# Patient Record
Sex: Female | Born: 1940 | Race: White | Hispanic: No | Smoking: Never smoker
Health system: Southern US, Community
[De-identification: ages and names within clinical notes are randomized; demographics above are authoritative.]

## PROBLEM LIST (undated history)

## (undated) DIAGNOSIS — M1990 Unspecified osteoarthritis, unspecified site: Secondary | ICD-10-CM

## (undated) DIAGNOSIS — F4310 Post-traumatic stress disorder, unspecified: Secondary | ICD-10-CM

## (undated) DIAGNOSIS — I509 Heart failure, unspecified: Secondary | ICD-10-CM

## (undated) DIAGNOSIS — M329 Systemic lupus erythematosus, unspecified: Secondary | ICD-10-CM

## (undated) DIAGNOSIS — I10 Essential (primary) hypertension: Secondary | ICD-10-CM

## (undated) DIAGNOSIS — IMO0002 Reserved for concepts with insufficient information to code with codable children: Secondary | ICD-10-CM

## (undated) DIAGNOSIS — I38 Endocarditis, valve unspecified: Secondary | ICD-10-CM

## (undated) DIAGNOSIS — M797 Fibromyalgia: Secondary | ICD-10-CM

## (undated) DIAGNOSIS — M069 Rheumatoid arthritis, unspecified: Secondary | ICD-10-CM

## (undated) DIAGNOSIS — K559 Vascular disorder of intestine, unspecified: Secondary | ICD-10-CM

## (undated) HISTORY — DX: Endocarditis, valve unspecified: I38

## (undated) HISTORY — DX: Reserved for concepts with insufficient information to code with codable children: IMO0002

## (undated) HISTORY — DX: Unspecified osteoarthritis, unspecified site: M19.90

## (undated) HISTORY — DX: Fibromyalgia: M79.7

## (undated) HISTORY — DX: Heart failure, unspecified: I50.9

## (undated) HISTORY — DX: Rheumatoid arthritis, unspecified: M06.9

## (undated) HISTORY — DX: Post-traumatic stress disorder, unspecified: F43.10

## (undated) HISTORY — DX: Systemic lupus erythematosus, unspecified: M32.9

## (undated) HISTORY — DX: Essential (primary) hypertension: I10

## (undated) HISTORY — PX: TONSILLECTOMY: SUR1361

---

## 2007-07-10 ENCOUNTER — Ambulatory Visit: Payer: Self-pay | Admitting: Internal Medicine

## 2007-10-05 ENCOUNTER — Ambulatory Visit: Payer: Self-pay | Admitting: Internal Medicine

## 2008-07-18 ENCOUNTER — Ambulatory Visit: Payer: Self-pay | Admitting: Pain Medicine

## 2008-08-02 ENCOUNTER — Ambulatory Visit: Payer: Self-pay | Admitting: Pain Medicine

## 2008-10-25 ENCOUNTER — Emergency Department: Payer: Self-pay | Admitting: Emergency Medicine

## 2008-10-26 ENCOUNTER — Ambulatory Visit: Payer: Self-pay | Admitting: Internal Medicine

## 2009-03-08 ENCOUNTER — Ambulatory Visit: Payer: Self-pay | Admitting: Pain Medicine

## 2009-03-21 ENCOUNTER — Ambulatory Visit: Payer: Self-pay | Admitting: Pain Medicine

## 2009-04-16 ENCOUNTER — Ambulatory Visit: Payer: Self-pay | Admitting: Pain Medicine

## 2009-08-13 ENCOUNTER — Ambulatory Visit: Payer: Self-pay | Admitting: Pain Medicine

## 2010-08-27 ENCOUNTER — Emergency Department: Payer: Self-pay | Admitting: Emergency Medicine

## 2010-10-17 ENCOUNTER — Emergency Department: Payer: Self-pay | Admitting: Emergency Medicine

## 2011-11-11 LAB — URINALYSIS, COMPLETE
Granular Cast: 2
Hyaline Cast: 22
Ketone: NEGATIVE
Nitrite: NEGATIVE
Ph: 5 (ref 4.5–8.0)
Protein: 30
Specific Gravity: 1.017 (ref 1.003–1.030)
WBC UR: 14 /HPF (ref 0–5)

## 2011-11-11 LAB — COMPREHENSIVE METABOLIC PANEL
Albumin: 3.6 g/dL (ref 3.4–5.0)
Anion Gap: 10 (ref 7–16)
BUN: 33 mg/dL — ABNORMAL HIGH (ref 7–18)
Bilirubin,Total: 0.4 mg/dL (ref 0.2–1.0)
Chloride: 104 mmol/L (ref 98–107)
Co2: 23 mmol/L (ref 21–32)
Creatinine: 1.36 mg/dL — ABNORMAL HIGH (ref 0.60–1.30)
EGFR (African American): 46 — ABNORMAL LOW
Glucose: 239 mg/dL — ABNORMAL HIGH (ref 65–99)
Potassium: 4.6 mmol/L (ref 3.5–5.1)
SGOT(AST): 19 U/L (ref 15–37)
Total Protein: 6.9 g/dL (ref 6.4–8.2)

## 2011-11-11 LAB — CBC
MCH: 31.9 pg (ref 26.0–34.0)
MCHC: 33.1 g/dL (ref 32.0–36.0)
Platelet: 313 10*3/uL (ref 150–440)
RBC: 4.63 10*6/uL (ref 3.80–5.20)
WBC: 29.2 10*3/uL — ABNORMAL HIGH (ref 3.6–11.0)

## 2011-11-11 LAB — APTT: Activated PTT: 26 secs (ref 23.6–35.9)

## 2011-11-11 LAB — PROTIME-INR: Prothrombin Time: 12.9 secs (ref 11.5–14.7)

## 2011-11-11 LAB — TROPONIN I: Troponin-I: <0.02 ng/mL

## 2011-11-12 ENCOUNTER — Inpatient Hospital Stay: Payer: Self-pay | Admitting: Internal Medicine

## 2011-11-12 LAB — BASIC METABOLIC PANEL
BUN: 20 mg/dL — ABNORMAL HIGH (ref 7–18)
Calcium, Total: 8.1 mg/dL — ABNORMAL LOW (ref 8.5–10.1)
Chloride: 108 mmol/L — ABNORMAL HIGH (ref 98–107)
Co2: 25 mmol/L (ref 21–32)
Creatinine: 0.96 mg/dL (ref 0.60–1.30)
EGFR (African American): >60
Glucose: 117 mg/dL — ABNORMAL HIGH (ref 65–99)
Sodium: 139 mmol/L (ref 136–145)

## 2011-11-12 LAB — CLOSTRIDIUM DIFFICILE BY PCR

## 2011-11-13 LAB — COMPREHENSIVE METABOLIC PANEL
Albumin: 2.6 g/dL — ABNORMAL LOW (ref 3.4–5.0)
Alkaline Phosphatase: 86 U/L (ref 50–136)
Anion Gap: 9 (ref 7–16)
BUN: 9 mg/dL (ref 7–18)
Calcium, Total: 7.9 mg/dL — ABNORMAL LOW (ref 8.5–10.1)
Co2: 23 mmol/L (ref 21–32)
Glucose: 135 mg/dL — ABNORMAL HIGH (ref 65–99)
Osmolality: 282 (ref 275–301)
Potassium: 4.1 mmol/L (ref 3.5–5.1)
SGPT (ALT): 13 U/L (ref 12–78)
Sodium: 141 mmol/L (ref 136–145)

## 2011-11-13 LAB — CBC WITH DIFFERENTIAL/PLATELET
Basophil %: 0.2 %
Eosinophil %: 0.6 %
Lymphocyte #: 2.2 10*3/uL (ref 1.0–3.6)
Lymphocyte %: 13.3 %
MCH: 31.5 pg (ref 26.0–34.0)
MCHC: 33.2 g/dL (ref 32.0–36.0)
Monocyte #: 1.3 x10 3/mm — ABNORMAL HIGH (ref 0.2–0.9)
Neutrophil #: 13.2 10*3/uL — ABNORMAL HIGH (ref 1.4–6.5)
Neutrophil %: 78.1 %
Platelet: 220 10*3/uL (ref 150–440)

## 2011-11-13 LAB — HEMOGLOBIN: HGB: 12.8 g/dL (ref 12.0–16.0)

## 2011-11-14 LAB — BASIC METABOLIC PANEL
Anion Gap: 7 (ref 7–16)
Chloride: 109 mmol/L — ABNORMAL HIGH (ref 98–107)
Co2: 25 mmol/L (ref 21–32)
Creatinine: 0.79 mg/dL (ref 0.60–1.30)
EGFR (African American): >60
Osmolality: 279 (ref 275–301)
Potassium: 3.5 mmol/L (ref 3.5–5.1)

## 2011-11-14 LAB — CBC WITH DIFFERENTIAL/PLATELET
Basophil %: 0.4 %
Eosinophil #: 0.3 10*3/uL (ref 0.0–0.7)
Eosinophil %: 2 %
HCT: 33.2 % — ABNORMAL LOW (ref 35.0–47.0)
HGB: 11 g/dL — ABNORMAL LOW (ref 12.0–16.0)
Lymphocyte #: 2.7 10*3/uL (ref 1.0–3.6)
Lymphocyte %: 18.4 %
MCHC: 33.2 g/dL (ref 32.0–36.0)
MCV: 95 fL (ref 80–100)
Monocyte %: 7.3 %
Neutrophil #: 10.6 10*3/uL — ABNORMAL HIGH (ref 1.4–6.5)
Neutrophil %: 71.9 %
RBC: 3.51 10*6/uL — ABNORMAL LOW (ref 3.80–5.20)
RDW: 12.7 % (ref 11.5–14.5)
WBC: 14.7 10*3/uL — ABNORMAL HIGH (ref 3.6–11.0)

## 2011-11-14 LAB — PROTIME-INR: Prothrombin Time: 14.5 secs (ref 11.5–14.7)

## 2011-11-15 LAB — CBC WITH DIFFERENTIAL/PLATELET
Basophil %: 0.4 %
HCT: 30.9 % — ABNORMAL LOW (ref 35.0–47.0)
HGB: 10.4 g/dL — ABNORMAL LOW (ref 12.0–16.0)
Lymphocyte #: 2.3 10*3/uL (ref 1.0–3.6)
Lymphocyte %: 20.4 %
Monocyte %: 6.8 %
Neutrophil #: 7.4 10*3/uL — ABNORMAL HIGH (ref 1.4–6.5)
Platelet: 217 10*3/uL (ref 150–440)
RDW: 12.3 % (ref 11.5–14.5)

## 2011-11-15 LAB — BASIC METABOLIC PANEL
Anion Gap: 11 (ref 7–16)
BUN: 5 mg/dL — ABNORMAL LOW (ref 7–18)
Creatinine: 0.65 mg/dL (ref 0.60–1.30)
EGFR (African American): >60
Glucose: 114 mg/dL — ABNORMAL HIGH (ref 65–99)

## 2011-11-19 LAB — BASIC METABOLIC PANEL
BUN: 11 mg/dL (ref 7–18)
Co2: 25 mmol/L (ref 21–32)
Glucose: 95 mg/dL (ref 65–99)
Sodium: 144 mmol/L (ref 136–145)

## 2011-11-19 LAB — CBC WITH DIFFERENTIAL/PLATELET
Basophil #: 0.1 10*3/uL (ref 0.0–0.1)
Basophil %: 0.5 %
Eosinophil #: 0.4 10*3/uL (ref 0.0–0.7)
Eosinophil %: 3.9 %
HCT: 30 % — ABNORMAL LOW (ref 35.0–47.0)
HGB: 10.4 g/dL — ABNORMAL LOW (ref 12.0–16.0)
Lymphocyte #: 2.3 10*3/uL (ref 1.0–3.6)
Lymphocyte %: 23.6 %
MCH: 33 pg (ref 26.0–34.0)
MCHC: 34.7 g/dL (ref 32.0–36.0)
Monocyte #: 0.8 x10 3/mm (ref 0.2–0.9)
Neutrophil #: 6.2 10*3/uL (ref 1.4–6.5)
Neutrophil %: 63.9 %
RDW: 12.8 % (ref 11.5–14.5)

## 2011-11-19 LAB — PATHOLOGY REPORT

## 2011-11-23 ENCOUNTER — Emergency Department: Payer: Self-pay | Admitting: Emergency Medicine

## 2011-11-23 LAB — COMPREHENSIVE METABOLIC PANEL
Anion Gap: 9 (ref 7–16)
BUN: 13 mg/dL (ref 7–18)
Calcium, Total: 8.4 mg/dL — ABNORMAL LOW (ref 8.5–10.1)
Chloride: 111 mmol/L — ABNORMAL HIGH (ref 98–107)
Co2: 25 mmol/L (ref 21–32)
EGFR (African American): >60
EGFR (Non-African Amer.): >60
Glucose: 134 mg/dL — ABNORMAL HIGH (ref 65–99)
Osmolality: 291 (ref 275–301)
Potassium: 3.7 mmol/L (ref 3.5–5.1)
Sodium: 145 mmol/L (ref 136–145)

## 2011-11-23 LAB — CBC
HGB: 12.8 g/dL (ref 12.0–16.0)
MCHC: 34.6 g/dL (ref 32.0–36.0)
RBC: 3.9 10*6/uL (ref 3.80–5.20)
WBC: 7.5 10*3/uL (ref 3.6–11.0)

## 2013-01-14 ENCOUNTER — Ambulatory Visit: Payer: Self-pay | Admitting: Internal Medicine

## 2013-07-05 ENCOUNTER — Ambulatory Visit: Payer: Self-pay | Admitting: Pain Medicine

## 2013-09-06 ENCOUNTER — Ambulatory Visit: Payer: Self-pay | Admitting: Pain Medicine

## 2013-09-12 ENCOUNTER — Ambulatory Visit: Payer: Self-pay | Admitting: Pain Medicine

## 2013-09-20 ENCOUNTER — Emergency Department: Payer: Self-pay | Admitting: Emergency Medicine

## 2013-09-20 LAB — COMPREHENSIVE METABOLIC PANEL
ALBUMIN: 3.4 g/dL (ref 3.4–5.0)
ALK PHOS: 104 U/L
ALT: 17 U/L
Anion Gap: 7 (ref 7–16)
BUN: 18 mg/dL (ref 7–18)
Bilirubin,Total: 0.3 mg/dL (ref 0.2–1.0)
CALCIUM: 8.8 mg/dL (ref 8.5–10.1)
Chloride: 108 mmol/L — ABNORMAL HIGH (ref 98–107)
Co2: 27 mmol/L (ref 21–32)
Creatinine: 0.86 mg/dL (ref 0.60–1.30)
EGFR (African American): >60
EGFR (Non-African Amer.): >60
Glucose: 92 mg/dL (ref 65–99)
Osmolality: 285 (ref 275–301)
Potassium: 3.8 mmol/L (ref 3.5–5.1)
SGOT(AST): 18 U/L (ref 15–37)
Sodium: 142 mmol/L (ref 136–145)
Total Protein: 7.1 g/dL (ref 6.4–8.2)

## 2013-09-20 LAB — CBC
HCT: 42.1 % (ref 35.0–47.0)
HGB: 13.4 g/dL (ref 12.0–16.0)
MCH: 30.8 pg (ref 26.0–34.0)
MCHC: 31.8 g/dL — AB (ref 32.0–36.0)
MCV: 97 fL (ref 80–100)
Platelet: 300 10*3/uL (ref 150–440)
RBC: 4.34 10*6/uL (ref 3.80–5.20)
RDW: 13.4 % (ref 11.5–14.5)
WBC: 9.7 10*3/uL (ref 3.6–11.0)

## 2013-09-20 LAB — TROPONIN I: Troponin-I: <0.02 ng/mL

## 2013-10-11 ENCOUNTER — Ambulatory Visit: Payer: Self-pay | Admitting: Pain Medicine

## 2013-10-25 ENCOUNTER — Emergency Department: Payer: Self-pay | Admitting: Emergency Medicine

## 2013-11-16 ENCOUNTER — Ambulatory Visit: Payer: Self-pay | Admitting: Pain Medicine

## 2013-11-16 LAB — PROTIME-INR
INR: 0.9
Prothrombin Time: 12.5 secs (ref 11.5–14.7)

## 2013-11-16 LAB — APTT: Activated PTT: 27.4 secs (ref 23.6–35.9)

## 2013-12-13 ENCOUNTER — Ambulatory Visit: Payer: Self-pay | Admitting: Pain Medicine

## 2013-12-14 ENCOUNTER — Ambulatory Visit: Payer: Self-pay | Admitting: Pain Medicine

## 2014-06-06 NOTE — Consult Note (Signed)
Chief Complaint:   Subjective/Chief Complaint doing well, tolerating low residue. no abdominal pain. one bm no bleeding   VITAL SIGNS/ANCILLARY NOTES: **Vital Signs.:   29-Sep-13 11:24   Vital Signs Type Q 4hr   Temperature Temperature (F) 99.2   Celsius 37.3   Temperature Source oral   Pulse Pulse 86   Respirations Respirations 16   Systolic BP Systolic BP 142   Diastolic BP (mmHg) Diastolic BP (mmHg) 83   Mean BP 102   Pulse Ox % Pulse Ox % 95   Pulse Ox Activity Level  At rest   Oxygen Delivery Room Air/ 21 %   Brief Assessment:   Cardiac Regular    Respiratory clear BS    Gastrointestinal details normal Soft  Nontender  Nondistended  No masses palpable  Bowel sounds normal   Assessment/Plan:  Assessment/Plan:   Assessment 1) hematochezia with abnormal ct.  Flexible sigmoidoscopy c/w apperance of ischemic colitis.  Awaiting biopsies.    Plan 1) will s/o.  Will need to see patient as op in about 2 weeks to arrange for o/p colonoscopy to be done in about 5 weeks.   Electronic Signatures: Barnetta ChapelSkulskie, Martin (MD)  (Signed 29-Sep-13 13:35)  Authored: Chief Complaint, VITAL SIGNS/ANCILLARY NOTES, Brief Assessment, Assessment/Plan   Last Updated: 29-Sep-13 13:35 by Barnetta ChapelSkulskie, Martin (MD)

## 2014-06-06 NOTE — Consult Note (Signed)
PATIENT NAME:  [name], [name] MR#:  [id number] DATE OF BIRTH:  [birth year]  DATE OF CONSULTATION:  11/12/2011  REFERRING PHYSICIAN:   CONSULTING PHYSICIAN:  Keturah Barrehristiane H. Satcha Storlie, NP  HISTORY OF PRESENT ILLNESS: Dawn [name] is a very pleasant 74 year old Caucasian woman who was admitted with colitis. Her history is significant for hypertension, arthritis, and anxiety. GI has been consulted at the request of Dr. Ellsworth Lennoxejan-Sie to evaluate her colitis. The patient states she was in her usual state of health until yesterday at 16:00. She states at that time she began to have lower abdominal cramping. She states calling EMS as she began to feel nauseated and faint, which improved a short time later and she did not come to the hospital at that time. She then began to pass red bloody stools, which became rather frequent, about every hour and started to have clots in it. She called her daughter and came to the ED with subsequent admission. She states now that the cramping has become less severe, is predominantly on the left side, and her stools and have slowed. Her last bowel movement was a little over two hours ago and described as small and bloody. Noncontrasted CT showed left-sided colitis. A C. difficile stool is pending as is a CT angiogram of the abdomen. Her hemoglobin is stable from admission. Appreciate Dr. Zackery BarefootHashmi's note.   PAST MEDICAL HISTORY: Depression, fibromyalgia, chronic back pain, arthritis.    PAST SURGICAL HISTORY: Tonsillectomy.   SOCIAL HISTORY: No history of tobacco, alcohol, or illicits. Lives at home.   FAMILY HISTORY: No family history of colorectal cancer, colon polyps, liver disease, or ulcers.   ALLERGIES: Penicillin and sulfa drugs.   HOME MEDICATIONS:  1. ASA 325 p.o. daily.  2. Centrum Silver 1 tab p.o. daily.  3. Gabapentin 300 mg p.o. b.i.d. 4. Indomethacin 1 tab p.r.n.  5. Lotrel 5/40, 1 tab p.o. daily.  6. Tramadol 50 mg 2 tabs b.i.d. p.r.n.  7. Effexor 150 mg p.o.  daily.  8. Vitamin B12 1 tab p.o. daily.   REVIEW OF SYSTEMS: Pertinent for mild chills, fatigue, chronic arthralgias, back pain, intermittent history of depression and anxiety. States this is well controlled. Otherwise ten-point review is negative.   LABORATORY DATA:   Most recent glucose 117, BUN 20, creatinine 0.96, sodium 139, potassium 4.9, GFR 60, calcium 8.1, hemoglobin 14.7. WBC 29.2, hematocrit 44.5, platelet count 313. Red cells are normocytic with normal distribution. Troponin less than 0.02. Noncontrasted CT of abdomen and pelvis demonstrating colitis involving the distal, transverse, descending, and rectosigmoid colon. No evidence of obstruction, perforation, or abscess. There is a left inguinal hernia and a likely fibroid in the left aspect of the uterus. There are no other abnormalities.   PHYSICAL EXAMINATION:  VITAL SIGNS: Most recent vital signs: Temperature 99, pulse 94, respiratory rate 20, blood pressure 113/64.   GENERAL: Well-appearing, pleasant Caucasian woman lying in bed, appears comfortable.   HEENT: Normocephalic, atraumatic. No erythema or drainage to the eyes or the nares. Oral mucous membranes are pink and moist.   NECK: Supple. No thyromegaly, JVD, or lymphadenopathy.   CHEST: Respirations eupneic. Lungs clear to auscultation and percussion.    CARDIAC: S1, S2, regular rate and rhythm. No murmurs, rubs, or gallops. Peripheral pulses 2+. No appreciable edema.   ABDOMEN: Protuberant abdomen. Bowel sounds times four. Soft. Mild tenderness to the left lower quadrant. No guarding, rebound tenderness, peritoneal signs, hepatosplenomegaly, masses or other abnormalities.   RECTAL: Deferred.   GENITOURINARY: Deferred.  EXTREMITIES: Moves all extremities well times four. Strength five out of five. No clubbing or cyanosis.   NEUROLOGIC: Alert and oriented times three. Cranial nerves II through XII grossly intact. Speech clear. No facial droop.   PSYCHIATRIC:  Pleasant, cooperative, good memory skills. Logical thought.   IMPRESSION AND PLAN: Colitis, concerning for ischemic colitis versus other. The patient is on Flagyl currently and has stool studies/CTA abdomen pending, feeling better. We will additionally recommend sedated flexible sigmoidoscopy when feasible in the next couple of days.   These services were provided by Vevelyn Pat, MSN, NPC in collaboration with Dr. Barnetta Chapel, M.D. with whom I have discussed this patient in full.    ____________________________ Keturah Barre, NP chl:bjt D: 11/13/2011 12:39:34 ET T: 11/13/2011 13:17:09 ET JOB#: [id number]  cc: Keturah Barre, NP, <Dictator> Eustaquio Maize Athene Schuhmacher FNP ELECTRONICALLY SIGNED 11/16/2011 8:20

## 2014-06-06 NOTE — Consult Note (Signed)
Chief Complaint:   Subjective/Chief Complaint mild nausea this am, less abdominal pain, stool watery, brown not bloody   VITAL SIGNS/ANCILLARY NOTES: **Vital Signs.:   27-Sep-13 11:38   Vital Signs Type Routine   Temperature Temperature (F) 98.5   Celsius 36.9   Pulse Pulse 85   Respirations Respirations 20   Systolic BP Systolic BP 161   Diastolic BP (mmHg) Diastolic BP (mmHg) 63   Mean BP 80   Pulse Ox % Pulse Ox % 94   Pulse Ox Activity Level  At rest   Oxygen Delivery Room Air/ 21 %   Brief Assessment:   Cardiac Regular    Respiratory clear BS    Gastrointestinal details normal Soft  Nontender  Nondistended  No masses palpable  Bowel sounds normal  minimal discomfort llq   Lab Results:  Routine Chem:  27-Sep-13 05:12    Glucose, Serum 99   BUN  6   Creatinine (comp) 0.79   Sodium, Serum 141   Potassium, Serum 3.5   Chloride, Serum  109   CO2, Serum 25   Calcium (Total), Serum  7.9   Anion Gap 7   Osmolality (calc) 279   eGFR (African American) >60   eGFR (Non-African American) >60 (eGFR values <43m/min/1.73 m2 may be an indication of chronic kidney disease (CKD). Calculated eGFR is useful in patients with stable renal function. The eGFR calculation will not be reliable in acutely ill patients when serum creatinine is changing rapidly. It is not useful in  patients on dialysis. The eGFR calculation may not be applicable to patients at the low and high extremes of body sizes, pregnant women, and vegetarians.)  Routine Coag:  27-Sep-13 05:12    Prothrombin 14.5   INR 1.1 (INR reference interval applies to patients on anticoagulant therapy. A single INR therapeutic range for coumarins is not optimal for all indications; however, the suggested range for most indications is 2.0 - 3.0. Exceptions to the INR Reference Range may include: Prosthetic heart valves, acute myocardial infarction, prevention of myocardial infarction, and combinations of aspirin and  anticoagulant. The need for a higher or lower target INR must be assessed individually. Reference: The Pharmacology and Management of the Vitamin K  antagonists: the seventh ACCP Conference on Antithrombotic and Thrombolytic Therapy. CWRUEA.5409Sept:126 (3suppl): [id number] A HCT value >55% may artifactually increase the PT.  In one study,  the increase was an average of 25%. Reference:  "Effect on Routine and Special Coagulation Testing Values of Citrate Anticoagulant Adjustment in Patients with High HCT Values." American Journal of Clinical Pathology 2006;126:400-405.)  Routine Hem:  27-Sep-13 05:12    WBC (CBC)  14.7   RBC (CBC)  3.51   Hemoglobin (CBC)  11.0   Hematocrit (CBC)  33.2   Platelet Count (CBC) 211   MCV 95   MCH 31.5   MCHC 33.2   RDW 12.7   Neutrophil % 71.9   Lymphocyte % 18.4   Monocyte % 7.3   Eosinophil % 2.0   Basophil % 0.4   Neutrophil #  10.6   Lymphocyte # 2.7   Monocyte #  1.1   Eosinophil # 0.3   Basophil # 0.1 (Result(s) reported on 14 Nov 2011 at 06:40AM.)   Assessment/Plan:  Assessment/Plan:   Assessment 1) rectasl bleeding, possible colitis on ct.  symptoms much improved.  Vascular consult noted.    Plan 1) flexible sigmoidoscopy today, further recs to follow.  I have discussed the risks benefits and complications  of flex sig to include not limited to bleeding infection perforation and sedation and she wishes to proceed.   Electronic Signatures: Loistine Simas (MD)  (Signed 27-Sep-13 15:24)  Authored: Chief Complaint, VITAL SIGNS/ANCILLARY NOTES, Brief Assessment, Lab Results, Assessment/Plan   Last Updated: 27-Sep-13 15:24 by Loistine Simas (MD)

## 2014-06-06 NOTE — H&P (Signed)
PATIENT NAME:  Dawn [name], [name] MR#:  [id number] DATE OF BIRTH:  December 13, 1940  DATE OF ADMISSION:  11/12/2011  REFERRING PHYSICIAN: Gaetano NetKevin Boland, MD   PRIMARY CARE PHYSICIAN: Yves DillNeelam Khan, MD    CHIEF COMPLAINT: Bright red blood per rectum, abdominal pain.   HISTORY OF PRESENT ILLNESS: This is a 74 year old female with significant past medical history of depression, fibromyalgia, and chronic back pain who presents with complaints of abdominal pain and bright red blood per rectum. The patient reports she has history of irritable bowel syndrome, reports she had sudden onset of abdominal pain starting at 4 p.m. yesterday, and reports was followed by episodes of bowel movements with bright red blood per rectum which was x3. The patient came to the ED where she was Hemoccult positive. As well, the patient did not have any drop in her hemoglobin with hemoglobin of 14.7 but the patient had significant leukocytosis of 29,000. The patient had CT abdomen and pelvis done without IV contrast secondary to worsening renal function. CT of abdomen came back showing evidence of colitis, ischemic versus infection. The patient reports abdominal pain is constant with no relieving or provoking factors. Initially it is related to irritable bowel syndrome. The patient had elevated lactic acid to 1.5 as well with worsening renal function with GFR of 39. The patient denies any chest pain, shortness of breath, hemoptysis, coffee-ground emesis, nausea, or vomiting. The patient reports she never had any EGD or endoscopy done before.   PAST MEDICAL HISTORY: 1. Depression.  2. Fibromyalgia.  3. Chronic back pain.   PAST SURGICAL HISTORY: Tonsillectomy at young age.   SOCIAL HISTORY: The patient denies any history of smoking, drinking alcohol, or illicit drug use.   FAMILY HISTORY: The patient denies any family history of diabetes or cardiac disease.   ALLERGIES: Penicillin and sulfa drugs.   HOME MEDICATIONS:  1. Aspirin 325  mg oral daily.  2. Centrum Silver 1 tablet oral daily.  3. Gabapentin 300 mg oral 2 times a day.  4. Indomethacin 1 tablet oral daily as needed.  5. Lotrel 5 mg/40 mg 1 tablet oral daily.  6. Tramadol 50 mg oral 2 tablets two times a day as needed.  7. Venlafaxine 150 mg oral daily.  8. Vitamin B12 1 tablet oral daily.   REVIEW OF SYSTEMS: The patient denies any fever but has complaints of chills. Complains of fatigue and weakness. EYES: Denies blurry vision, double vision or pain. ENT: Denies tinnitus, ear pain, hearing loss. RESPIRATORY: Denies cough, wheezing, hemoptysis, dyspnea. CARDIOVASCULAR: Denies chest pain, orthopnea, edema, arrhythmia, palpitation. GI: Denies nausea, vomiting, diarrhea. Has complaints of abdominal pain, bright red blood per rectum. Denies any hematemesis. GU: Denies dysuria, hematuria, renal colic. ENDOCRINE: Denies polyuria, polydipsia, heat or cold intolerance. HEMATOLOGY: Denies anemia, easy bruising, or bleeding diathesis. INTEGUMENTARY: Denies acne, rash, or lesions. MUSCULOSKELETAL: Denies any gout, joint swelling, cramps, or redness. NEUROLOGIC: Denies numbness, weakness, dysarthria, epilepsy, tremors, or vertigo. PSYCH: Has history of depression and anxiety. No history of substance or alcohol abuse.   PHYSICAL EXAMINATION:   VITAL SIGNS: Temperature 96, pulse 78, respiratory rate 20, blood pressure 125/57, saturating 96% on room air.   GENERAL: Well nourished female comfortable in bed in no apparent distress.   HEENT: Head normocephalic. Pupils equal, reactive to light. Pink conjunctivae. Anicteric sclerae. Moist oral mucosa.   NECK: Supple. No thyromegaly. No JVD.   CHEST: Good air entry bilaterally. No wheezing, rales, or rhonchi.   CARDIOVASCULAR: S1, S2 heard. No  rubs, murmur, or gallops.   ABDOMEN: Obese. Mild tenderness mainly in the left side but no rebound. No guarding. No hepatosplenomegaly. Bowel sounds present.   EXTREMITIES: No edema. No  clubbing. No cyanosis.   PSYCHIATRIC: Appropriate affect. Awake, alert x3. Intact judgment and insight.   NEUROLOGIC: Cranial nerves grossly intact. Motor 5 out of 5. Sensation symmetrical and intact.   SKIN: Warm and dry. Normal skin turgor.   LABORATORY, DIAGNOSTIC, AND RADIOLOGICAL DATA: EKG showing normal sinus rhythm with ventricular rate of 76.   Glucose 239, BUN 33, creatinine 1.36, sodium 137, potassium 4.6, chloride 104, CO2 23. Troponin less than 0.02. White blood cells 29.2, hemoglobin 14.7, hematocrit 44.5, platelets 313.  Urinalysis showing leukocyte esterase +2 with 14 white blood cells in urine. Lactic acid 1.5.   CT abdomen and pelvis with no IV contrast showing very mild infiltrate in the periphery of the lower lung and extensive mucosal thickening in the colon from distal transverse colon to the sigmoid colon with distribution highly suggestive of ischemic colitis. Infectious colitis is not entirely excluded.   ASSESSMENT AND PLAN:  1. Abdominal pain, bright red blood per rectum with leukocytosis. The patient has evidence of lower GI bleed but her hemoglobin and hematocrit is stable at this point. Has never had colonoscopy before. The patient's findings are suspicious of ischemic colitis especially with elevated lactic acid and CT abdomen and pelvis finding especially with colitis distribution. The patient will need to have CT angiogram to evaluate for mesenteric ischemia. Will hydrate the patient appropriately and will repeat BMP in a.m. If her acute renal failure resolves, will have CT angiogram. Will consult GI service. Will check hemoglobin and hematocrit every eight hours and will start on IV Protonix 40 mg p.o. b.i.d. Will continue on IV Levaquin and Flagyl.  2. Urinary tract infection. Will follow urine culture. Meanwhile, will continue with Levaquin.  3. Acute renal failure. This is most likely due to volume depletion/prerenal azotemia. Will continue with IV fluid boluses.  So far has received 1 liter in ED. Will give another half liter and then will continue on 150 mL per hour.  4. Hypertension. Will hold home medications which can be resumed when patient is more stable.  5. Depression. Will resume Venlafaxine when patient is more stable.  6. DVT secondary to the patient's bright red blood per rectum. Will hold all chemical anticoagulation. Will continue with sequential compression device. 7. GI prophylaxis. Continue with IV Protonix.   CODE STATUS: The patient is FULL CODE.   TOTAL TIME SPENT ON ADMISSION AND PATIENT CARE: 60 minutes.   ____________________________ Starleen Arms, MD dse:drc D: 11/12/2011 06:15:30 ET T: 11/12/2011 11:15:18 ET JOB#: [id number]  cc: Starleen Arms, MD, <Dictator> Margaretann Loveless, MD Adekunle Rohrbach Teena Irani MD ELECTRONICALLY SIGNED 11/13/2011 0:20

## 2014-06-06 NOTE — Consult Note (Signed)
Chief Complaint:   Subjective/Chief Complaint no abdominal pain or nausea, no bm overnight.  tolerating full liquids   VITAL SIGNS/ANCILLARY NOTES: **Vital Signs.:   28-Sep-13 11:16   Vital Signs Type Q 4hr   Temperature Temperature (F) 98.4   Celsius 36.8   Temperature Source oral   Pulse Pulse 72   Respirations Respirations 16   Systolic BP Systolic BP 462   Diastolic BP (mmHg) Diastolic BP (mmHg) 71   Mean BP 85   Pulse Ox % Pulse Ox % 96   Pulse Ox Activity Level  At rest   Oxygen Delivery Room Air/ 21 %   Brief Assessment:   Cardiac Regular    Respiratory clear BS    Gastrointestinal details normal Soft  Nontender  Nondistended  No masses palpable  Bowel sounds normal   Lab Results: Routine Chem:  28-Sep-13 04:34    Glucose, Serum  114   BUN  5   Creatinine (comp) 0.65   Sodium, Serum 144   Potassium, Serum 3.6   Chloride, Serum  112   CO2, Serum 21   Calcium (Total), Serum  7.9   Anion Gap 11   Osmolality (calc) 285   eGFR (African American) >60   eGFR (Non-African American) >60 (eGFR values <79m/min/1.73 m2 may be an indication of chronic kidney disease (CKD). Calculated eGFR is useful in patients with stable renal function. The eGFR calculation will not be reliable in acutely ill patients when serum creatinine is changing rapidly. It is not useful in  patients on dialysis. The eGFR calculation may not be applicable to patients at the low and high extremes of body sizes, pregnant women, and vegetarians.)  Routine Hem:  28-Sep-13 04:34    WBC (CBC)  11.1   RBC (CBC)  3.26   Hemoglobin (CBC)  10.4   Hematocrit (CBC)  30.9   Platelet Count (CBC) 217   MCV 95   MCH 32.0   MCHC 33.7   RDW 12.3   Neutrophil % 67.0   Lymphocyte % 20.4   Monocyte % 6.8   Eosinophil % 5.4   Basophil % 0.4   Neutrophil #  7.4   Lymphocyte # 2.3   Monocyte # 0.8   Eosinophil # 0.6   Basophil # 0.0 (Result(s) reported on 15 Nov 2011 at 05:25AM.)   Assessment/Plan:   Assessment/Plan:   Assessment 1) rectal bleeding and abnormal ct, flex sig consistant with sichemic colitis.  much improved symptomatically.    Plan 1) will advance diet to low residue in am.  continue for a week before going back to regular diet.  GI fu in about 2-3 w as o/p.  Will need to arrange for  colonoscopy at that time.  2) finish 7-10 day course of abx.  3) awaiting biopsies-can fu as o/p.   Electronic Signatures: SLoistine Simas(MD)  (Signed 28-Sep-13 12:38)  Authored: Chief Complaint, VITAL SIGNS/ANCILLARY NOTES, Brief Assessment, Lab Results, Assessment/Plan   Last Updated: 28-Sep-13 12:38 by SLoistine Simas(MD)

## 2014-06-06 NOTE — Consult Note (Signed)
Brief Consult Note: Diagnosis: ischemic colitis of left colon.   Patient was seen by consultant.   Consult note dictated.   Recommend further assessment or treatment.   Comments: pt seen .i think she has ischimic colitis but abdominal findings do not show gaurding .WBC is elevate and so does lactic acidoses .pt is being seen by GI now and after their evaluation decsion of surgery will be taken also will nee c Difficile needs to be done.Discussed the case with radiologist today and GI.  Electronic Signatures: Jovita GammaHashmi, Robertha Staples (MD)  (Signed 25-Sep-13 11:37)  Authored: Brief Consult Note   Last Updated: 25-Sep-13 11:37 by Jovita GammaHashmi, Demarqus Jocson (MD)

## 2014-06-06 NOTE — Consult Note (Signed)
Chief Complaint:   Subjective/Chief Complaint Patient seen and examined, please see full GI consult.  Patietn presenting with left abdominal pain followed by rectal bleeding. She had a ct showing colitis, infective versus inflammatory versus ischemic.  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 IMA patent in distrubution of apparent colitis.  Recommend a vascular surgery consult for opinion on further evaluation and treatment.  If verification of ischemic colitis is needed prior to consideration of angigram, I will plan a flex sig to obtain biopsies  probably friday. Also awaiting stool studies for infection.   Discussed with Dr Verlon AuHashimi.   VITAL SIGNS/ANCILLARY NOTES: **Vital Signs.:   25-Sep-13 16:06   Vital Signs Type Routine   Temperature Temperature (F) 98.9   Celsius 37.1   Temperature Source Oral   Pulse Pulse 94   Respirations Respirations 18   Systolic BP Systolic BP 129   Diastolic BP (mmHg) Diastolic BP (mmHg) 73   Mean BP 91   Pulse Ox % Pulse Ox % 94   Pulse Ox Activity Level  At rest   Oxygen Delivery Room Air/ 21 %   Electronic Signatures: Barnetta ChapelSkulskie, Martin (MD)  (Signed 25-Sep-13 17:26)  Authored: Chief Complaint, VITAL SIGNS/ANCILLARY NOTES   Last Updated: 25-Sep-13 17:26 by Barnetta ChapelSkulskie, Martin (MD)

## 2014-06-06 NOTE — Consult Note (Signed)
Chief Complaint:   Subjective/Chief Complaint abdominal pain and bloody diarohea.pt statred crampy abdominal pain last night which changed to bloody bowel movements no colonoscopy in the past   VITAL SIGNS/ANCILLARY NOTES: **Vital Signs.:   25-Sep-13 07:49   Vital Signs Type Admission   Temperature Temperature (F) 99.3   Celsius 37.3   Temperature Source Oral   Pulse Pulse 78   Respirations Respirations 18   Systolic BP Systolic BP 382   Diastolic BP (mmHg) Diastolic BP (mmHg) 76   Mean BP 99   Pulse Ox % Pulse Ox % 96   Pulse Ox Activity Level  At rest   Oxygen Delivery Room Air/ 21 %  *Intake and Output.:   25-Sep-13 07:50   Grand Totals Intake:   Output:      Net:   1 Hr.:     Weight Type admission   Weight Method Bed   Current Weight (lbs) (lbs) 207.9   Current Weight (kg) (kg) 94.3   Height (ft) (feet) 5   Height (in) (in) 4   Height (cm) centimeters 162.5   BSA (m2) 1.9   BMI (kg/m2) 35.7    09:06   Grand Totals Intake:   Output:  150    Net:  -150 24 Hr.:  -150   Urine ml     Out:  150    09:41   Grand Totals Intake:   Output:  60    Net:  -60 24 Hr.:  -210   Urine ml     Out:  60   Urinary Method  Void; BSC   Stool  extra small, loose, bloody BM    Shift 15:00   Grand Totals Intake:   Output:  210    Net:  -505 39 Hr.:  -210   Urine ml     Out:  210   Length of Stay Totals Intake:   Output:  210    Net:  -210   Brief Assessment:   Cardiac Regular    Respiratory normal resp effort    Gastrointestinal details normal Soft  Bowel sounds normal  No rebound tenderness  No gaurding  No rigidity  No organomegaly    Additional Physical Exam pt abdomen is tender but mildly mostly in the left side no gaurding   Lab Results: Hepatic:  24-Sep-13 20:23    Bilirubin, Total 0.4   Alkaline Phosphatase 128   SGPT (ALT) 21   SGOT (AST) 19   Total Protein, Serum 6.9   Albumin, Serum 3.6  Routine BB:  24-Sep-13 20:38    ABO Group + Rh Type O  Positive   Antibody Screen NEGATIVE (Result(s) reported on 11 Nov 2011 at 09:55PM.)  Lab:  25-Sep-13 02:38    Lactic Acid, Cardiopulmonary  1.5 (Result(s) reported on 12 Nov 2011 at 03:09AM.)  Routine Chem:  24-Sep-13 20:23    Glucose, Serum  239   BUN  33   Creatinine (comp)  1.36   Sodium, Serum 137   Potassium, Serum 4.6   Chloride, Serum 104   CO2, Serum 23   Calcium (Total), Serum 9.1   Anion Gap 10   Osmolality (calc) 289   eGFR (African American)  46   eGFR (Non-African American)  39 (eGFR values <68m/min/1.73 m2 may be an indication of chronic kidney disease (CKD). Calculated eGFR is useful in patients with stable renal function. The eGFR calculation will not be reliable in acutely ill patients when serum creatinine is changing rapidly.  It is not useful in  patients on dialysis. The eGFR calculation may not be applicable to patients at the low and high extremes of body sizes, pregnant women, and vegetarians.)  25-Sep-13 09:52    Glucose, Serum  117   BUN  20   Creatinine (comp) 0.96   Sodium, Serum 139   Potassium, Serum 4.9   Chloride, Serum  108   CO2, Serum 25   Calcium (Total), Serum  8.1   Anion Gap  6   Osmolality (calc) 281   eGFR (African American) >60   eGFR (Non-African American)  60 (eGFR values <79m/min/1.73 m2 may be an indication of chronic kidney disease (CKD). Calculated eGFR is useful in patients with stable renal function. The eGFR calculation will not be reliable in acutely ill patients when serum creatinine is changing rapidly. It is not useful in  patients on dialysis. The eGFR calculation may not be applicable to patients at the low and high extremes of body sizes, pregnant women, and vegetarians.)  Cardiac:  24-Sep-13 20:23    Troponin I < 0.02 (0.00-0.05 0.05 ng/mL or less: NEGATIVE  Repeat testing in 3-6 hrs  if clinically indicated. >0.05 ng/mL: POTENTIAL  MYOCARDIAL INJURY. Repeat  testing in 3-6 hrs if  clinically  indicated. NOTE: An increase or decrease  of 30% or more on serial  testing suggests a  clinically important change)  Routine UA:  24-Sep-13 22:45    Color (UA) Amber   Clarity (UA) Cloudy   Glucose (UA) Negative   Bilirubin (UA) Negative   Ketones (UA) Negative   Specific Gravity (UA) 1.017   Blood (UA) 1+   pH (UA) 5.0   Protein (UA) 30 mg/dL   Nitrite (UA) Negative   Leukocyte Esterase (UA) 2+ (Result(s) reported on 11 Nov 2011 at 11:12PM.)   RBC (UA) 16 /HPF   WBC (UA) 14 /HPF   Bacteria (UA) TRACE   Epithelial Cells (UA) 8 /HPF   Mucous (UA) PRESENT   Hyaline Cast (UA) 22 /LPF   Granular Cast (UA) 2 /LPF (Result(s) reported on 11 Nov 2011 at 11:12PM.)  Routine Coag:  24-Sep-13 20:23    Activated PTT (APTT) 26.0 (A HCT value >55% may artifactually increase the APTT. In one study, the increase was an average of 19%. Reference: "Effect on Routine and Special Coagulation Testing Values of Citrate Anticoagulant Adjustment in Patients with High HCT Values." American Journal of Clinical Pathology 2006;126:400-405.)   Prothrombin 12.9   INR 0.9 (INR reference interval applies to patients on anticoagulant therapy. A single INR therapeutic range for coumarins is not optimal for all indications; however, the suggested range for most indications is 2.0 - 3.0. Exceptions to the INR Reference Range may include: Prosthetic heart valves, acute myocardial infarction, prevention of myocardial infarction, and combinations of aspirin and anticoagulant. The need for a higher or lower target INR must be assessed individually. Reference: The Pharmacology and Management of the Vitamin K  antagonists: the seventh ACCP Conference on Antithrombotic and Thrombolytic Therapy. [id number] (3suppl): [id number] A HCT value >55% may artifactually increase the PT.  In one study,  the increase was an average of 25%. Reference:  "Effect on Routine and Special Coagulation Testing Values of  Citrate Anticoagulant Adjustment in Patients with High HCT Values." American Journal of Clinical Pathology 2006;126:400-405.)  Routine Hem:  24-Sep-13 20:23    WBC (CBC)  29.2   RBC (CBC) 4.63   Hemoglobin (CBC) 14.7   Hematocrit (CBC) 44.5  Platelet Count (CBC) 313 (Result(s) reported on 11 Nov 2011 at 08:41PM.)   MCV 96   MCH 31.9   MCHC 33.1   RDW 12.7   Assessment/Plan:  Invasive Device Daily Assessment of Necessity:   Does the patient currently have any of the following indwelling devices? 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

## 2014-06-06 NOTE — Consult Note (Signed)
Patient admitted with significant colitis.  Had a ct scan with contrast done which I have reviewed.  This shows absolutely no atherosclerosis that I see.  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  The SMA and the IMA are widely patent.  She has no signs of chronic visceral ischemia, and no intervention is necessary in the vast majority of cases of arcuate ligament compression. The celiac flow has no bearing on the colon, and this is not the cause of her colitis.  If her colitis is ischemic colitis, this is from small vessel disease and not major vascular disease, and no further vascular evaluation or intervention is necessary.  Will sign off, please call with questions.  Electronic Signatures: Annice Needyew, Zhamir Pirro S (MD)  (Signed on 26-Sep-13 16:30)  Authored  Last Updated: 26-Sep-13 16:30 by Annice Needyew, Mckoy Bhakta S (MD)

## 2014-06-06 NOTE — Discharge Summary (Signed)
PATIENT NAME:  Dawn [name], Ohana M MR#:  [id number] DATE OF BIRTH:  Sep 26, 1940  DATE OF ADMISSION:  11/12/2011 DATE OF DISCHARGE:  11/19/2011  DISCHARGE DIAGNOSES:  1. Ischemic colitis.  2. Blood loss anemia.   PROCEDURES:  1. Flexible sigmoidoscopy. 2. CT angiogram of abdomen and pelvis. 3. CT of the abdomen and pelvis.   CONSULTANTS:  1. Barnetta ChapelMartin Skulskie, MD - Gastroenterology. 2. Jovita GammaMasud Hashmi, MD - General Surgery. 3. Festus BarrenJason Dew, MD - Vascular Surgery.   HOSPITAL COURSE: This 74 year old lady was admitted through the Emergency Room with bright red blood per rectum. Please refer to the history and physician for full details. She had a lower GI bleed. Initial CT scan in the Emergency Room suggested colitis, possibly ischemic. She was admitted to the medical floor with monitoring. Her presenting hemoglobin was 14 and was 10 at the time of discharge. She did not require a transfusion as the patient never demonstrated signs of hemodynamic instability. Gastroenterology consultation was placed with Dr. Marva PandaSkulskie. She underwent a CTA which suggested celiac artery narrowing. However, vascular surgery evaluated her and determined that this was not the cause of her ischemic colitis and may have been due to a band occlusion of an artery.  They did not recommend surgical intervention at this time. The patient'Eulla Kochanowski bleeding resolved. Prior to that, she underwent flexible sigmoidoscope which confirmed colitis. She was screened for Clostridium difficile, which was negative, but she was placed on broad-spectrum antibiotics to treat possible infection. The patient'Haze Antillon white cell count was normal throughout and she remained afebrile. Her hospital stay was otherwise uncomplicated. She patient is being discharged to home in satisfactory condition.   DIET: Low sodium.   ACTIVITY: As tolerated.   DISCHARGE FOLLOWUP: Follow-up with Dr. Marva PandaSkulskie in 2 weeks and Dr. Bluford MainSheikh Tejan-Sie in 1 to 2 weeks.   DISCHARGE MEDICATIONS:   1. Levaquin 500 mg daily.  RESUME HOME MEDICATIONS: 2. Lotrel 5/40 mg daily. 3. Effexor 150 mg extended release daily. 4. Aspirin 325 mg daily.  5. Indomethacin 50 mg p.r.n. daily for pain.  6. Tramadol 50 mg 2 tabs twice a day p.r.n.  7. Augmentin 300 mg one to two daily p.r.n. for restless legs. 8. Vitamin B12 daily.  9. Centrum Silver daily.  DISCHARGE PROCESS TIME SPENT: 35 minutes. ____________________________ Silas FloodSheikh A. Ellsworth Lennoxejan-Sie, MD sat:slb D: 11/19/2011 13:56:44 ET T: 11/19/2011 16:04:07 ET JOB#: [id number]  cc: Marland McalpineSheikh A. Ellsworth Lennoxejan-Sie, MD, <Dictator> Charlesetta GaribaldiSHEIKH A TEJAN-SIE MD ELECTRONICALLY SIGNED 11/20/2011 9:30

## 2014-06-06 NOTE — Consult Note (Signed)
Chief Complaint:   Subjective/Chief Complaint continues some rectal bleeding today, but less amount, now beginning to be more brown.  abd pain 3/10, improving. no nausea, tolerting clears.   VITAL SIGNS/ANCILLARY NOTES: **Vital Signs.:   26-Sep-13 15:40   Vital Signs Type Routine   Temperature Temperature (F) 97.3   Celsius 36.2   Temperature Source oral   Pulse Pulse 91   Respirations Respirations 18   Systolic BP Systolic BP 540   Diastolic BP (mmHg) Diastolic BP (mmHg) 78   Mean BP 93   Pulse Ox % Pulse Ox % 98   Pulse Ox Activity Level  At rest   Oxygen Delivery Room Air/ 21 %   Brief Assessment:   Cardiac Regular    Respiratory clear BS    Gastrointestinal details normal Soft  Nondistended  No masses palpable  Bowel sounds normal  mild tenderness generalized, mor noted in llq and rlq   Lab Results: Hepatic:  26-Sep-13 04:11    Bilirubin, Total 0.5   Alkaline Phosphatase 86   SGPT (ALT) 13   SGOT (AST)  13   Total Protein, Serum  5.5   Albumin, Serum  2.6  Routine Chem:  26-Sep-13 04:11    Glucose, Serum  135   BUN 9   Creatinine (comp) 0.89   Sodium, Serum 141   Potassium, Serum 4.1   Chloride, Serum  109   CO2, Serum 23   Calcium (Total), Serum  7.9   Osmolality (calc) 282   eGFR (African American) >60   eGFR (Non-African American) >60 (eGFR values <6m/min/1.73 m2 may be an indication of chronic kidney disease (CKD). Calculated eGFR is useful in patients with stable renal function. The eGFR calculation will not be reliable in acutely ill patients when serum creatinine is changing rapidly. It is not useful in  patients on dialysis. The eGFR calculation may not be applicable to patients at the low and high extremes of body sizes, pregnant women, and vegetarians.)   Anion Gap 9   Magnesium, Serum  1.4 (1.8-2.4 THERAPEUTIC RANGE: 4-7 mg/dL TOXIC: > 10 mg/dL  -----------------------)  Routine Hem:  26-Sep-13 03:27    Hemoglobin (CBC) 12.8 (Result(s)  reported on 13 Nov 2011 at 04:22AM.)    04:11    Hemoglobin (CBC)  11.9   WBC (CBC)  16.9   RBC (CBC)  3.79   Hematocrit (CBC) 36.0   Platelet Count (CBC) 220   MCV 95   MCH 31.5   MCHC 33.2   RDW 12.8   Neutrophil % 78.1   Lymphocyte % 13.3   Monocyte % 7.8   Eosinophil % 0.6   Basophil % 0.2   Neutrophil #  13.2   Lymphocyte # 2.2   Monocyte #  1.3   Eosinophil # 0.1   Basophil # 0.0 (Result(s) reported on 13 Nov 2011 at 05:25AM.)    10:20    Hemoglobin (CBC) 12.2 (Result(s) reported on 13 Nov 2011 at 11:38AM.)   Assessment/Plan:  Assessment/Plan:   Assessment 1) abnormal ct  of the abdomen with colitis.  rectal bleeding.  c/w ischemic cilitis episode.  pain improving, bleeding less/older in appearance.    Plan 1) awaiting vascular surgery consult. 2) will arrange for flexible sigmoidoscopy under mild  sedation for tomorrow pm.  I have discussed the risks benefits and complications of  flex sig to include not limited to bleeding infection perforation and sedation and she wishes to proceed.   Electronic Signatures: SLoistine Simas(MD)  (  Signed 26-Sep-13 16:32)  Authored: Chief Complaint, VITAL SIGNS/ANCILLARY NOTES, Brief Assessment, Lab Results, Assessment/Plan   Last Updated: 26-Sep-13 16:32 by Loistine Simas (MD)

## 2014-06-06 NOTE — Consult Note (Signed)
Chief Complaint:   Subjective/Chief Complaint feeling better   VITAL SIGNS/ANCILLARY NOTES: **Vital Signs.:   27-Sep-13 04:26   Vital Signs Type Routine   Temperature Temperature (F) 98.3   Celsius 36.8   Temperature Source Oral   Pulse Pulse 93   Respirations Respirations 18   Systolic BP Systolic BP 105   Diastolic BP (mmHg) Diastolic BP (mmHg) 63   Mean BP 77   Pulse Ox % Pulse Ox % 96   Pulse Ox Activity Level  At rest   Oxygen Delivery Room Air/ 21 %    07:57   Vital Signs Type Routine   Temperature Temperature (F) 98.2   Celsius 36.7   Temperature Source oral   Pulse Pulse 87   Respirations Respirations 20   Systolic BP Systolic BP 123   Diastolic BP (mmHg) Diastolic BP (mmHg) 72   Mean BP 89   Pulse Ox % Pulse Ox % 95   Pulse Ox Activity Level  At rest   Oxygen Delivery Room Air/ 21 %    11:38   Vital Signs Type Routine   Temperature Temperature (F) 98.5   Celsius 36.9   Pulse Pulse 85   Respirations Respirations 20   Systolic BP Systolic BP 115   Diastolic BP (mmHg) Diastolic BP (mmHg) 63   Mean BP 80   Pulse Ox % Pulse Ox % 94   Pulse Ox Activity Level  At rest   Oxygen Delivery Room Air/ 21 %   Brief Assessment:   Additional Physical Exam abdomen is soft and non tender no gaurding pt is having flexible scope done today   Assessment/Plan:  Assessment/Plan:   Plan This pt probably has ischimic coloiltis and improving on antibiotics .after flexible scope today management would be decided   Electronic Signatures: Jovita GammaHashmi, Daionna Crossland (MD)  (Signed 27-Sep-13 14:22)  Authored: Chief Complaint, VITAL SIGNS/ANCILLARY NOTES, Brief Assessment, Assessment/Plan   Last Updated: 27-Sep-13 14:22 by Jovita GammaHashmi, Azir Muzyka (MD)

## 2014-06-06 NOTE — Consult Note (Signed)
Brief Consult Note: Diagnosis: Colitis.   Patient was seen by consultant.   Consult note dictated.   Comments: Appreciate consult for [age] y/o pleasant caucasian woman with history of htn, arthritis, and anxiety, for evaluation of colitis. Patient states she was in her usual state of health until yesterday at 1600. States at that time she began having lower abdominal cramping. States calling EMS, as she began to feel nauseated and faint, which improved a short time later, and she did not come to the hospital at that time. Then began to pass red bloody stools, which became frequent, about every hour- started to have clots in them. Called her daughter and came to ED with subsequent admission. States now that the cramping has become less severe, is predominantly on the left side, and that her stools have slowed. Last bowel movemnet was a little over 2hr ago, and described as small. Noncontrasted CT showed left sided colitis. A c-diff stool is pending, as is a ct angiogram of the abdomen. Hgb is stable from admission. Appreciate Dr Leanna BattlesHashimi's note.  Impression and plan: Colitis. Concerning for ischemic colitis v. other. Agree with current. Will additionally recommend sedated flexible sigmoidoscopy when feasible in the next couple of days.  Electronic Signatures: Kathryne HitchLondon, Christiane H (NP)  (Signed 25-Sep-13 14:02)  Authored: Brief Consult Note   Last Updated: 25-Sep-13 14:02 by Keturah BarreLondon, Christiane H (NP)

## 2014-06-20 ENCOUNTER — Ambulatory Visit: Payer: Self-pay | Admitting: Pain Medicine

## 2014-07-17 ENCOUNTER — Other Ambulatory Visit: Payer: Self-pay | Admitting: Pain Medicine

## 2014-07-17 DIAGNOSIS — M5416 Radiculopathy, lumbar region: Secondary | ICD-10-CM | POA: Insufficient documentation

## 2014-07-17 DIAGNOSIS — M533 Sacrococcygeal disorders, not elsewhere classified: Secondary | ICD-10-CM

## 2014-07-17 DIAGNOSIS — M47816 Spondylosis without myelopathy or radiculopathy, lumbar region: Secondary | ICD-10-CM

## 2014-07-17 DIAGNOSIS — M5136 Other intervertebral disc degeneration, lumbar region: Secondary | ICD-10-CM

## 2014-07-20 ENCOUNTER — Ambulatory Visit: Payer: Self-pay | Admitting: Pain Medicine

## 2014-07-30 ENCOUNTER — Encounter: Payer: Self-pay | Admitting: Emergency Medicine

## 2014-07-30 ENCOUNTER — Other Ambulatory Visit: Payer: Self-pay

## 2014-07-30 ENCOUNTER — Emergency Department
Admission: EM | Admit: 2014-07-30 | Discharge: 2014-07-31 | Disposition: A | Discharge status: Home / Self Care | Payer: Medicare Other | Attending: Emergency Medicine | Admitting: Emergency Medicine

## 2014-07-30 ENCOUNTER — Emergency Department: Payer: Medicare Other

## 2014-07-30 DIAGNOSIS — I10 Essential (primary) hypertension: Secondary | ICD-10-CM | POA: Diagnosis not present

## 2014-07-30 DIAGNOSIS — F419 Anxiety disorder, unspecified: Secondary | ICD-10-CM | POA: Insufficient documentation

## 2014-07-30 DIAGNOSIS — Z7982 Long term (current) use of aspirin: Secondary | ICD-10-CM | POA: Diagnosis not present

## 2014-07-30 DIAGNOSIS — R0600 Dyspnea, unspecified: Secondary | ICD-10-CM | POA: Insufficient documentation

## 2014-07-30 DIAGNOSIS — Z880 Allergy status to penicillin: Secondary | ICD-10-CM | POA: Diagnosis not present

## 2014-07-30 DIAGNOSIS — Z79899 Other long term (current) drug therapy: Secondary | ICD-10-CM | POA: Diagnosis not present

## 2014-07-30 DIAGNOSIS — Z792 Long term (current) use of antibiotics: Secondary | ICD-10-CM | POA: Insufficient documentation

## 2014-07-30 DIAGNOSIS — R0602 Shortness of breath: Secondary | ICD-10-CM | POA: Diagnosis present

## 2014-07-30 HISTORY — DX: Vascular disorder of intestine, unspecified: K55.9

## 2014-07-30 LAB — BASIC METABOLIC PANEL
ANION GAP: 8 (ref 5–15)
BUN: 24 mg/dL — ABNORMAL HIGH (ref 6–20)
CO2: 26 mmol/L (ref 22–32)
Calcium: 9.2 mg/dL (ref 8.9–10.3)
Chloride: 102 mmol/L (ref 101–111)
Creatinine, Ser: 0.88 mg/dL (ref 0.44–1.00)
GFR calc Af Amer: >60 mL/min (ref >60)
GFR calc non Af Amer: >60 mL/min (ref >60)
Glucose, Bld: 118 mg/dL — ABNORMAL HIGH (ref 65–99)
Potassium: 4.4 mmol/L (ref 3.5–5.1)
SODIUM: 136 mmol/L (ref 135–145)

## 2014-07-30 LAB — CBC
HCT: 36.9 % (ref 35.0–47.0)
Hemoglobin: 12 g/dL (ref 12.0–16.0)
MCH: 31 pg (ref 26.0–34.0)
MCHC: 32.5 g/dL (ref 32.0–36.0)
MCV: 95.3 fL (ref 80.0–100.0)
Platelets: 255 10*3/uL (ref 150–440)
RBC: 3.87 MIL/uL (ref 3.80–5.20)
RDW: 14.5 % (ref 11.5–14.5)
WBC: 8.4 10*3/uL (ref 3.6–11.0)

## 2014-07-30 LAB — TROPONIN I: Troponin I: <0.03 ng/mL (ref <0.031)

## 2014-07-30 LAB — BRAIN NATRIURETIC PEPTIDE: B Natriuretic Peptide: 19 pg/mL (ref 0.0–100.0)

## 2014-07-30 MED ORDER — HYDROCHLOROTHIAZIDE 25 MG PO TABS: ORAL_TABLET | ORAL | Status: DC

## 2014-07-30 NOTE — ED Notes (Signed)
Patient presents to Emergency Department via EMS with complaints of SOB. Pt states symptoms started after after Furosimide last night. reports hx of Anxiety attacks.

## 2014-07-30 NOTE — Discharge Instructions (Signed)
1. You may take HCTZ in place of Lasix. Take one half tablet as needed for leg swelling (HCTZ 25 mg #15). 2. Elevate legs whenever possible to decrease swelling. 3. You may also try compression stockings to decrease swelling. 4. Return to the ER for worsening symptoms, chest pain, difficulty breathing or other concerns.  Shortness of Breath Shortness of breath means you have trouble breathing. Shortness of breath needs medical care right away. HOME CARE   Do not smoke.  Avoid being around chemicals or things (paint fumes, dust) that may bother your breathing.  Rest as needed. Slowly begin your normal activities.  Only take medicines as told by your doctor.  Keep all doctor visits as told. GET HELP RIGHT AWAY IF:   Your shortness of breath gets worse.  You feel lightheaded, pass out (faint), or have a cough that is not helped by medicine.  You cough up blood.  You have pain with breathing.  You have pain in your chest, arms, shoulders, or belly (abdomen).  You have a fever.  You cannot walk up stairs or exercise the way you normally do.  You do not get better in the time expected.  You have a hard time doing normal activities even with rest.  You have problems with your medicines.  You have any new symptoms. MAKE SURE YOU:  Understand these instructions.  Will watch your condition.  Will get help right away if you are not doing well or get worse. Document Released: 07/23/2007 Document Revised: 02/08/2013 Document Reviewed: 04/21/2011 [facility] Patient Information 2015 [place], [place]. This information is not intended to replace advice given to you by your health care provider. Make sure you discuss any questions you have with your health care provider.  Panic Attacks Panic attacks are sudden, short feelings of great fear or discomfort. You may have them for no reason when you are relaxed, when you are uneasy (anxious), or when you are sleeping.  HOME CARE  Take  all your medicines as told.  Check with your doctor before starting new medicines.  Keep all doctor visits. GET HELP IF:  You are not able to take your medicines as told.  Your symptoms do not get better.  Your symptoms get worse. GET HELP RIGHT AWAY IF:  Your attacks seem different than your normal attacks.  You have thoughts about hurting yourself or others.  You take panic attack medicine and you have a side effect. MAKE SURE YOU:  Understand these instructions.  Will watch your condition.  Will get help right away if you are not doing well or get worse. Document Released: 03/08/2010 Document Revised: 11/24/2012 Document Reviewed: 09/17/2012 [facility] Patient Information 2015 [place], [place]. This information is not intended to replace advice given to you by your health care provider. Make sure you discuss any questions you have with your health care provider.

## 2014-07-30 NOTE — ED Provider Notes (Signed)
[facility] Emergency Department Provider Note  ____________________________________________  Time seen: Approximately 11:29 PM  I have reviewed the triage vital signs and the nursing notes.   HISTORY  Chief Complaint Shortness of Breath    HPI Dawn [name] is a 74 y.o. female who presents with shortness of breath and anxiety after taking furosemide. Patient saw her doctor 2 days ago for peripheral edema who prescribed Lasix 20 mg along with Valium for anxiety. Patient took one half Lasix yesterday, felt okay, then took another one half today. Shortly after taking today's dose of Lasix patient felt shaky and anxious and mildly short of breath.Took a Valium and now symptoms have resolved. During the episode patient denies chest pain, abdominal pain, vomiting, diarrhea, numbness, tingling.   Past Medical History  Diagnosis Date  . CHF (congestive heart failure)   . Fibromyalgia   . Rheumatoid arthritis   . Osteoarthritis   . Hypertension   . Leaky heart valve   . Colitis, ischemic     Patient Active Problem List   Diagnosis Date Noted  . DDD (degenerative disc disease), lumbar 07/17/2014  . Lumbar facet arthropathy 07/17/2014  . Lumbar radicular pain 07/17/2014  . Sacroiliac joint dysfunction 07/17/2014    Past Surgical History  Procedure Laterality Date  . Tonsillectomy      Current Outpatient Rx  Name  Route  Sig  Dispense  Refill  . aspirin 325 MG tablet   Oral   Take 325 mg by mouth daily.         . diazepam (VALIUM) 2 MG tablet   Oral   Take 2 mg by mouth every 8 (eight) hours as needed for anxiety.         . furosemide (LASIX) 20 MG tablet   Oral   Take 20 mg by mouth daily as needed.         Marland Kitchen levofloxacin (LEVAQUIN) 500 MG tablet   Oral   Take 500 mg by mouth daily.         . traMADol (ULTRAM) 50 MG tablet   Oral   Take 50 mg by mouth every 6 (six) hours as needed. Limit 1-2 tabs bid-qid         . venlafaxine  XR (EFFEXOR-XR) 150 MG 24 hr capsule   Oral   Take 150 mg by mouth daily with breakfast.         . acetaminophen (TYLENOL) 650 MG CR tablet   Oral   Take 650 mg by mouth every 8 (eight) hours as needed for pain.         . nitrofurantoin, macrocrystal-monohydrate, (MACROBID) 100 MG capsule   Oral   Take 100 mg by mouth 2 (two) times daily.           Allergies Doxycycline; Penicillin g; and Sulfa antibiotics  History reviewed. No pertinent family history.  Social History History  Substance Use Topics  . Smoking status: Never Smoker   . Smokeless tobacco: Not on file  . Alcohol Use: No    Review of Systems Constitutional: No fever/chills Eyes: No visual changes. ENT: No sore throat. Cardiovascular: Denies chest pain. Respiratory: Positive for shortness of breath. Gastrointestinal: No abdominal pain.  No nausea, no vomiting.  No diarrhea.  No constipation. Genitourinary: Negative for dysuria. Musculoskeletal: Negative for back pain. Skin: Negative for rash. Neurological: Negative for headaches, focal weakness or numbness.  10-point ROS otherwise negative.  ____________________________________________   PHYSICAL EXAM:  VITAL SIGNS: ED  Triage Vitals  Enc Vitals Group     BP 07/30/14 2142 155/71 mmHg     Pulse Rate 07/30/14 2142 88     Resp 07/30/14 2142 18     Temp 07/30/14 2142 98.3 F (36.8 C)     Temp Source 07/30/14 2142 Oral     SpO2 07/30/14 2139 94 %     Weight 07/30/14 2142 215 lb 6.2 oz (97.7 kg)     Height 07/30/14 2142  (1.626 m)     Head Cir --      Peak Flow --      Pain Score 07/30/14 2145 8     Pain Loc --      Pain Edu? --      Excl. in [place]? --     Constitutional: Alert and oriented. Well appearing and in no acute distress. Eyes: Conjunctivae are normal. PERRL. EOMI. Head: Atraumatic. Nose: No congestion/rhinnorhea. Mouth/Throat: Mucous membranes are moist.  Oropharynx non-erythematous. Neck: No stridor.   Cardiovascular:  Normal rate, regular rhythm. Grossly normal heart sounds.  Good peripheral circulation. Respiratory: Normal respiratory effort.  No retractions. Lungs CTAB. Gastrointestinal: Soft and nontender. No distention. No abdominal bruits. No CVA tenderness. Musculoskeletal: 1+ nonpitting BLE edema. No lower extremity tenderness. No joint effusions. Neurologic:  Normal speech and language. No gross focal neurologic deficits are appreciated. Speech is normal. No gait instability. Skin:  Skin is warm, dry and intact. No rash noted. Psychiatric: Mood and affect are normal. Speech and behavior are normal.  ____________________________________________   LABS (all labs ordered are listed, but only abnormal results are displayed)  Labs Reviewed  BASIC METABOLIC PANEL - Abnormal; Notable for the following:    Glucose, Bld 118 (*)    BUN 24 (*)    All other components within normal limits  CBC  BRAIN NATRIURETIC PEPTIDE  TROPONIN I   ____________________________________________  EKG  ED ECG REPORT I, Andersson Larrabee J, the attending physician, personally viewed and interpreted this ECG.   Date: 07/30/2014  EKG Time: 2144  Rate: 85  Rhythm: normal EKG, normal sinus rhythm  Axis: Normal  Intervals:none  ST&T Change: Nonspecific  ____________________________________________  RADIOLOGY  Portable chest (viewed by me, interpreted by Dr. Cherly Hensen): Elevation of the right hemidiaphragm. Lungs remain grossly clear. ____________________________________________   PROCEDURES  Procedure(s) performed: None  Critical Care performed: No  ____________________________________________   INITIAL IMPRESSION / ASSESSMENT AND PLAN / ED COURSE  Pertinent labs & imaging results that were available during my care of the patient were reviewed by me and considered in my medical decision making (see chart for details).  74 year old female who presents with shortness of breath and anxiety after taking one half  tablet of furosemide. Currently voices no complaints with room air sats of 98%. No evidence of congestive heart failure. Discussed with patient, will prescribe low-dose HCTZ (instead of furosemide) for patient to take when necessary for pedal edema. Also encouraged patient to wear TED hose as well as elevate her legs whenever possible. Strict precautions given. Patient verbalizes understanding and agrees with plan of care. ____________________________________________   FINAL CLINICAL IMPRESSION(S) / ED DIAGNOSES  Final diagnoses:  Anxiety  Dyspnea      Irean Hong, MD 07/31/14 [phone]

## 2014-08-16 ENCOUNTER — Ambulatory Visit: Payer: Self-pay | Admitting: Pain Medicine

## 2014-09-05 ENCOUNTER — Telehealth: Payer: Self-pay | Admitting: Pain Medicine

## 2014-09-05 NOTE — Telephone Encounter (Signed)
Patient says she is in constant pain from waist down to toes, would like to come for appt. Cant tell if it is disc in back or fibromyalgia Wants to come this week

## 2014-09-06 NOTE — Telephone Encounter (Signed)
Gloris ManchesterAshley, Kathy, and [place] Lake[place]gie,  As we have already discussed, patient has been a "NO SHOW" multiple times and ,therefore, decision was to.schedule patient began.  Please call patient and suggest that she discuss with her primary care physician referral to another physician for treatment of her condition.

## 2014-09-06 NOTE — Telephone Encounter (Signed)
Having "fibromyalgia pain" and "sciatic pain." Would like to have a procedure for the sciatic pain and some pain meds.

## 2017-04-16 DIAGNOSIS — F4310 Post-traumatic stress disorder, unspecified: Secondary | ICD-10-CM | POA: Insufficient documentation

## 2017-04-16 DIAGNOSIS — F410 Panic disorder [episodic paroxysmal anxiety] without agoraphobia: Secondary | ICD-10-CM | POA: Insufficient documentation

## 2017-04-16 DIAGNOSIS — F329 Major depressive disorder, single episode, unspecified: Secondary | ICD-10-CM | POA: Insufficient documentation

## 2017-04-16 DIAGNOSIS — F411 Generalized anxiety disorder: Secondary | ICD-10-CM | POA: Diagnosis present

## 2021-03-16 ENCOUNTER — Emergency Department: Payer: Medicare HMO

## 2021-03-16 ENCOUNTER — Emergency Department
Admission: EM | Admit: 2021-03-16 | Discharge: 2021-03-16 | Disposition: A | Discharge status: Home / Self Care | Payer: Medicare HMO | Attending: Physician Assistant | Admitting: Physician Assistant

## 2021-03-16 DIAGNOSIS — I10 Essential (primary) hypertension: Secondary | ICD-10-CM | POA: Diagnosis not present

## 2021-03-16 DIAGNOSIS — I110 Hypertensive heart disease with heart failure: Secondary | ICD-10-CM | POA: Diagnosis not present

## 2021-03-16 DIAGNOSIS — I509 Heart failure, unspecified: Secondary | ICD-10-CM | POA: Diagnosis not present

## 2021-03-16 DIAGNOSIS — Z760 Encounter for issue of repeat prescription: Secondary | ICD-10-CM

## 2021-03-16 DIAGNOSIS — M25562 Pain in left knee: Secondary | ICD-10-CM | POA: Diagnosis not present

## 2021-03-16 DIAGNOSIS — R52 Pain, unspecified: Secondary | ICD-10-CM | POA: Diagnosis not present

## 2021-03-16 MED ORDER — TRAMADOL HCL 50 MG PO TABS: 50.0000 mg | ORAL_TABLET | Freq: Two times a day (BID) | ORAL | 0 refills | Status: AC

## 2021-03-16 MED ORDER — TRAMADOL HCL 50 MG PO TABS: 50.0000 mg | ORAL_TABLET | Freq: Three times a day (TID) | ORAL | 0 refills | Status: AC | PRN

## 2021-03-16 NOTE — ED Notes (Signed)
Pt states she has been having pain in her right knee for about 3 days and has not gotten better. Pt can move right knee but it is painful to move. RN palpated knee and is painful to touch. Pt denies any recent falls.

## 2021-03-16 NOTE — ED Triage Notes (Signed)
Per EMS, Pt, from home, c/o L knee pain.  Pain score 10/10.  Hx of fibromyalgia.  Pt reports she "rolled over on a nut and it pushed into her knee joint."  Pt would like something for PTSD and anxiety as well.

## 2021-03-16 NOTE — ED Notes (Signed)
Pt verbalized understanding of discharge instructions, follow-up care instructions, and prescription medications. Pt advised to return to ED if symptoms worsen.

## 2021-03-16 NOTE — Discharge Instructions (Addendum)
Your exam and x-ray are normal and reassuring at this time for no signs of a serious underlying knee condition or significant arthritis.  Take the over-the-counter Tylenol, Motrin, and prescription tramadol as needed for pain relief.  Restart your Effexor XR as prescribed.  You should follow-up with primary provider within the month for ongoing medication management.  Your future request for a refill through the emergency department may not be honored, if you do not reestablish care with your PCP.

## 2021-03-16 NOTE — ED Provider Notes (Signed)
[facility] Provider Note  Patient Contact: 3:23 PM (approximate)   History   Knee Pain   HPI  Dawn [name] is a 81 y.o. female with a history of DDD, CHF, hypertension, fibromyalgia, generalized anxiety, PTSD, presents to the ED for acute left knee pain.  Patient denies any recent injury, trauma, or fall.  She reports medial aspect left knee pain that is increased over the last 2 to 3 days.  She reports some pain that feels like a catch or click with prolonged walking.  She denies any fevers, chills, or sweats.  He does have a request for a courtesy refill of her anxiety/PTSD medications.  She is scheduled to see her primary provider at [facility], and has access to psych counseling services.  Reports her last several months she has been crippled by her PTSD and GAD. She denies any SI/HI.  Physical Exam   Triage Vital Signs: ED Triage Vitals  Enc Vitals Group     BP 03/16/21 1426 (!) 149/89     Pulse Rate 03/16/21 1426 80     Resp 03/16/21 1426 16     Temp 03/16/21 1426 98.2 F (36.8 C)     Temp Source 03/16/21 1426 Oral     SpO2 03/16/21 1423 96 %     Weight --      Height --      Head Circumference --      Peak Flow --      Pain Score 03/16/21 1428 10     Pain Loc --      Pain Edu? --      Excl. in [place]? --     Most recent vital signs: Vitals:   03/16/21 1426 03/16/21 1748  BP: (!) 149/89 128/63  Pulse: 80 74  Resp: 16 16  Temp: 98.2 F (36.8 C) 97.7 F (36.5 C)  SpO2: 97% 100%     General: Alert and in no acute distress. Cardiovascular:  Good peripheral perfusion Respiratory: Normal respiratory effort without tachypnea or retractions. Lungs CTAB.  Musculoskeletal: Full range of motion to all extremities.  Left knee without obvious deformity, dislocation, or effusion.  Patient nontender to palpation to the medial aspect of the joint at the proximal tibia.  No joint laxity is appreciated.  No valgus or varus stress is elicited.  Negative  anterior/posterior drawer sign.  Negative McMurray's and Lachman's on exam. Neurologic:  No gross focal neurologic deficits are appreciated.  Skin:   No rash noted Other:   ED Results / Procedures / Treatments   Labs (all labs ordered are listed, but only abnormal results are displayed) Labs Reviewed - No data to display   EKG   RADIOLOGY  I personally viewed and evaluated these images as part of my medical decision making, as well as reviewing the written report by the radiologist.  ED Provider Interpretation: no acute findings.  DG Left Knee   IMPRESSION: No acute fracture or dislocation.  PROCEDURES:  Critical Care performed: No  Procedures   MEDICATIONS ORDERED IN ED: Medications - No data to display   IMPRESSION / MDM / [place] / ED COURSE  I reviewed the triage vital signs and the nursing notes.                              Differential diagnosis includes, but is not limited to, knee sprain, tendinitis, joint effusion, OA  ED  evaluation of acute left knee pain presents to the ED for evaluation.  She is evaluated for complaints, and found have overall reassuring exam without signs of internal derangement.  Plain films of the left knee did not reveal any acute fracture, dislocation, or significant degenerative changes, as reviewed by me.  Patient's diagnosis is consistent with knee sprain. Patient will be discharged home with prescriptions for Adderall and Effexor XR. Patient is to follow up with her primary care provider as needed or otherwise directed. Patient is given ED precautions to return to the ED for any worsening or new symptoms.  She is advised to establish care prior to completion of this courtesy monthly refill, for continuation of care.   FINAL CLINICAL IMPRESSION(S) / ED DIAGNOSES   Final diagnoses:  Acute pain of left knee  Encounter for medication refill     Rx / DC Orders   ED Discharge Orders          Ordered    traMADol  (ULTRAM) 50 MG tablet  2 times daily        03/16/21 1628    traMADol (ULTRAM) 50 MG tablet  3 times daily PRN        03/16/21 1628             Note:  This document was prepared using Dragon voice recognition software and may include unintentional dictation errors.    Melvenia Needles, PA-C 03/17/21 1835    Carrie Mew, MD 03/17/21 [phone]

## 2021-05-31 ENCOUNTER — Emergency Department: Payer: 59

## 2021-05-31 ENCOUNTER — Other Ambulatory Visit: Payer: Self-pay

## 2021-05-31 ENCOUNTER — Observation Stay
Admission: EM | Admit: 2021-05-31 | Discharge: 2021-06-02 | Disposition: A | Discharge status: Home / Self Care | Payer: 59 | Attending: Internal Medicine | Admitting: Internal Medicine

## 2021-05-31 DIAGNOSIS — I10 Essential (primary) hypertension: Secondary | ICD-10-CM | POA: Diagnosis not present

## 2021-05-31 DIAGNOSIS — K921 Melena: Secondary | ICD-10-CM | POA: Diagnosis not present

## 2021-05-31 DIAGNOSIS — R739 Hyperglycemia, unspecified: Secondary | ICD-10-CM

## 2021-05-31 DIAGNOSIS — Z8719 Personal history of other diseases of the digestive system: Secondary | ICD-10-CM

## 2021-05-31 DIAGNOSIS — K922 Gastrointestinal hemorrhage, unspecified: Principal | ICD-10-CM

## 2021-05-31 DIAGNOSIS — F411 Generalized anxiety disorder: Secondary | ICD-10-CM | POA: Diagnosis present

## 2021-05-31 LAB — PROTIME-INR
INR: 0.9 (ref 0.8–1.2)
Prothrombin Time: 12.2 seconds (ref 11.4–15.2)

## 2021-05-31 LAB — CBC WITH DIFFERENTIAL/PLATELET
Abs Immature Granulocytes: 0.07 10*3/uL (ref 0.00–0.07)
Basophils Absolute: 0.1 10*3/uL (ref 0.0–0.1)
Basophils Relative: 0 %
Eosinophils Absolute: 0.3 10*3/uL (ref 0.0–0.5)
Eosinophils Relative: 2 %
HCT: 40.3 % (ref 36.0–46.0)
Hemoglobin: 12.4 g/dL (ref 12.0–15.0)
Immature Granulocytes: 1 %
Lymphocytes Relative: 22 %
Lymphs Abs: 3.1 10*3/uL (ref 0.7–4.0)
MCH: 30 pg (ref 26.0–34.0)
MCHC: 30.8 g/dL (ref 30.0–36.0)
MCV: 97.3 fL (ref 80.0–100.0)
Monocytes Absolute: 0.9 10*3/uL (ref 0.1–1.0)
Monocytes Relative: 7 %
Neutro Abs: 9.5 10*3/uL — ABNORMAL HIGH (ref 1.7–7.7)
Neutrophils Relative %: 68 %
Platelets: 439 10*3/uL — ABNORMAL HIGH (ref 150–400)
RBC: 4.14 MIL/uL (ref 3.87–5.11)
RDW: 14 % (ref 11.5–15.5)
WBC: 13.9 10*3/uL — ABNORMAL HIGH (ref 4.0–10.5)
nRBC: 0 % (ref 0.0–0.2)

## 2021-05-31 LAB — COMPREHENSIVE METABOLIC PANEL
ALT: 66 U/L — ABNORMAL HIGH (ref 0–44)
AST: 39 U/L (ref 15–41)
Albumin: 3.5 g/dL (ref 3.5–5.0)
Alkaline Phosphatase: 251 U/L — ABNORMAL HIGH (ref 38–126)
Anion gap: 7 (ref 5–15)
BUN: 26 mg/dL — ABNORMAL HIGH (ref 8–23)
CO2: 25 mmol/L (ref 22–32)
Calcium: 9.1 mg/dL (ref 8.9–10.3)
Chloride: 102 mmol/L (ref 98–111)
Creatinine, Ser: 0.84 mg/dL (ref 0.44–1.00)
GFR, Estimated: >60 mL/min (ref >60)
Glucose, Bld: 291 mg/dL — ABNORMAL HIGH (ref 70–99)
Potassium: 4.4 mmol/L (ref 3.5–5.1)
Sodium: 134 mmol/L — ABNORMAL LOW (ref 135–145)
Total Bilirubin: 0.9 mg/dL (ref 0.3–1.2)
Total Protein: 6.7 g/dL (ref 6.5–8.1)

## 2021-05-31 LAB — TYPE AND SCREEN
ABO/RH(D): O POS
Antibody Screen: NEGATIVE

## 2021-05-31 MED ORDER — PANTOPRAZOLE 80MG IVPB - SIMPLE MED
80.0000 mg | Freq: Once | INTRAVENOUS | Status: AC
  Administered 2021-06-01: 80 mg via INTRAVENOUS
  Filled 2021-05-31: qty 100

## 2021-05-31 MED ORDER — SODIUM CHLORIDE 0.9 % IV BOLUS
1000.0000 mL | Freq: Once | INTRAVENOUS | Status: AC
  Administered 2021-05-31: 1000 mL via INTRAVENOUS

## 2021-05-31 MED ORDER — IOHEXOL 300 MG/ML  SOLN
100.0000 mL | Freq: Once | INTRAMUSCULAR | Status: AC | PRN
Start: 2021-05-31 — End: 2021-05-31
  Administered 2021-05-31: 100 mL via INTRAVENOUS

## 2021-05-31 NOTE — ED Notes (Signed)
Patient returned from CT. Patient placed back on monitoring equipment. Lactic acid  and EKG to be completed. ?

## 2021-05-31 NOTE — ED Provider Notes (Signed)
? ?[facility] ?Provider Note ? ? ? Event Date/Time  ? First MD Initiated Contact with Patient 05/31/21 2259   ?  (approximate) ? ? ?History  ? ?Rectal Bleeding ? ? ?HPI ? ?Dawn [name] is a 81 y.o. female  with pmh CHF, ischemic colitis, HTN, RA who presents with black stool.  Patient has had 2 episodes of dark which she describes as tarry stool over the past day.  Similar episode happened about a month ago but was self resolving.  She endorses some generalized abdominal discomfort, denies any weight loss.  Also with some dyspnea on exertion.  Patient has significant anxiety about come to the hospital did not want to come. ? ?  ? ?Past Medical History:  ?Diagnosis Date  ? CHF (congestive heart failure) ([place])   ? Colitis, ischemic ([place])   ? Fibromyalgia   ? Hypertension   ? Leaky heart valve   ? Osteoarthritis   ? Rheumatoid arthritis ([place])   ? ? ?Patient Active Problem List  ? Diagnosis Date Noted  ? 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 Lumbar facet arthropathy 07/17/2014  ? Lumbar radicular pain 07/17/2014  ? 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 ? ? ?Physical Exam  ?Triage Vital Signs: ?ED Triage Vitals  ?Enc Vitals Group  ?   BP 05/31/21 2242 (!) 171/71  ?   Pulse Rate 05/31/21 2242 87  ?   Resp 05/31/21 2242 18  ?   Temp 05/31/21 2242 98 ?F (36.7 ?C)  ?   Temp Source 05/31/21 2242 Oral  ?   SpO2 05/31/21 2242 98 %  ?   Weight 05/31/21 2244 180 lb (81.6 kg)  ?   Height 05/31/21 2244 5\' 2"  (1.575 m)  ?   Head Circumference --   ?   Peak Flow --   ?   Pain Score 05/31/21 2243 0  ?   Pain Loc --   ?   Pain Edu? --   ?   Excl. in [place]? --   ? ? ?Most recent vital signs: ?Vitals:  ? 05/31/21 2242 06/01/21 0005  ?BP: (!) 171/71 (!) 144/75  ?Pulse: 87 80  ?Resp: 18 17  ?Temp: 98 ?F (36.7 ?C)   ?SpO2: 98% 100%  ? ? ? ?General: Awake, no distress.  ?CV:  Good peripheral perfusion.  ?Resp:  Normal effort.  ?Abd:  No distention.  Diffuse abdominal tenderness but overall benign  abdomen ?Neuro:             Awake, Alert, Oriented x 3  ?Other:  Black stool on rectal exam ? ? ?ED Results / Procedures / Treatments  ?Labs ?(all labs ordered are listed, but only abnormal results are displayed) ?Labs Reviewed  ?CBC WITH DIFFERENTIAL/PLATELET - Abnormal; Notable for the following components:  ?    Result Value  ? WBC 13.9 (*)   ? Platelets 439 (*)   ? Neutro Abs 9.5 (*)   ? All other components within normal limits  ?COMPREHENSIVE METABOLIC PANEL - Abnormal; Notable for the following components:  ? Sodium 134 (*)   ? Glucose, Bld 291 (*)   ? BUN 26 (*)   ? ALT 66 (*)   ? Alkaline Phosphatase 251 (*)   ? All other components within normal limits  ?PROTIME-INR  ?LACTIC ACID, PLASMA  ?LACTIC ACID, PLASMA  ?TYPE AND SCREEN  ? ? ? ?EKG ? ? ? ? ?RADIOLOGY ?I reviewed the CXR which does not  show any acute cardiopulmonary process ? ? ? ?PROCEDURES: ? ?Critical Care performed: Yes, see critical care procedure note(s) ? ?Procedures ? ?The patient is on the cardiac monitor to evaluate for evidence of arrhythmia and/or significant heart rate changes. ? ? ?MEDICATIONS ORDERED IN ED: ?Medications  ?pantoprazole (PROTONIX) 80 mg /NS 100 mL IVPB (has no administration in time range)  ?sodium chloride 0.9 % bolus 1,000 mL (1,000 mLs Intravenous New Bag/Given 05/31/21 2356)  ?iohexol (OMNIPAQUE) 300 MG/ML solution 100 mL (100 mLs Intravenous Contrast Given 05/31/21 2338)  ? ? ? ?IMPRESSION / MDM / ASSESSMENT AND PLAN / ED COURSE  ?I reviewed the triage vital signs and the nursing notes. ?             ?               ? ?Differential diagnosis includes, but is not limited to, peptic ulcer, gastritis, AVM ? ?Patient is a 81 year old female who presents with melena x1 day.  Has had 2 episodes at home.  On exam she appears well she is hemodynamically stable.  Hemoglobin is stable at her baseline at 12.4.  She has a mild leukocytosis to 14.  Lactate is negative.  She also complaining of some diffuse abdominal pain so CT  obtained which is negative for acute abnormality.  On rectal exam she does have melena.  Patient has a history of ischemic colitis but no prior episodes of upper GI bleeding.  Given her age with the presence of melena will start on Protonix and admit to the medicine service.  Patient also complaining of some dyspnea been going on for several weeks.  Chest x-ray by my review does not show any significant abnormality, radiology does comment on some vascular congestion. ? ?  ? ? ?FINAL CLINICAL IMPRESSION(S) / ED DIAGNOSES  ? ?Final diagnoses:  ?UGIB (upper gastrointestinal bleed)  ? ? ? ?Rx / DC Orders  ? ?ED Discharge Orders   ? ? None  ? ?  ? ? ? ?Note:  This document was prepared using Dragon voice recognition software and may include unintentional dictation errors. ?  ?Rada Hay, MD ?06/01/21 0031 ? ?

## 2021-05-31 NOTE — ED Triage Notes (Signed)
Patient c/o rectal bleeding since 0800 but waited to call because she has PTSD. She has also been noncompliant with Metformin. ?

## 2021-06-01 ENCOUNTER — Encounter
Admission: EM | Disposition: A | Discharge status: Home / Self Care | Payer: Self-pay | Source: Home / Self Care | Attending: Emergency Medicine

## 2021-06-01 ENCOUNTER — Encounter: Payer: Self-pay | Admitting: Anesthesiology

## 2021-06-01 DIAGNOSIS — F411 Generalized anxiety disorder: Secondary | ICD-10-CM

## 2021-06-01 DIAGNOSIS — K921 Melena: Secondary | ICD-10-CM | POA: Diagnosis present

## 2021-06-01 DIAGNOSIS — R739 Hyperglycemia, unspecified: Secondary | ICD-10-CM

## 2021-06-01 DIAGNOSIS — Z8719 Personal history of other diseases of the digestive system: Secondary | ICD-10-CM

## 2021-06-01 DIAGNOSIS — I10 Essential (primary) hypertension: Secondary | ICD-10-CM

## 2021-06-01 LAB — HEMOGLOBIN: Hemoglobin: 11.1 g/dL — ABNORMAL LOW (ref 12.0–15.0)

## 2021-06-01 LAB — HEMOGLOBIN A1C
Hgb A1c MFr Bld: 6.5 % — ABNORMAL HIGH (ref 4.8–5.6)
Mean Plasma Glucose: 139.85 mg/dL

## 2021-06-01 LAB — GLUCOSE, CAPILLARY
Glucose-Capillary: 183 mg/dL — ABNORMAL HIGH (ref 70–99)
Glucose-Capillary: 150 mg/dL — ABNORMAL HIGH (ref 70–99)

## 2021-06-01 LAB — LACTIC ACID, PLASMA
Lactic Acid, Venous: 1 mmol/L (ref 0.5–1.9)
Lactic Acid, Venous: 1.6 mmol/L (ref 0.5–1.9)

## 2021-06-01 SURGERY — ESOPHAGOGASTRODUODENOSCOPY (EGD) WITH PROPOFOL
Anesthesia: Monitor Anesthesia Care

## 2021-06-01 MED ORDER — PANTOPRAZOLE SODIUM 40 MG PO TBEC
40.0000 mg | DELAYED_RELEASE_TABLET | Freq: Two times a day (BID) | ORAL | Status: DC
Start: 2021-06-01 — End: 2021-06-02
  Administered 2021-06-01 – 2021-06-02 (×3): 40 mg via ORAL
  Filled 2021-06-01 (×3): qty 1

## 2021-06-01 MED ORDER — LACTATED RINGERS IV SOLN
INTRAVENOUS | Status: DC
  Administered 2021-06-01: 1000 mL via INTRAVENOUS

## 2021-06-01 MED ORDER — ACETAMINOPHEN 325 MG PO TABS
650.0000 mg | ORAL_TABLET | Freq: Four times a day (QID) | ORAL | Status: DC | PRN
  Administered 2021-06-01 – 2021-06-02 (×2): 650 mg via ORAL
  Filled 2021-06-01 (×2): qty 2

## 2021-06-01 MED ORDER — ONDANSETRON HCL 4 MG PO TABS
4.0000 mg | ORAL_TABLET | Freq: Four times a day (QID) | ORAL | Status: DC | PRN
  Administered 2021-06-02: 4 mg via ORAL
  Filled 2021-06-01: qty 1

## 2021-06-01 MED ORDER — VENLAFAXINE HCL ER 75 MG PO CP24
150.0000 mg | ORAL_CAPSULE | Freq: Every day | ORAL | Status: DC
  Administered 2021-06-01 – 2021-06-02 (×2): 150 mg via ORAL
  Filled 2021-06-01 (×2): qty 2

## 2021-06-01 MED ORDER — MELATONIN 5 MG PO TABS
5.0000 mg | ORAL_TABLET | Freq: Every day | ORAL | Status: DC
  Administered 2021-06-01: 5 mg via ORAL
  Filled 2021-06-01: qty 1

## 2021-06-01 MED ORDER — ONDANSETRON HCL 4 MG/2ML IJ SOLN: 4.0000 mg | Freq: Four times a day (QID) | INTRAMUSCULAR | Status: DC | PRN

## 2021-06-01 MED ORDER — ACETAMINOPHEN 650 MG RE SUPP: 650.0000 mg | Freq: Four times a day (QID) | RECTAL | Status: DC | PRN

## 2021-06-01 MED ORDER — TRAZODONE HCL 50 MG PO TABS: 50.0000 mg | ORAL_TABLET | Freq: Every evening | ORAL | Status: DC | PRN

## 2021-06-01 NOTE — H&P (Signed)
?History and Physical  ? ? ?Patient: Dawn [name] QPR:[id number] DOB: 1941/01/10 ?DOA: 05/31/2021 ?DOS: the patient was seen and examined on 06/01/2021 ?PCP: Kirk Ruths, MD  ?Patient coming from: Home ? ?Chief Complaint:  ?Chief Complaint  ?Patient presents with  ? Rectal Bleeding  ? ? ?HPI: Dawn [name] is a 81 y.o. female with medical history significant for Ischemic colitis in 2013 presenting with bright red blood per rectum, depression and rheumatoid arthritis who presents to the ED with a 1 day history of black tarry stool.  Had a similar episode a month prior which resolved on its own.  She has associated generalized abdominal cramping.  She denies nausea or vomiting. ?ED course and data review: BP 171/71 on arrival with pulse of 87.  WBC 13,000 with hemoglobin 12.4.  INR 0.9.  Blood glucose 291.  ALT 66 and alk phos 251.  Lactic acid 1.6.  CT abdomen and pelvis with contrast showed cholelithiasis and sigmoid diverticulosis.  Patient started on a Protonix infusion and IV fluid bolus.  Hospitalist consulted for admission.  ? ?Review of Systems: As mentioned in the history of present illness. All other systems reviewed and are negative. ?Past Medical History:  ?Diagnosis Date  ? CHF (congestive heart failure) ([place])   ? Colitis, ischemic ([place])   ? Fibromyalgia   ? Hypertension   ? Leaky heart valve   ? Osteoarthritis   ? Rheumatoid arthritis ([place])   ? ?Past Surgical History:  ?Procedure Laterality Date  ? TONSILLECTOMY    ? ?Social History:  reports that she has never smoked. She does not have any smokeless tobacco history on file. She reports that she does not currently use drugs. She reports that she does not drink alcohol. ? ?Allergies  ?Allergen Reactions  ? Doxycycline Nausea And Vomiting  ? Penicillin G Swelling  ? Sulfa Antibiotics Nausea And Vomiting  ? ? ?History reviewed. No pertinent family history. ? ?Prior to Admission medications   ?Medication Sig Start Date End Date Taking? Authorizing  Provider  ?acetaminophen (TYLENOL) 650 MG CR tablet Take 650 mg by mouth every 8 (eight) hours as needed for pain.    [provider]  ?aspirin 325 MG tablet Take 325 mg by mouth daily.    [provider]  ?venlafaxine XR (EFFEXOR-XR) 150 MG 24 hr capsule Take 150 mg by mouth daily with breakfast.    [provider]  ? ? ?Physical Exam: ?Vitals:  ? 05/31/21 2242 05/31/21 2244 06/01/21 0005  ?BP: (!) 171/71  (!) 144/75  ?Pulse: 87  80  ?Resp: 18  17  ?Temp: 98 ?F (36.7 ?C)    ?TempSrc: Oral    ?SpO2: 98%  100%  ?Weight:  81.6 kg   ?Height:  _0  (1.575 m)   ? ?Physical Exam ?Vitals and nursing note reviewed.  ?Constitutional:   ?   General: She is not in acute distress. ?HENT:  ?   Head: Normocephalic and atraumatic.  ?Cardiovascular:  ?   Rate and Rhythm: Normal rate and regular rhythm.  ?   Pulses: Normal pulses.  ?   Heart sounds: Normal heart sounds.  ?Pulmonary:  ?   Effort: Pulmonary effort is normal.  ?   Breath sounds: Normal breath sounds.  ?Abdominal:  ?   Palpations: Abdomen is soft.  ?   Tenderness: There is no abdominal tenderness.  ?Neurological:  ?   Mental Status: Mental status is at baseline.  ? ? ? ?Data Reviewed: ?  Relevant notes from primary care and specialist visits, past discharge summaries as available in EHR, including Care Everywhere. ?Prior diagnostic testing as pertinent to current admission diagnoses ?Updated medications and problem lists for reconciliation ?ED course, including vitals, labs, imaging, treatment and response to treatment ?Triage notes, nursing and pharmacy notes and ED provider's notes ?Notable results as noted in HPI ? ? ?Assessment and Plan: ?* Melena ?Hemodynamically stable on admission with  hemoglobin of 12 ?Serial H&H and transfuse if necessary ?Continue Protonix infusion. N.p.o. and IV hydration ?GI consult ? ?History of ischemic colitis 2013 ?CT abdomen and pelvis with contrast was nonacute ? ?Generalized anxiety disorder ?As needed IV  Ativan while n.p.o. ? ?Hypertension ?IV as needed's ? ? ? ? ? ? ?Advance Care Planning:   Code Status: Not on file  ? ?Consults: GI, Dr. Virgina Jock ? ?Family Communication: none ? ?Severity of Illness: ?The appropriate patient status for this patient is INPATIENT. Inpatient status is judged to be reasonable and necessary in order to provide the required intensity of service to ensure the patient's safety. The patient's presenting symptoms, physical exam findings, and initial radiographic and laboratory data in the context of their chronic comorbidities is felt to place them at high risk for further clinical deterioration. Furthermore, it is not anticipated that the patient will be medically stable for discharge from the hospital within 2 midnights of admission.  ? ?* I certify that at the point of admission it is my clinical judgment that the patient will require inpatient hospital care spanning beyond 2 midnights from the point of admission due to high intensity of service, high risk for further deterioration and high frequency of surveillance required.* ? ?Author: ?Athena Masse, MD ?06/01/2021 1:15 AM ? ?For on call review [email or web address].  ?

## 2021-06-01 NOTE — Assessment & Plan Note (Signed)
Blood sugar 291 with no prior history of diabetes ?We will get A1c and start fingersticks if elevated. ?

## 2021-06-01 NOTE — ED Notes (Addendum)
Assisted pt to toilet 

## 2021-06-01 NOTE — Consult Note (Signed)
? ? ?GI Inpatient Consult Note ? ?Reason for Consult: Melena/UGIB ?  ?Attending Requesting Consult: Dr. Judd Gaudier ? ?History of Present Illness: ?Dawn [name] is a 81 y.o. female seen for evaluation of melena at the request of admitting hospitalist - Dr. Judd Gaudier. Patient has a PMH of HTN, OA, rheumatoid arthritis, fibromyalgia, Hx of ischemic colitis 2013, and CHF. She presented to the [facility] ED yesterday evening for chief complaint of 2 episodes of black, tarry stool yesterday prior to presentation. She reports she had one episode of black, tarry stool approximately one month ago and didn't think much of it because it occurred once and resolved on its own. However, yesterday it happened twice - once in the morning and then once in the afternoon. She noticed some generalized abdominal discomfort and just feeling not herself so she presented to the ED. Upon presentation to the ED, she was hypertensive (171/71) with otherwise normal vital signs. Labs were significant for WBC 13K, hemoglobin 12.4, INR 0.9, ALT 66, and alk phos 251. Lactic acid 1.6. A contrasted CT scan abd/pelvis showed cholelithiasis, sigmoid diverticulosis, and otherwise no acute findings. Per ED notes, rectal exam by ED physician confirmed melena. She was given IV fluid bolus and started on Protonix infusion. GI was consulted for further evaluation and management.  ? ?Patient seen and examined this morning resting comfortably in hospital bed. She reports she lives alone. She reports she has not had any further episodes of melanotic stool since her presentation to the hospital. Abdominal discomfort is still present, but mild in severity. She denies any nausea, vomiting, epigastric abdominal pain, retrosternal burning, or volume regurgitation of stomach acid. She has not noticed any loss of appetite or unintentional weight loss. She reports at home she takes Nexium 20 mg OTC on an as needed basis for heartburn/reflux symptoms. She was  hospitalized back in 2013 for self-limited episode of ischemic colitis. Since this time there has been no issues with GI bleeding. She reports over the past few weeks she has been having minor aches and pains and has been taking a full dose aspirin as well as occasional Ibuprofen OTC. Hemoglobin this morning was 11.1. She has never had an endoscopy. No known family history of any GI malignancies.  ? ? ?Past Medical History:  ?Past Medical History:  ?Diagnosis Date  ? CHF (congestive heart failure) ([place])   ? Colitis, ischemic ([place])   ? Fibromyalgia   ? Hypertension   ? Leaky heart valve   ? Osteoarthritis   ? Rheumatoid arthritis ([place])   ?  ?Problem List: ?Patient Active Problem List  ? Diagnosis Date Noted  ? Melena 06/01/2021  ? Hypertension   ? History of ischemic colitis 2013   ? Hyperglycemia   ? Generalized anxiety disorder 04/16/2017  ? 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 Lumbar facet arthropathy 07/17/2014  ? Lumbar radicular pain 07/17/2014  ? 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  ?Past Surgical History: ?Past Surgical History:  ?Procedure Laterality Date  ? TONSILLECTOMY    ?  ?Allergies: ?Allergies  ?Allergen Reactions  ? Doxycycline Nausea And Vomiting  ? Penicillin G Swelling  ? Sulfa Antibiotics Nausea And Vomiting  ?  ?Home Medications: ?Medications Prior to Admission  ?Medication Sig Dispense Refill Last Dose  ? acetaminophen (TYLENOL) 650 MG CR tablet Take 650 mg by mouth every 8 (eight) hours as needed for pain.     ? aspirin 325 MG tablet Take 325 mg by mouth daily.     ?  venlafaxine XR (EFFEXOR-XR) 150 MG 24 hr capsule Take 150 mg by mouth daily with breakfast.     ? ?Home medication reconciliation was completed with the patient.  ? ?Continuous Inpatient Infusions: ?  ? lactated ringers 1,000 mL (06/01/21 0418)  ? ? ?PRN Inpatient Medications:  ?acetaminophen **OR** acetaminophen, ondansetron **OR** ondansetron (ZOFRAN) IV ? ?Family History: ?family history is not on file.   The patient's family history is negative for inflammatory bowel disorders, GI malignancy, or solid organ transplantation. ? ?Social History:  ? reports that she has never smoked. She does not have any smokeless tobacco history on file. She reports that she does not currently use drugs. She reports that she does not drink alcohol. The patient denies ETOH, tobacco, or drug use.  ? ?Review of Systems: ?Constitutional: Weight is stable.  ?Eyes: No changes in vision. ?ENT: No oral lesions, sore throat.  ?GI: see HPI.  ?Heme/Lymph: No easy bruising.  ?CV: No chest pain.  ?GU: No hematuria.  ?Integumentary: No rashes.  ?Neuro: No headaches.  ?Psych: No depression/anxiety.  ?Endocrine: No heat/cold intolerance.  ?Allergic/Immunologic: No urticaria.  ?Resp: No cough, SOB.  ?Musculoskeletal: No joint swelling.  ?  ?Physical Examination: ?BP (!) 165/83 (BP Location: Left Arm)   Pulse 79   Temp 98.2 ?F (36.8 ?C) (Oral)   Resp 19   Ht 5' 2"  (1.575 m)   Wt 81.6 kg   SpO2 100%   BMI 32.92 kg/m?  ?Gen: NAD, alert and oriented x 4 ?HEENT: PEERLA, EOMI, ?Neck: supple, no JVD or thyromegaly ?Chest: CTA bilaterally, no wheezes, crackles, or other adventitious sounds ?CV: RRR, no m/g/c/r ?Abd: soft, NT, ND, +BS in all four quadrants; no HSM, guarding, ridigity, or rebound tenderness ?Ext: no edema, well perfused with 2+ pulses, ?Skin: no rash or lesions noted ?Lymph: no LAD ? ?Data: ?Lab Results  ?Component Value Date  ? WBC 13.9 (H) 05/31/2021  ? HGB 11.1 (L) 06/01/2021  ? HCT 40.3 05/31/2021  ? MCV 97.3 05/31/2021  ? PLT 439 (H) 05/31/2021  ? ?Recent Labs  ?Lab 05/31/21 ?2255 06/01/21 ?9147  ?HGB 12.4 11.1*  ? ?Lab Results  ?Component Value Date  ? NA 134 (L) 05/31/2021  ? K 4.4 05/31/2021  ? CL 102 05/31/2021  ? CO2 25 05/31/2021  ? BUN 26 (H) 05/31/2021  ? CREATININE 0.84 05/31/2021  ? ?Lab Results  ?Component Value Date  ? ALT 66 (H) 05/31/2021  ? AST 39 05/31/2021  ? ALKPHOS 251 (H) 05/31/2021  ? BILITOT 0.9 05/31/2021   ? ?Recent Labs  ?Lab 05/31/21 ?2255  ?INR 0.9  ? ?Assessment/Plan: ? ?81 y/o Caucasian female with a PMH of HTN, OA, rheumatoid arthritis, Hx of ischemic colitis 2013, fibromyalgia, and CHF presented to the [facility] ED yesterday evening for chief complaint of 2 episodes of melanotic stool. GI consulted for further evaluation and management.  ? ?Melena x 2 episodes yesterday - concerning for potential UGIB. Differential includes peptic ulcer disease, gastritis +/- H pylori, erosive esophagitis, AVMs, Dieulafoy's lesion, esophageal or gastric adenocarcinoma, GAVE, etc ? ?Normocytic anemia - no recent hemoglobin for comparison, last one on file >3 years ago and was 14.3 ? ?Elevated LFTs - ALT 66, alk phos 251. No clinical suspicion for acute abdomen given negative CT scan with no evidence of biliary dilatation or cholecystitis.  ? ?Hx of ischemic colitis 2013 ? ?GAD ? ?Recommendations: ? ?- H&H hemodynamically stable currently with no evidence of overt GIB ?- Hemoglobin 11.1 this morning, suspect  mostly hemodilutional anemia  ?- Discussed potential EGD for diagnostic and potentially therapeutic interventions with patient in room today. She declined EGD after discussion.  ?- Will check H pylori Antibody as well as fractionate the alk phos  ?- Increase PPI to 40 mg BID x 1 month upon discharge and then can decrease to 40 mg once daily to treat empirically for peptic etiologies ?- Will arrange outpatient follow-up with our clinic ?- Advance diet as tolerated ?- GI will sign off at this time ? ?Thank you for the consult. Please call with questions or concerns. ? ?Geanie Kenning, PA-C ?[facility] ?[phone] ?[phone] (Cell) ? ?

## 2021-06-01 NOTE — Assessment & Plan Note (Addendum)
Hemodynamically stable on admission with  hemoglobin of 12 ?Serial H&H and transfuse if necessary ?Continue Protonix infusion. N.p.o. and IV hydration ?GI consult ?

## 2021-06-01 NOTE — Assessment & Plan Note (Signed)
As needed IV Ativan while n.p.o. ?

## 2021-06-01 NOTE — Progress Notes (Signed)
?PROGRESS NOTE ? ? ? ?Dawn [name]  TIR:[id number] DOB: 07/25/1940 DOA: 05/31/2021 ?PCP: Kirk Ruths, MD  ? ?Brief Narrative:  ?Per hpi ? Dawn [name] is a 81 y.o. female with medical history significant for Ischemic colitis in 2013 presenting with bright red blood per rectum, depression and rheumatoid arthritis who presents to the ED with a 1 day history of black tarry stool.  Had a similar episode a month prior which resolved on its own.  She has associated generalized abdominal cramping.  She denies nausea or vomiting. ?ED course and data review: BP 171/71 on arrival with pulse of 87.  WBC 13,000 with hemoglobin 12.4.  INR 0.9.  Blood glucose 291.  ALT 66 and alk phos 251.  Lactic acid 1.6.  CT abdomen and pelvis with contrast showed cholelithiasis and sigmoid diverticulosis.  Patient started on a Protonix infusion and IV fluid bolus.  Hospitalist consulted for admission.  ? ? ?Assessment & Plan: ?  ?Principal Problem: ?  Melena ?Active Problems: ?  Hyperglycemia ?  Hypertension ?  Generalized anxiety disorder ?  History of ischemic colitis 2013 ? ? ?* Melena ?Hemodynamically stable on admission with  hemoglobin of 12 ?Change PPI TO PO bid x 8 weeks then daily ?GI consulted -pt declined EGD ?Will advance diet and monitor hgb-if stable ovenright then d/c in AM ?  ?History of ischemic colitis 2013 ?CT abdomen and pelvis with contrast was nonacute ?  ?Generalized anxiety disorder ?As needed IV Ativan while n.p.o. ?  ?Hypertension ?IV as needed's ?Restart home med son dd/c while monitoring for possible gi bleed ?  ? ?DVT prophylaxis: SCD/Compression stockings  ?Code Status: full ?Full ? ? ?  ? ?  Start     Ordered  ? 06/01/21 0122  Full code  Continuous       ? 06/01/21 0122  ? ?  ?  ? ?  ? ?Code Status History   ? ? This patient has a current code status but no historical code status.  ? ?  ? ?Family Communication: none today  ?Disposition Plan:   likely one day, evaluating for gi bleed ?Consults called:  None ?Admission status: Inpatient ? ? ?Consultants:  ?gi ? ?Procedures:  ?CT ABDOMEN PELVIS W CONTRAST ? ?Result Date: 06/01/2021 ?CLINICAL DATA:  Rectal bleeding. EXAM: CT ABDOMEN AND PELVIS WITH CONTRAST TECHNIQUE: Multidetector CT imaging of the abdomen and pelvis was performed using the standard protocol following bolus administration of intravenous contrast. RADIATION DOSE REDUCTION: This exam was performed according to the departmental dose-optimization program which includes automated exposure control, adjustment of the mA and/or kV according to patient size and/or use of iterative reconstruction technique. CONTRAST:  19m OMNIPAQUE IOHEXOL 300 MG/ML  SOLN COMPARISON:  November 12, 2011 FINDINGS: Lower chest: No acute abnormality. Hepatobiliary: No focal liver abnormality is seen. Numerous tiny gallstones are seen within the lumen of a normal appearing gallbladder. There is no evidence of biliary dilatation. Pancreas: Unremarkable. No pancreatic ductal dilatation or surrounding inflammatory changes. Spleen: Normal in size without focal abnormality. Adrenals/Urinary Tract: Adrenal glands are unremarkable. Kidneys are normal in size, without renal calculi or hydronephrosis. Multiple bilateral parapelvic and parenchymal simple renal cysts are seen. No additional follow-up or imaging is recommended. Bladder is unremarkable. Stomach/Bowel: Stomach is within normal limits. Appendix appears normal. No evidence of bowel wall thickening, distention, or inflammatory changes. Noninflamed diverticula are seen throughout the sigmoid colon. Vascular/Lymphatic: Aortic atherosclerosis. No enlarged abdominal or pelvic lymph nodes. Reproductive: A stable,  calcified exophytic uterine fibroid is seen. The bilateral adnexa are unremarkable. Other: No abdominal wall hernia or abnormality. No abdominopelvic ascites. Musculoskeletal: Multilevel degenerative changes seen throughout the lumbar spine. IMPRESSION: 1. Cholelithiasis. 2.  Sigmoid diverticulosis. 3. Aortic atherosclerosis. Aortic Atherosclerosis (ICD10-I70.0). Electronically Signed   By: Virgina Norfolk M.D.   On: 06/01/2021 00:02  ? ?DG Chest Portable 1 View ? ?Result Date: 05/31/2021 ?CLINICAL DATA:  Shortness of breath. EXAM: PORTABLE CHEST 1 VIEW COMPARISON:  07/30/2014 FINDINGS: The cardiomediastinal contours are normal. Aortic atherosclerosis. There is vascular congestion. No consolidation, pleural effusion, or pneumothorax. Mild chronic elevation of right hemidiaphragm. Chronic right shoulder arthropathy. No acute osseous abnormalities are seen. IMPRESSION: Vascular congestion. Electronically Signed   By: Keith Rake M.D.   On: 05/31/2021 23:43   ? ? ? ? ?Subjective: ?No acute events, no hematochezia ?Monitoring hgb ? ?Objective: ?Vitals:  ? 06/01/21 0129 06/01/21 0215 06/01/21 1046 06/01/21 1157  ?BP: (!) 145/79 (!) 165/83 (!) 145/79 (!) 154/62  ?Pulse: 79 79 76 83  ?Resp: 16 19  16   ?Temp: 98.1 ?F (36.7 ?C) 98.2 ?F (36.8 ?C)    ?TempSrc: Oral Oral    ?SpO2: 98% 100% 96% 96%  ?Weight:      ?Height:      ? ? ?Intake/Output Summary (Last 24 hours) at 06/01/2021 1431 ?Last data filed at 06/01/2021 1358 ?Gross per 24 hour  ?Intake 1792.38 ml  ?Output --  ?Net 1792.38 ml  ? ?Filed Weights  ? 05/31/21 2244  ?Weight: 81.6 kg  ? ? ?Examination: ? ?General exam: Appears calm and comfortable  ?Respiratory system: Clear to auscultation. Respiratory effort normal. ?Cardiovascular system: S1 & S2 heard, RRR. No JVD, murmurs, rubs, gallops or clicks. No pedal edema. ?Gastrointestinal system: Abdomen is nondistended, quiet bs ?Central nervous system: Alert and oriented. No focal neurological deficits. ?Extremities: wwp no edema ?Skin: No rashes, lesions or ulcers ?Psychiatry: Judgement and insight appear normal. Mood & affect appropriate.  ? ? ? ?Data Reviewed: I have personally reviewed following labs and imaging studies ? ?CBC: ?Recent Labs  ?Lab 05/31/21 ?2255 06/01/21 ?1761  ?WBC  13.9*  --   ?NEUTROABS 9.5*  --   ?HGB 12.4 11.1*  ?HCT 40.3  --   ?MCV 97.3  --   ?PLT 439*  --   ? ?Basic Metabolic Panel: ?Recent Labs  ?Lab 05/31/21 ?2255  ?NA 134*  ?K 4.4  ?CL 102  ?CO2 25  ?GLUCOSE 291*  ?BUN 26*  ?CREATININE 0.84  ?CALCIUM 9.1  ? ?GFR: ?Estimated Creatinine Clearance: 52.9 mL/min (by C-G formula based on SCr of 0.84 mg/dL). ?Liver Function Tests: ?Recent Labs  ?Lab 05/31/21 ?2255  ?AST 39  ?ALT 66*  ?ALKPHOS 251*  ?BILITOT 0.9  ?PROT 6.7  ?ALBUMIN 3.5  ? ?No results for input(s): LIPASE, AMYLASE in the last 168 hours. ?No results for input(s): AMMONIA in the last 168 hours. ?Coagulation Profile: ?Recent Labs  ?Lab 05/31/21 ?2255  ?INR 0.9  ? ?Cardiac Enzymes: ?No results for input(s): CKTOTAL, CKMB, CKMBINDEX, TROPONINI in the last 168 hours. ?BNP (last 3 results) ?No results for input(s): PROBNP in the last 8760 hours. ?HbA1C: ?Recent Labs  ?  06/01/21 ?0634  ?HGBA1C 6.5*  ? ?CBG: ?Recent Labs  ?Lab 06/01/21 ?6073  ?GLUCAP 183*  ? ?Lipid Profile: ?No results for input(s): CHOL, HDL, LDLCALC, TRIG, CHOLHDL, LDLDIRECT in the last 72 hours. ?Thyroid Function Tests: ?No results for input(s): TSH, T4TOTAL, FREET4, T3FREE, THYROIDAB in the last 72 hours. ?Anemia  Panel: ?No results for input(s): VITAMINB12, FOLATE, FERRITIN, TIBC, IRON, RETICCTPCT in the last 72 hours. ?Sepsis Labs: ?Recent Labs  ?Lab 05/31/21 ?2357 06/01/21 ?2297  ?LATICACIDVEN 1.6 1.0  ? ? ?No results found for this or any previous visit (from the past 240 hour(s)).  ? ? ? ? ? ?Radiology Studies: ?CT ABDOMEN PELVIS W CONTRAST ? ?Result Date: 06/01/2021 ?CLINICAL DATA:  Rectal bleeding. EXAM: CT ABDOMEN AND PELVIS WITH CONTRAST TECHNIQUE: Multidetector CT imaging of the abdomen and pelvis was performed using the standard protocol following bolus administration of intravenous contrast. RADIATION DOSE REDUCTION: This exam was performed according to the departmental dose-optimization program which includes automated exposure control,  adjustment of the mA and/or kV according to patient size and/or use of iterative reconstruction technique. CONTRAST:  129m OMNIPAQUE IOHEXOL 300 MG/ML  SOLN COMPARISON:  November 12, 2011 FINDINGS: L

## 2021-06-01 NOTE — Assessment & Plan Note (Signed)
CT abdomen and pelvis with contrast was nonacute ?

## 2021-06-01 NOTE — Plan of Care (Signed)
Received patient from ED alert and oriented x4. No acute distress noted upon arrival, no complaints voiced. Patient settled into room, admission assessment completed. Will continue to monitor as per orders and plan of care. ?

## 2021-06-01 NOTE — Assessment & Plan Note (Signed)
IV as needed's ?

## 2021-06-01 NOTE — Progress Notes (Signed)
Cross Cover ?Melatonin ordered for sleep trazodone discontinued per patient preference.  ? ?

## 2021-06-02 DIAGNOSIS — F411 Generalized anxiety disorder: Secondary | ICD-10-CM | POA: Diagnosis not present

## 2021-06-02 DIAGNOSIS — R739 Hyperglycemia, unspecified: Secondary | ICD-10-CM

## 2021-06-02 DIAGNOSIS — Z8719 Personal history of other diseases of the digestive system: Secondary | ICD-10-CM | POA: Diagnosis not present

## 2021-06-02 DIAGNOSIS — K921 Melena: Secondary | ICD-10-CM | POA: Diagnosis not present

## 2021-06-02 LAB — BASIC METABOLIC PANEL
Anion gap: 7 (ref 5–15)
BUN: 14 mg/dL (ref 8–23)
CO2: 25 mmol/L (ref 22–32)
Calcium: 9 mg/dL (ref 8.9–10.3)
Chloride: 104 mmol/L (ref 98–111)
Creatinine, Ser: 0.55 mg/dL (ref 0.44–1.00)
GFR, Estimated: >60 mL/min (ref >60)
Glucose, Bld: 179 mg/dL — ABNORMAL HIGH (ref 70–99)
Potassium: 4.4 mmol/L (ref 3.5–5.1)
Sodium: 136 mmol/L (ref 135–145)

## 2021-06-02 LAB — CBC
HCT: 38.1 % (ref 36.0–46.0)
Hemoglobin: 12.2 g/dL (ref 12.0–15.0)
MCH: 31.1 pg (ref 26.0–34.0)
MCHC: 32 g/dL (ref 30.0–36.0)
MCV: 97.2 fL (ref 80.0–100.0)
Platelets: 399 10*3/uL (ref 150–400)
RBC: 3.92 MIL/uL (ref 3.87–5.11)
RDW: 13.9 % (ref 11.5–15.5)
WBC: 9.3 10*3/uL (ref 4.0–10.5)
nRBC: 0 % (ref 0.0–0.2)

## 2021-06-02 MED ORDER — PANTOPRAZOLE SODIUM 40 MG PO TBEC: 40.0000 mg | DELAYED_RELEASE_TABLET | Freq: Two times a day (BID) | ORAL | 1 refills | Status: DC

## 2021-06-02 NOTE — Discharge Summary (Signed)
Physician Discharge Summary  ?Dawn [name] FWY:[id number] DOB: [birth year] DOA: 05/31/2021 ? ?PCP: Dawn Ruths, MD ? ?Admit date: 05/31/2021 ?Discharge date: 06/02/2021 ? ?Admitted From: Inpatient ?Disposition: home ? ?Recommendations for Outpatient Follow-up:  ?Follow up with PCP in 1-2 weeks ?Please obtain BMP/CBC in one week ?Please follow up on the following pending results: ? ?Home Health:No ?Equipment/Devices:no new equipemnt  ?Discharge Condition:Stable ?CODE STATUS:Full code ?Diet recommendation: Regular healthy diet ? ?Brief/Interim Summary: ?Per hpi ? Dawn [name] is a 81 y.o. female with medical history significant for Ischemic colitis in 2013 presenting with bright red blood per rectum, depression and rheumatoid arthritis who presents to the ED with a 1 day history of black tarry stool.  Had a similar episode a month prior which resolved on its own.  She has associated generalized abdominal cramping.  She denies nausea or vomiting. ?ED course and data review: BP 171/71 on arrival with pulse of 87.  WBC 13,000 with hemoglobin 12.4.  INR 0.9.  Blood glucose 291.  ALT 66 and alk phos 251.  Lactic acid 1.6.  CT abdomen and pelvis with contrast showed cholelithiasis and sigmoid diverticulosis.  Patient started on a Protonix infusion and IV fluid bolus.  Hospitalist consulted for admission. ? ?After admission patient was Protonix drip, made n.p.o., and seen by GI.  Initial plan was for EGD but patient has declined interventional evaluation.  She was monitored overnight, hemoglobin is actually increased to 12.2.  She tolerated diet and no episodes of bleeding.  Patient will be discharged home on a PPI twice daily x8 weeks and follow-up with her PCP and GI as instructed. ? ?While in the hospital patient continue her regular medications without changes. ? ?Discharge Diagnoses:  ?Principal Problem: ?  Melena ?Active Problems: ?  Hyperglycemia ?  Hypertension ?  Generalized anxiety disorder ?  History of  ischemic colitis 2013 ? ? ? ?Discharge Instructions ? ?Discharge Instructions   ? ? Call MD for:   Complete by: As directed ?  ? For any acute change in medical condition to include but not limited to nausea vomiting fevers chills chest pain shortness of breath or any bleeding  ? Diet - low sodium heart healthy   Complete by: As directed ?  ? Discharge instructions   Complete by: As directed ?  ? Pt to return to ER for any acute change in medical condition to include any bleeding  ? Increase activity slowly   Complete by: As directed ?  ? ?  ? ?Allergies as of 06/02/2021   ? ?   Reactions  ? Doxycycline Nausea And Vomiting  ? Penicillin G Swelling  ? Sulfa Antibiotics Nausea And Vomiting  ? ?  ? ?  ?Medication List  ?  ? ?STOP taking these medications   ? ?aspirin 325 MG tablet ?  ? ?  ? ?TAKE these medications   ? ?acetaminophen 650 MG CR tablet ?Commonly known as: TYLENOL ?Take 650 mg by mouth every 8 (eight) hours as needed for pain. ?  ?pantoprazole 40 MG tablet ?Commonly known as: PROTONIX ?Take 1 tablet (40 mg total) by mouth 2 (two) times daily. ?  ?venlafaxine XR 150 MG 24 hr capsule ?Commonly known as: EFFEXOR-XR ?Take 150 mg by mouth daily with breakfast. ?  ? ?  ? ? ?Allergies  ?Allergen Reactions  ? Doxycycline Nausea And Vomiting  ? Penicillin G Swelling  ? Sulfa Antibiotics Nausea And Vomiting  ? ? ?Consultations: ?GI ? ? ?Procedures/Studies: ?  CT ABDOMEN PELVIS W CONTRAST ? ?Result Date: 06/01/2021 ?CLINICAL DATA:  Rectal bleeding. EXAM: CT ABDOMEN AND PELVIS WITH CONTRAST TECHNIQUE: Multidetector CT imaging of the abdomen and pelvis was performed using the standard protocol following bolus administration of intravenous contrast. RADIATION DOSE REDUCTION: This exam was performed according to the departmental dose-optimization program which includes automated exposure control, adjustment of the mA and/or kV according to patient size and/or use of iterative reconstruction technique. CONTRAST:  171m  OMNIPAQUE IOHEXOL 300 MG/ML  SOLN COMPARISON:  November 12, 2011 FINDINGS: Lower chest: No acute abnormality. Hepatobiliary: No focal liver abnormality is seen. Numerous tiny gallstones are seen within the lumen of a normal appearing gallbladder. There is no evidence of biliary dilatation. Pancreas: Unremarkable. No pancreatic ductal dilatation or surrounding inflammatory changes. Spleen: Normal in size without focal abnormality. Adrenals/Urinary Tract: Adrenal glands are unremarkable. Kidneys are normal in size, without renal calculi or hydronephrosis. Multiple bilateral parapelvic and parenchymal simple renal cysts are seen. No additional follow-up or imaging is recommended. Bladder is unremarkable. Stomach/Bowel: Stomach is within normal limits. Appendix appears normal. No evidence of bowel wall thickening, distention, or inflammatory changes. Noninflamed diverticula are seen throughout the sigmoid colon. Vascular/Lymphatic: Aortic atherosclerosis. No enlarged abdominal or pelvic lymph nodes. Reproductive: A stable, calcified exophytic uterine fibroid is seen. The bilateral adnexa are unremarkable. Other: No abdominal wall hernia or abnormality. No abdominopelvic ascites. Musculoskeletal: Multilevel degenerative changes seen throughout the lumbar spine. IMPRESSION: 1. Cholelithiasis. 2. Sigmoid diverticulosis. 3. Aortic atherosclerosis. Aortic Atherosclerosis (ICD10-I70.0). Electronically Signed   By: TVirgina NorfolkM.D.   On: 06/01/2021 00:02  ? ?DG Chest Portable 1 View ? ?Result Date: 05/31/2021 ?CLINICAL DATA:  Shortness of breath. EXAM: PORTABLE CHEST 1 VIEW COMPARISON:  07/30/2014 FINDINGS: The cardiomediastinal contours are normal. Aortic atherosclerosis. There is vascular congestion. No consolidation, pleural effusion, or pneumothorax. Mild chronic elevation of right hemidiaphragm. Chronic right shoulder arthropathy. No acute osseous abnormalities are seen. IMPRESSION: Vascular congestion.  Electronically Signed   By: MKeith RakeM.D.   On: 05/31/2021 23:43   ? ? ? ?Subjective: ? ? ?Discharge Exam: ?Vitals:  ? 06/02/21 0335 06/02/21 0732  ?BP: (!) 142/75 (!) 147/69  ?Pulse: 79 76  ?Resp: 20 19  ?Temp: 98.1 ?F (36.7 ?C) 98 ?F (36.7 ?C)  ?SpO2: 96% 97%  ? ?Vitals:  ? 06/01/21 2350 06/01/21 2353 06/02/21 0335 06/02/21 0732  ?BP:  (!) 143/74 (!) 142/75 (!) 147/69  ?Pulse: 81 78 79 76  ?Resp: 20  20 19   ?Temp: 98.1 ?F (36.7 ?C) 98.1 ?F (36.7 ?C) 98.1 ?F (36.7 ?C) 98 ?F (36.7 ?C)  ?TempSrc:  Oral  Oral  ?SpO2: 95% 97% 96% 97%  ?Weight:      ?Height:      ? ? ?General: Pt is alert, awake, not in acute distress ?Cardiovascular: RRR, S1/S2 +, no rubs, no gallops ?Respiratory: CTA bilaterally, no wheezing, no rhonchi ?Abdominal: Soft, NT, ND, bowel sounds + ?Extremities: no edema, no cyanosis ? ? ? ?The results of significant diagnostics from this hospitalization (including imaging, microbiology, ancillary and laboratory) are listed below for reference.   ? ? ?Microbiology: ?No results found for this or any previous visit (from the past 240 hour(s)).  ? ?Labs: ?BNP (last 3 results) ?No results for input(s): BNP in the last 8760 hours. ?Basic Metabolic Panel: ?Recent Labs  ?Lab 05/31/21 ?2255 06/02/21 ?06203 ?NA 134* 136  ?K 4.4 4.4  ?CL 102 104  ?CO2 25 25  ?GLUCOSE 291* 179*  ?  BUN 26* 14  ?CREATININE 0.84 0.55  ?CALCIUM 9.1 9.0  ? ?Liver Function Tests: ?Recent Labs  ?Lab 05/31/21 ?2255  ?AST 39  ?ALT 66*  ?ALKPHOS 251*  ?BILITOT 0.9  ?PROT 6.7  ?ALBUMIN 3.5  ? ?No results for input(s): LIPASE, AMYLASE in the last 168 hours. ?No results for input(s): AMMONIA in the last 168 hours. ?CBC: ?Recent Labs  ?Lab 05/31/21 ?2255 06/01/21 ?5027 06/02/21 ?1423  ?WBC 13.9*  --  9.3  ?NEUTROABS 9.5*  --   --   ?HGB 12.4 11.1* 12.2  ?HCT 40.3  --  38.1  ?MCV 97.3  --  97.2  ?PLT 439*  --  399  ? ?Cardiac Enzymes: ?No results for input(s): CKTOTAL, CKMB, CKMBINDEX, TROPONINI in the last 168 hours. ?BNP: ?Invalid  input(s): POCBNP ?CBG: ?Recent Labs  ?Lab 06/01/21 ?2009 06/01/21 ?1642  ?GLUCAP 183* 150*  ? ?D-Dimer ?No results for input(s): DDIMER in the last 72 hours. ?Hgb A1c ?Recent Labs  ?  06/01/21 ?0634  ?HGBA1C 6.5*  ? ?Lipid Profil

## 2021-06-03 ENCOUNTER — Emergency Department
Admission: EM | Admit: 2021-06-03 | Discharge: 2021-06-03 | Discharge status: Against Medical Advice | Payer: 59 | Attending: Student in an Organized Health Care Education/Training Program | Admitting: Student in an Organized Health Care Education/Training Program

## 2021-06-03 ENCOUNTER — Encounter: Payer: Self-pay | Admitting: Emergency Medicine

## 2021-06-03 ENCOUNTER — Encounter: Payer: Self-pay | Admitting: Internal Medicine

## 2021-06-03 ENCOUNTER — Other Ambulatory Visit: Payer: Self-pay

## 2021-06-03 ENCOUNTER — Telehealth: Payer: Self-pay

## 2021-06-03 ENCOUNTER — Ambulatory Visit (INDEPENDENT_AMBULATORY_CARE_PROVIDER_SITE_OTHER): Payer: 59 | Admitting: Internal Medicine

## 2021-06-03 VITALS — BP 122/66 | HR 104 | Temp 97.8°F | Resp 16 | Ht 63.0 in | Wt 205.5 lb

## 2021-06-03 DIAGNOSIS — K921 Melena: Secondary | ICD-10-CM | POA: Diagnosis not present

## 2021-06-03 DIAGNOSIS — Z5321 Procedure and treatment not carried out due to patient leaving prior to being seen by health care provider: Secondary | ICD-10-CM | POA: Insufficient documentation

## 2021-06-03 DIAGNOSIS — F331 Major depressive disorder, recurrent, moderate: Secondary | ICD-10-CM

## 2021-06-03 DIAGNOSIS — Z09 Encounter for follow-up examination after completed treatment for conditions other than malignant neoplasm: Secondary | ICD-10-CM

## 2021-06-03 DIAGNOSIS — M329 Systemic lupus erythematosus, unspecified: Secondary | ICD-10-CM | POA: Insufficient documentation

## 2021-06-03 DIAGNOSIS — R110 Nausea: Secondary | ICD-10-CM | POA: Diagnosis not present

## 2021-06-03 DIAGNOSIS — R1032 Left lower quadrant pain: Secondary | ICD-10-CM | POA: Diagnosis not present

## 2021-06-03 DIAGNOSIS — F411 Generalized anxiety disorder: Secondary | ICD-10-CM

## 2021-06-03 DIAGNOSIS — F4310 Post-traumatic stress disorder, unspecified: Secondary | ICD-10-CM

## 2021-06-03 DIAGNOSIS — R1031 Right lower quadrant pain: Secondary | ICD-10-CM | POA: Insufficient documentation

## 2021-06-03 DIAGNOSIS — R1030 Lower abdominal pain, unspecified: Secondary | ICD-10-CM | POA: Diagnosis present

## 2021-06-03 DIAGNOSIS — M797 Fibromyalgia: Secondary | ICD-10-CM

## 2021-06-03 LAB — COMPREHENSIVE METABOLIC PANEL
ALT: 58 U/L — ABNORMAL HIGH (ref 0–44)
AST: 44 U/L — ABNORMAL HIGH (ref 15–41)
Albumin: 3.6 g/dL (ref 3.5–5.0)
Alkaline Phosphatase: 225 U/L — ABNORMAL HIGH (ref 38–126)
Anion gap: 9 (ref 5–15)
BUN: 20 mg/dL (ref 8–23)
CO2: 26 mmol/L (ref 22–32)
Calcium: 9.1 mg/dL (ref 8.9–10.3)
Chloride: 102 mmol/L (ref 98–111)
Creatinine, Ser: 0.91 mg/dL (ref 0.44–1.00)
GFR, Estimated: >60 mL/min (ref >60)
Glucose, Bld: 153 mg/dL — ABNORMAL HIGH (ref 70–99)
Potassium: 4.6 mmol/L (ref 3.5–5.1)
Sodium: 137 mmol/L (ref 135–145)
Total Bilirubin: 0.9 mg/dL (ref 0.3–1.2)
Total Protein: 6.7 g/dL (ref 6.5–8.1)

## 2021-06-03 LAB — CBC
HCT: 37.1 % (ref 36.0–46.0)
Hemoglobin: 11.6 g/dL — ABNORMAL LOW (ref 12.0–15.0)
MCH: 30.5 pg (ref 26.0–34.0)
MCHC: 31.3 g/dL (ref 30.0–36.0)
MCV: 97.6 fL (ref 80.0–100.0)
Platelets: 398 10*3/uL (ref 150–400)
RBC: 3.8 MIL/uL — ABNORMAL LOW (ref 3.87–5.11)
RDW: 13.8 % (ref 11.5–15.5)
WBC: 10.4 10*3/uL (ref 4.0–10.5)
nRBC: 0 % (ref 0.0–0.2)

## 2021-06-03 LAB — LIPASE, BLOOD: Lipase: 28 U/L (ref 11–51)

## 2021-06-03 MED ORDER — VENLAFAXINE HCL ER 75 MG PO CP24: 75.0000 mg | ORAL_CAPSULE | Freq: Every day | ORAL | 1 refills | Status: DC

## 2021-06-03 MED ORDER — ONDANSETRON 8 MG PO TBDP: 8.0000 mg | ORAL_TABLET | Freq: Three times a day (TID) | ORAL | 0 refills | Status: DC | PRN

## 2021-06-03 NOTE — Telephone Encounter (Signed)
Patient forgot to ask you about getting an Aid to help her. She has scheduled for next week but wanted to see if you could get this started for her ASAP. She has had one before about 6 months ago.

## 2021-06-03 NOTE — Progress Notes (Signed)
? ?New Patient Office Visit ? ?Subjective:  ?Patient ID: Dawn [name], female    DOB: May 01, 1940  Age: 81 y.o. MRN: [id number] ? ?CC:  ?Chief Complaint  ?Patient presents with  ? Establish Care  ?  W/ labs  ? Hospitalization Follow-up  ?  GI bleed  ? ? ?HPI ?Dawn [name] presents as a new patient. She was recently seen in the hospital for GI bleed. ? ?Discharge Date: 06/02/21 ?Hospital/facility: [facility] ?Diagnosis: GI bleed ?Procedures/tests: WBC 13, hemoglobin 12.4.  INR 0.9.  Blood glucose 291.  ALT 66 and alk phos 251.  Lactic acid 1.6.  CT abdomen and pelvis with contrast showed cholelithiasis and sigmoid diverticulosis.  ?Consultants: GI - planned EGD but patient declined ?New medications: Protonix 40 BID for 1 month, then 40 mg once a day ?Discontinued medications: Aspirin ?Discharge instructions:  Recheck CMP, BMP in 1 week  ?Status: better ? ?Had 1 day of black tarry stools (twice in 1 day). Had this once the month before and it self resolved. Was endorsing generalized abdominal cramping prior to the episodes. Severity was mild, about 2/10, intermittent. Did have some nausea, but no vomiting. Still had some nausea this morning. Did have a normal BM without blood or dark colored stools.  ? ?Hypertension/CHF: ?-Medications: None ?-Was diagnosed with HTN and CHF in the past but then was told she didn't have CHF ? Unable to view records  ?-Denies chest pain, shortness of breath, dizziness, leg swelling ? ?RA/Lupus/Fibromyalgia: ?-Reviewed labs from 2016, ANA negative, RF positive, inflammatory markers negative ?-Not seeing Rheumatology currently or on any treatment  ? ?MDD: ?-Mood status: exacerbated ?-Current treatment: Effexor XR 150 ?-Satisfied with current treatment?: no ?-Symptom severity: moderate ?-Duration of current treatment : years - had been on Effexor 225 for years, decreased in the hospital ?-Side effects: no ?Medication compliance: excellent compliance ?Psychotherapy/counseling: no in the  past ?Depressed mood: yes ?Anxious mood: yes ? ? ?  06/03/2021  ?  9:20 AM  ?Depression screen PHQ 2/9  ?Decreased Interest 0  ?Down, Depressed, Hopeless 2  ?PHQ - 2 Score 2  ?Altered sleeping 3  ?Tired, decreased energy 3  ?Change in appetite 0  ?Feeling bad or failure about yourself  1  ?Trouble concentrating 3  ?Moving slowly or fidgety/restless 0  ?Suicidal thoughts 0  ?PHQ-9 Score 12  ?Difficult doing work/chores Somewhat difficult  ? ?Health Maintenance: ?-Blood work due ?-Mammogram/DEXA not interested in screening at this time ? ?Past Medical History:  ?Diagnosis Date  ? CHF (congestive heart failure) ([place])   ? Colitis, ischemic ([place])   ? Fibromyalgia   ? Hypertension   ? Leaky heart valve   ? Lupus ([place])   ? Osteoarthritis   ? PTSD (post-traumatic stress disorder)   ? Rheumatoid arthritis ([place])   ? ? ?Past Surgical History:  ?Procedure Laterality Date  ? TONSILLECTOMY    ? ? ?Family History  ?Problem Relation Age of Onset  ? Cancer Mother   ? Alzheimer's disease Mother   ? Cancer Father   ?     prostate  ? ADD / ADHD Son   ? ? ?Social History  ? ?Socioeconomic History  ? Marital status: Married  ?  Spouse name: Not on file  ? Number of children: Not on file  ? Years of education: Not on file  ? Highest education level: Not on file  ?Occupational History  ? Not on file  ?Tobacco Use  ? Smoking status: Never  ?  Smokeless tobacco: Not on file  ?Vaping Use  ? Vaping Use: Never used  ?Substance and Sexual Activity  ? Alcohol use: No  ? Drug use: Not Currently  ? Sexual activity: Not Currently  ?Other Topics Concern  ? Not on file  ?Social History Narrative  ? Not on file  ? ?Social Determinants of Health  ? ?Financial Resource Strain: Not on file  ?Food Insecurity: Not on file  ?Transportation Needs: Not on file  ?Physical Activity: Not on file  ?Stress: Not on file  ?Social Connections: Not on file  ?Intimate Partner Violence: Not on file  ? ? ?ROS ?Review of Systems  ?Constitutional:  Positive for fatigue.  Negative for chills and fever.  ?Eyes:  Negative for visual disturbance.  ?Respiratory:  Negative for cough and shortness of breath.   ?Cardiovascular:  Negative for chest pain, palpitations and leg swelling.  ?Gastrointestinal:  Positive for abdominal pain, blood in stool, nausea and vomiting. Negative for constipation and diarrhea.  ?Genitourinary:  Negative for dysuria and hematuria.  ?Musculoskeletal:  Positive for arthralgias.  ?Neurological:  Negative for dizziness and headaches.  ?Psychiatric/Behavioral:  Positive for decreased concentration and sleep disturbance.   ? ?Objective:  ? ?Today's Vitals: BP 122/66   Pulse (!) 104   Temp 97.8 ?F (36.6 ?C)   Resp 16   Ht 5' 3"  (1.6 m)   Wt 205 lb 8 oz (93.2 kg)   SpO2 93%   BMI 36.40 kg/m?  ? ?Physical Exam ?Constitutional:   ?   Appearance: Normal appearance.  ?HENT:  ?   Head: Normocephalic and atraumatic.  ?Eyes:  ?   Conjunctiva/sclera: Conjunctivae normal.  ?Cardiovascular:  ?   Rate and Rhythm: Normal rate and regular rhythm.  ?Pulmonary:  ?   Effort: Pulmonary effort is normal.  ?   Breath sounds: Normal breath sounds.  ?Abdominal:  ?   General: Bowel sounds are normal. There is no distension.  ?   Palpations: Abdomen is soft.  ?   Tenderness: There is no right CVA tenderness, left CVA tenderness, guarding or rebound.  ?   Comments: RUQ and LUQ pain to palpation   ?Musculoskeletal:  ?   Right lower leg: No edema.  ?   Left lower leg: No edema.  ?Skin: ?   General: Skin is warm and dry.  ?Neurological:  ?   General: No focal deficit present.  ?   Mental Status: She is alert. Mental status is at baseline.  ?Psychiatric:     ?   Mood and Affect: Mood normal.     ?   Behavior: Behavior normal.  ? ? ?Assessment & Plan:  ? ?Problem List Items Addressed This Visit   ? ?  ? Digestive  ? Melena  ?  Hgb stable, started on Protonix 40 mg BID for one month, then decreased to 40 mg daily. Discussed avoiding aspiring and NSAIDs. Also discussed GI referral for  possible EGD - patient initially refused this test in the hospital but if this happens again she would need this test for a definitive diagnosis, patient understands and agrees. Reviewed symptoms that would prompt return to the ER. ? ?  ?  ? Relevant Orders  ? Ambulatory referral to Gastroenterology  ?  ? Other  ? Generalized anxiety disorder  ?  Increase Effexor back to 225 mg daily, 150 in the am and 75 in the pm.  ? ?  ?  ? Relevant Medications  ? venlafaxine XR (  EFFEXOR-XR) 75 MG 24 hr capsule  ? Major depressive disorder  ?  Continue Effexor 225. ? ?  ?  ? Relevant Medications  ? venlafaxine XR (EFFEXOR-XR) 75 MG 24 hr capsule  ? PTSD (post-traumatic stress disorder)  ?  Continue Effexor 225, discuss therapy at follow up.  ? ?  ?  ? Relevant Medications  ? venlafaxine XR (EFFEXOR-XR) 75 MG 24 hr capsule  ? Fibromyalgia  ?  Plan on repeating autoimmune labs at follow up next week as she needs repeat CMP and CBC then anyway. ? ?  ?  ? Relevant Medications  ? venlafaxine XR (EFFEXOR-XR) 75 MG 24 hr capsule  ? ?Other Visit Diagnoses   ? ? Hospital discharge follow-up    -  Primary - Reviewed all notes, labs and imaging from recent hospitalization.   ? Nausea      ? Relevant Medications  ? ondansetron (ZOFRAN-ODT) 8 MG disintegrating tablet  ? ?  ? ? ?Outpatient Encounter Medications as of 06/03/2021  ?Medication Sig  ? acetaminophen (TYLENOL) 650 MG CR tablet Take 650 mg by mouth every 8 (eight) hours as needed for pain.  ? ondansetron (ZOFRAN-ODT) 8 MG disintegrating tablet Take 1 tablet (8 mg total) by mouth every 8 (eight) hours as needed for nausea or vomiting.  ? pantoprazole (PROTONIX) 40 MG tablet Take 1 tablet (40 mg total) by mouth 2 (two) times daily.  ? venlafaxine XR (EFFEXOR-XR) 150 MG 24 hr capsule Take 150 mg by mouth daily with breakfast.  ? venlafaxine XR (EFFEXOR-XR) 75 MG 24 hr capsule Take 1 capsule (75 mg total) by mouth daily with breakfast.  ? ?No facility-administered encounter medications on  file as of 06/03/2021.  ? ? ?Follow-up: Return in about 1 week (around 06/10/2021).  ? ?Teodora Medici, DO ? ?

## 2021-06-03 NOTE — Assessment & Plan Note (Signed)
Continue Effexor 225. ?

## 2021-06-03 NOTE — Patient Instructions (Addendum)
It was great seeing you today! ? ?Plan discussed at today's visit: ?-Continue Protonix 40 mg twice a day for 1 month, then decrease to 40 mg once a day ?-Zofran sent to pharmacy to take as needed for nausea ?-Effexor: take 150 mg in the morning and 75 in the afternoon ?-Referral to GI placed ?-Next week, prepare for blood work and possibly pneumonia vaccine  ?-If you experience dark stools again, you need to go to emergency room ?-No aspirin or anti-inflammatory medications like Ibuprofen, Aleve, etc. Can take Tylenol  ? ?Follow up in: 1 week ? ?Take care and let us know if you have any questions or concerns prior to your next visit. ? ?Dr. Caralee Ates ? ?Pneumococcal Conjugate Vaccine (Prevnar 20) Suspension for Injection ?What is this medication? ?PNEUMOCOCCAL VACCINE (NEU mo KOK al vak SEEN) is a vaccine. It prevents pneumococcus bacterial infections. These bacteria can cause serious infections like pneumonia, meningitis, and blood infections. This vaccine will not treat an infection and will not cause infection. This vaccine is recommended for adults 18 years and older. ?This medicine may be used for other purposes; ask your health care provider or pharmacist if you have questions. ?COMMON BRAND NAME(S): Prevnar 20 ?What should I tell my care team before I take this medication? ?They need to know if you have any of these conditions: ?bleeding disorder ?fever ?immune system problems ?an unusual or allergic reaction to pneumococcal vaccine, diphtheria toxoid, other vaccines, other medicines, foods, dyes, or preservatives ?pregnant or trying to get pregnant ?breast-feeding ?How should I use this medication? ?This vaccine is injected into a muscle. It is given by a health care provider. ?A copy of Vaccine Information Statements will be given before each vaccination. Be sure to read this information carefully each time. This sheet may change often. ?Talk to your health care provider about the use of this medicine in  children. Special care may be needed. ?Overdosage: If you think you have taken too much of this medicine contact a poison control center or emergency room at once. ?NOTE: This medicine is only for you. Do not share this medicine with others. ?What if I miss a dose? ?This does not apply. This medicine is not for regular use. ?What may interact with this medication? ?medicines for cancer chemotherapy ?medicines that suppress your immune function ?steroid medicines like prednisone or cortisone ?This list may not describe all possible interactions. Give your health care provider a list of all the medicines, herbs, non-prescription drugs, or dietary supplements you use. Also tell them if you smoke, drink alcohol, or use illegal drugs. Some items may interact with your medicine. ?What should I watch for while using this medication? ?Mild fever and pain should go away in 3 days or less. Report any unusual symptoms to your health care provider. ?What side effects may I notice from receiving this medication? ?Side effects that you should report to your doctor or health care professional as soon as possible: ?allergic reactions (skin rash, itching or hives; swelling of the face, lips, or tongue) ?confusion ?fast, irregular heartbeat ?fever over 102 degrees F ?muscle weakness ?seizures ?trouble breathing ?unusual bruising or bleeding ?Side effects that usually do not require medical attention (report to your doctor or health care professional if they continue or are bothersome): ?fever of 102 degrees F or less ?headache ?joint pain ?muscle cramps, pain ?pain, tender at site where injected ?This list may not describe all possible side effects. Call your doctor for medical advice about side effects. You  may report side effects to FDA at 1-800-FDA-1088. ?Where should I keep my medication? ?This vaccine is only given by a health care provider. It will not be stored at home. ?NOTE: This sheet is a summary. It may not cover all  possible information. If you have questions about this medicine, talk to your doctor, pharmacist, or health care provider. ?? 2023 Elsevier/Gold Standard (2019-10-20 00:00:00) ?

## 2021-06-03 NOTE — Assessment & Plan Note (Signed)
Plan on repeating autoimmune labs at follow up next week as she needs repeat CMP and CBC then anyway. ?

## 2021-06-03 NOTE — ED Triage Notes (Signed)
Pt to ED via POV, states was admitted for GI bleed, pt states had lower abd pain x 5 mins, pt states has not a BM today, last BM yesterday. Pt having "little sharp pains" pt states pain lasts "a second or two" and moves from RLQ to LLQ. Pt states she is afraid she will have a black stool and not know what to do.  ? ?Pt states was taking ASA and that caused GI bleed. Pt states hx of PTSD, asking to take her medications in triage, explained prefer patient to wait until evaluated by provider.  ?

## 2021-06-03 NOTE — Assessment & Plan Note (Signed)
Hgb stable, started on Protonix 40 mg BID for one month, then decreased to 40 mg daily. Discussed avoiding aspiring and NSAIDs. Also discussed GI referral for possible EGD - patient initially refused this test in the hospital but if this happens again she would need this test for a definitive diagnosis, patient understands and agrees. Reviewed symptoms that would prompt return to the ER. ?

## 2021-06-03 NOTE — Assessment & Plan Note (Signed)
Increase Effexor back to 225 mg daily, 150 in the am and 75 in the pm.  ?

## 2021-06-03 NOTE — Addendum Note (Signed)
Addended by: Teodora Medici on: 06/03/2021 11:07 AM ? ? Modules accepted: Orders ? ?

## 2021-06-03 NOTE — Assessment & Plan Note (Signed)
Continue Effexor 225, discuss therapy at follow up.  ?

## 2021-06-04 ENCOUNTER — Telehealth: Payer: Self-pay

## 2021-06-04 LAB — H PYLORI, IGM, IGG, IGA AB
H Pylori IgG: 0.41 Index Value (ref 0.00–0.79)
H. Pylogi, Iga Abs: <9 units (ref 0.0–8.9)
H. Pylogi, Igm Abs: <9 units (ref 0.0–8.9)

## 2021-06-04 LAB — MISC LABCORP TEST (SEND OUT): Labcorp test code: 1612

## 2021-06-04 NOTE — Telephone Encounter (Signed)
Had a call a a nurse report this morning about patient having pain in the abdominal  with no fever vomiting or diarrhea. Pain below naval and moved from side to side. Lvm to see about getting patient an appt if she would like.

## 2021-06-11 ENCOUNTER — Ambulatory Visit: Payer: 59 | Admitting: Internal Medicine

## 2021-06-14 ENCOUNTER — Ambulatory Visit: Payer: 59 | Admitting: Internal Medicine

## 2021-06-14 ENCOUNTER — Telehealth: Payer: Self-pay

## 2021-06-14 DIAGNOSIS — F411 Generalized anxiety disorder: Secondary | ICD-10-CM

## 2021-06-14 DIAGNOSIS — F4310 Post-traumatic stress disorder, unspecified: Secondary | ICD-10-CM

## 2021-06-14 NOTE — Telephone Encounter (Signed)
Copied from CRM [id number]. Topic: Referral - Request for Referral ?>> Jun 14, 2021  9:35 AM Gaetana Michaelis A wrote: ?Has patient seen PCP for this complaint? Yes.   ?*If NO, is insurance requiring patient see PCP for this issue before PCP can refer them? ?Referral for which specialty: Psychiatry / Behavioral Health  ?Preferred provider/office: Patient has no preference  ?Reason for referral: PTSD and stress concerns ?>> Jun 14, 2021  9:55 AM Violeta Gelinas B wrote: ?Do you know if this person has been seen for this? ?

## 2021-06-20 ENCOUNTER — Ambulatory Visit: Payer: 59 | Admitting: Internal Medicine

## 2021-06-20 NOTE — Progress Notes (Deleted)
New Patient Office Visit  Subjective:  Patient ID: Dawn [name], female    DOB: March 26, 1940  Age: 81 y.o. MRN: [id number]  CC:  No chief complaint on file.   HPI Dawn [name] presents for follow up. She was recently seen in the hospital for GI bleed, was seen here and then went back to the ER for same issue. Hemoglobin 2 weeks ago from 06/03/21 11.6, lipase negative, LFTs mildly elevated with AST 44 and ALT 58, alk phos 225.   Discharge Date: 06/02/21 Hospital/facility: [facility] Diagnosis: GI bleed Procedures/tests: WBC 13, hemoglobin 12.4.  INR 0.9.  Blood glucose 291.  ALT 66 and alk phos 251.  Lactic acid 1.6.  CT abdomen and pelvis with contrast showed cholelithiasis and sigmoid diverticulosis.  Consultants: GI - planned EGD but patient declined New medications: Protonix 40 BID for 1 month, then 40 mg once a day Discontinued medications: Aspirin Discharge instructions:  Recheck CMP, BMP in 1 week  Status: better  Had 1 day of black tarry stools (twice in 1 day). Had this once the month before and it self resolved. Was endorsing generalized abdominal cramping prior to the episodes. Severity was mild, about 2/10, intermittent. Did have some nausea, but no vomiting. Still had some nausea this morning. Did have a normal BM without blood or dark colored stools.   Hypertension/CHF: -Medications: None -Was diagnosed with HTN and CHF in the past but then was told she didn't have CHF ? Unable to view records  -Denies chest pain, shortness of breath, dizziness, leg swelling  RA/Lupus/Fibromyalgia: -Reviewed labs from 2016, ANA negative, RF positive, inflammatory markers negative -Not seeing Rheumatology currently or on any treatment   MDD: -Mood status: exacerbated -Current treatment: Effexor XR 150 -Satisfied with current treatment?: no -Symptom severity: moderate -Duration of current treatment : years - had been on Effexor 225 for years, decreased in the hospital -Side effects:  no Medication compliance: excellent compliance Psychotherapy/counseling: no in the past Depressed mood: yes Anxious mood: yes     06/03/2021    9:20 AM  Depression screen PHQ 2/9  Decreased Interest 0  Down, Depressed, Hopeless 2  PHQ - 2 Score 2  Altered sleeping 3  Tired, decreased energy 3  Change in appetite 0  Feeling bad or failure about yourself  1  Trouble concentrating 3  Moving slowly or fidgety/restless 0  Suicidal thoughts 0  PHQ-9 Score 12  Difficult doing work/chores Somewhat difficult   Health Maintenance: -Blood work due -Mammogram/DEXA not interested in screening at this time  Past Medical History:  Diagnosis Date   CHF (congestive heart failure) (HCC)    Colitis, ischemic (HCC)    Fibromyalgia    Hypertension    Leaky heart valve    Lupus (HCC)    Osteoarthritis    PTSD (post-traumatic stress disorder)    Rheumatoid arthritis ([place])     Past Surgical History:  Procedure Laterality Date   TONSILLECTOMY      Family History  Problem Relation Age of Onset   Cancer Mother    Alzheimer's disease Mother    Cancer Father        prostate   ADD / ADHD Son     Social History   Socioeconomic History   Marital status: Married    Spouse name: Not on file   Number of children: Not on file   Years of education: Not on file   Highest education level: Not on file  Occupational History  Not on file  Tobacco Use   Smoking status: Never   Smokeless tobacco: Not on file  Vaping Use   Vaping Use: Never used  Substance and Sexual Activity   Alcohol use: No   Drug use: Not Currently   Sexual activity: Not Currently  Other Topics Concern   Not on file  Social History Narrative   Not on file   Social Determinants of Health   Financial Resource Strain: Not on file  Food Insecurity: Not on file  Transportation Needs: Not on file  Physical Activity: Not on file  Stress: Not on file  Social Connections: Not on file  Intimate Partner Violence:  Not on file    ROS Review of Systems  Constitutional:  Positive for fatigue. Negative for chills and fever.  Eyes:  Negative for visual disturbance.  Respiratory:  Negative for cough and shortness of breath.   Cardiovascular:  Negative for chest pain, palpitations and leg swelling.  Gastrointestinal:  Positive for abdominal pain, blood in stool, nausea and vomiting. Negative for constipation and diarrhea.  Genitourinary:  Negative for dysuria and hematuria.  Musculoskeletal:  Positive for arthralgias.  Neurological:  Negative for dizziness and headaches.  Psychiatric/Behavioral:  Positive for decreased concentration and sleep disturbance.    Objective:   Today's Vitals: There were no vitals taken for this visit.  Physical Exam Constitutional:      Appearance: Normal appearance.  HENT:     Head: Normocephalic and atraumatic.  Eyes:     Conjunctiva/sclera: Conjunctivae normal.  Cardiovascular:     Rate and Rhythm: Normal rate and regular rhythm.  Pulmonary:     Effort: Pulmonary effort is normal.     Breath sounds: Normal breath sounds.  Abdominal:     General: Bowel sounds are normal. There is no distension.     Palpations: Abdomen is soft.     Tenderness: There is no right CVA tenderness, left CVA tenderness, guarding or rebound.     Comments: RUQ and LUQ pain to palpation   Musculoskeletal:     Right lower leg: No edema.     Left lower leg: No edema.  Skin:    General: Skin is warm and dry.  Neurological:     General: No focal deficit present.     Mental Status: She is alert. Mental status is at baseline.  Psychiatric:        Mood and Affect: Mood normal.        Behavior: Behavior normal.    Assessment & Plan:   Problem List Items Addressed This Visit       Digestive   Melena    Hgb stable, started on Protonix 40 mg BID for one month, then decreased to 40 mg daily. Discussed avoiding aspiring and NSAIDs. Also discussed GI referral for possible EGD - patient  initially refused this test in the hospital but if this happens again she would need this test for a definitive diagnosis, patient understands and agrees. Reviewed symptoms that would prompt return to the ER.       Relevant Orders   Ambulatory referral to Gastroenterology     Other   Generalized anxiety disorder    Increase Effexor back to 225 mg daily, 150 in the am and 75 in the pm.        Relevant Medications   venlafaxine XR (EFFEXOR-XR) 75 MG 24 hr capsule   Major depressive disorder    Continue Effexor 225.  Relevant Medications   venlafaxine XR (EFFEXOR-XR) 75 MG 24 hr capsule   PTSD (post-traumatic stress disorder)    Continue Effexor 225, discuss therapy at follow up.        Relevant Medications   venlafaxine XR (EFFEXOR-XR) 75 MG 24 hr capsule   Fibromyalgia    Plan on repeating autoimmune labs at follow up next week as she needs repeat CMP and CBC then anyway.       Relevant Medications   venlafaxine XR (EFFEXOR-XR) 75 MG 24 hr capsule   Other Visit Diagnoses     Hospital discharge follow-up    -  Primary - Reviewed all notes, labs and imaging from recent hospitalization.    Nausea       Relevant Medications   ondansetron (ZOFRAN-ODT) 8 MG disintegrating tablet       Outpatient Encounter Medications as of 06/20/2021  Medication Sig   acetaminophen (TYLENOL) 650 MG CR tablet Take 650 mg by mouth every 8 (eight) hours as needed for pain.   ondansetron (ZOFRAN-ODT) 8 MG disintegrating tablet Take 1 tablet (8 mg total) by mouth every 8 (eight) hours as needed for nausea or vomiting.   pantoprazole (PROTONIX) 40 MG tablet Take 1 tablet (40 mg total) by mouth 2 (two) times daily.   venlafaxine XR (EFFEXOR-XR) 150 MG 24 hr capsule Take 150 mg by mouth daily with breakfast.   venlafaxine XR (EFFEXOR-XR) 75 MG 24 hr capsule Take 1 capsule (75 mg total) by mouth daily with breakfast.   No facility-administered encounter medications on file as of 06/20/2021.     Follow-up: No follow-ups on file.   Teodora Medici, DO

## 2021-06-27 ENCOUNTER — Ambulatory Visit: Payer: 59 | Admitting: Internal Medicine

## 2021-06-27 NOTE — Progress Notes (Deleted)
New Patient Office Visit  Subjective:  Patient ID: Dawn [name], female    DOB: 12-08-1940  Age: 81 y.o. MRN: [id number]  CC:  No chief complaint on file.   HPI Dawn [name] presents for follow up. She was recently seen in the hospital for GI bleed, was seen here and then went back to the ER for same issue. Hemoglobin 2 weeks ago from 06/03/21 11.6, lipase negative, LFTs mildly elevated with AST 44 and ALT 58, alk phos 225.   Discharge Date: 06/02/21 Hospital/facility: [facility] Diagnosis: GI bleed Procedures/tests: WBC 13, hemoglobin 12.4.  INR 0.9.  Blood glucose 291.  ALT 66 and alk phos 251.  Lactic acid 1.6.  CT abdomen and pelvis with contrast showed cholelithiasis and sigmoid diverticulosis.  Consultants: GI - planned EGD but patient declined New medications: Protonix 40 BID for 1 month, then 40 mg once a day Discontinued medications: Aspirin Discharge instructions:  Recheck CMP, BMP in 1 week  Status: better  Had 1 day of black tarry stools (twice in 1 day). Had this once the month before and it self resolved. Was endorsing generalized abdominal cramping prior to the episodes. Severity was mild, about 2/10, intermittent. Did have some nausea, but no vomiting. Still had some nausea this morning. Did have a normal BM without blood or dark colored stools.   Hypertension/CHF: -Medications: None -Was diagnosed with HTN and CHF in the past but then was told she didn't have CHF ? Unable to view records  -Denies chest pain, shortness of breath, dizziness, leg swelling  RA/Lupus/Fibromyalgia: -Reviewed labs from 2016, ANA negative, RF positive, inflammatory markers negative -Not seeing Rheumatology currently or on any treatment   MDD: -Mood status: exacerbated -Current treatment: Effexor XR 150 -Satisfied with current treatment?: no -Symptom severity: moderate -Duration of current treatment : years - had been on Effexor 225 for years, decreased in the hospital -Side effects:  no Medication compliance: excellent compliance Psychotherapy/counseling: no in the past Depressed mood: yes Anxious mood: yes     06/03/2021    9:20 AM  Depression screen PHQ 2/9  Decreased Interest 0  Down, Depressed, Hopeless 2  PHQ - 2 Score 2  Altered sleeping 3  Tired, decreased energy 3  Change in appetite 0  Feeling bad or failure about yourself  1  Trouble concentrating 3  Moving slowly or fidgety/restless 0  Suicidal thoughts 0  PHQ-9 Score 12  Difficult doing work/chores Somewhat difficult   Health Maintenance: -Blood work due -Mammogram/DEXA not interested in screening at this time  Past Medical History:  Diagnosis Date   CHF (congestive heart failure) (HCC)    Colitis, ischemic (HCC)    Fibromyalgia    Hypertension    Leaky heart valve    Lupus (HCC)    Osteoarthritis    PTSD (post-traumatic stress disorder)    Rheumatoid arthritis ([place])     Past Surgical History:  Procedure Laterality Date   TONSILLECTOMY      Family History  Problem Relation Age of Onset   Cancer Mother    Alzheimer's disease Mother    Cancer Father        prostate   ADD / ADHD Son     Social History   Socioeconomic History   Marital status: Married    Spouse name: Not on file   Number of children: Not on file   Years of education: Not on file   Highest education level: Not on file  Occupational History  Not on file  Tobacco Use   Smoking status: Never   Smokeless tobacco: Not on file  Vaping Use   Vaping Use: Never used  Substance and Sexual Activity   Alcohol use: No   Drug use: Not Currently   Sexual activity: Not Currently  Other Topics Concern   Not on file  Social History Narrative   Not on file   Social Determinants of Health   Financial Resource Strain: Not on file  Food Insecurity: Not on file  Transportation Needs: Not on file  Physical Activity: Not on file  Stress: Not on file  Social Connections: Not on file  Intimate Partner Violence:  Not on file    ROS Review of Systems  Constitutional:  Positive for fatigue. Negative for chills and fever.  Eyes:  Negative for visual disturbance.  Respiratory:  Negative for cough and shortness of breath.   Cardiovascular:  Negative for chest pain, palpitations and leg swelling.  Gastrointestinal:  Positive for abdominal pain, blood in stool, nausea and vomiting. Negative for constipation and diarrhea.  Genitourinary:  Negative for dysuria and hematuria.  Musculoskeletal:  Positive for arthralgias.  Neurological:  Negative for dizziness and headaches.  Psychiatric/Behavioral:  Positive for decreased concentration and sleep disturbance.    Objective:   Today's Vitals: There were no vitals taken for this visit.  Physical Exam Constitutional:      Appearance: Normal appearance.  HENT:     Head: Normocephalic and atraumatic.  Eyes:     Conjunctiva/sclera: Conjunctivae normal.  Cardiovascular:     Rate and Rhythm: Normal rate and regular rhythm.  Pulmonary:     Effort: Pulmonary effort is normal.     Breath sounds: Normal breath sounds.  Abdominal:     General: Bowel sounds are normal. There is no distension.     Palpations: Abdomen is soft.     Tenderness: There is no right CVA tenderness, left CVA tenderness, guarding or rebound.     Comments: RUQ and LUQ pain to palpation   Musculoskeletal:     Right lower leg: No edema.     Left lower leg: No edema.  Skin:    General: Skin is warm and dry.  Neurological:     General: No focal deficit present.     Mental Status: She is alert. Mental status is at baseline.  Psychiatric:        Mood and Affect: Mood normal.        Behavior: Behavior normal.    Assessment & Plan:   Problem List Items Addressed This Visit       Digestive   Melena    Hgb stable, started on Protonix 40 mg BID for one month, then decreased to 40 mg daily. Discussed avoiding aspiring and NSAIDs. Also discussed GI referral for possible EGD - patient  initially refused this test in the hospital but if this happens again she would need this test for a definitive diagnosis, patient understands and agrees. Reviewed symptoms that would prompt return to the ER.       Relevant Orders   Ambulatory referral to Gastroenterology     Other   Generalized anxiety disorder    Increase Effexor back to 225 mg daily, 150 in the am and 75 in the pm.        Relevant Medications   venlafaxine XR (EFFEXOR-XR) 75 MG 24 hr capsule   Major depressive disorder    Continue Effexor 225.  Relevant Medications   venlafaxine XR (EFFEXOR-XR) 75 MG 24 hr capsule   PTSD (post-traumatic stress disorder)    Continue Effexor 225, discuss therapy at follow up.        Relevant Medications   venlafaxine XR (EFFEXOR-XR) 75 MG 24 hr capsule   Fibromyalgia    Plan on repeating autoimmune labs at follow up next week as she needs repeat CMP and CBC then anyway.       Relevant Medications   venlafaxine XR (EFFEXOR-XR) 75 MG 24 hr capsule   Other Visit Diagnoses     Hospital discharge follow-up    -  Primary - Reviewed all notes, labs and imaging from recent hospitalization.    Nausea       Relevant Medications   ondansetron (ZOFRAN-ODT) 8 MG disintegrating tablet       Outpatient Encounter Medications as of 06/27/2021  Medication Sig   acetaminophen (TYLENOL) 650 MG CR tablet Take 650 mg by mouth every 8 (eight) hours as needed for pain.   ondansetron (ZOFRAN-ODT) 8 MG disintegrating tablet Take 1 tablet (8 mg total) by mouth every 8 (eight) hours as needed for nausea or vomiting.   pantoprazole (PROTONIX) 40 MG tablet Take 1 tablet (40 mg total) by mouth 2 (two) times daily.   venlafaxine XR (EFFEXOR-XR) 150 MG 24 hr capsule Take 150 mg by mouth daily with breakfast.   venlafaxine XR (EFFEXOR-XR) 75 MG 24 hr capsule Take 1 capsule (75 mg total) by mouth daily with breakfast.   No facility-administered encounter medications on file as of 06/27/2021.     Follow-up: No follow-ups on file.   Teodora Medici, DO

## 2021-07-01 ENCOUNTER — Encounter: Payer: Self-pay | Admitting: Internal Medicine

## 2021-07-01 ENCOUNTER — Ambulatory Visit (INDEPENDENT_AMBULATORY_CARE_PROVIDER_SITE_OTHER): Payer: 59 | Admitting: Internal Medicine

## 2021-07-01 VITALS — BP 136/84 | HR 95 | Temp 98.2°F | Resp 16 | Ht 63.0 in | Wt 203.3 lb

## 2021-07-01 DIAGNOSIS — F419 Anxiety disorder, unspecified: Secondary | ICD-10-CM

## 2021-07-01 DIAGNOSIS — Z23 Encounter for immunization: Secondary | ICD-10-CM

## 2021-07-01 DIAGNOSIS — D649 Anemia, unspecified: Secondary | ICD-10-CM | POA: Diagnosis not present

## 2021-07-01 DIAGNOSIS — R262 Difficulty in walking, not elsewhere classified: Secondary | ICD-10-CM

## 2021-07-01 DIAGNOSIS — Z13220 Encounter for screening for lipoid disorders: Secondary | ICD-10-CM

## 2021-07-01 DIAGNOSIS — R200 Anesthesia of skin: Secondary | ICD-10-CM

## 2021-07-01 DIAGNOSIS — F4310 Post-traumatic stress disorder, unspecified: Secondary | ICD-10-CM | POA: Diagnosis not present

## 2021-07-01 DIAGNOSIS — M2550 Pain in unspecified joint: Secondary | ICD-10-CM

## 2021-07-01 DIAGNOSIS — R202 Paresthesia of skin: Secondary | ICD-10-CM

## 2021-07-01 DIAGNOSIS — Z1321 Encounter for screening for nutritional disorder: Secondary | ICD-10-CM

## 2021-07-01 NOTE — Progress Notes (Signed)
? ?New Patient Office Visit ? ?Subjective:  ?Patient ID: Dawn [name], female    DOB: 1940/06/03  Age: 81 y.o. MRN: [id number] ? ?CC:  ?Chief Complaint  ?Patient presents with  ? Follow-up  ? Depression  ? Fibromyalgia  ? ? ?HPI ?Dawn [name] presents for follow up. She was recently seen in the hospital for GI bleed, was seen here and then went back to the ER for same issue. Ended up waiting for 4 hours without being seen. States that since then, her abdominal pain has completely resolved. She denies nausea, vomiting, constipation/diarrhea, bloody stools or dark colored stools. Hemoglobin low from 06/03/21 11.6, lipase negative, LFTs mildly elevated with AST 44 and ALT 58, alk phos 225. She is on Protonix 40 mg daily and will see the GI next week.  ? ?Hypertension/CHF: ?-Medications: None, was on a beta blocker previously for about 1 year but ran out of it. No side effects. Had taken Lasix in the past but became really shaky and tremulous  ?-Does not check blood pressure at home  ?-Denies chest pain, shortness of breath, dizziness, leg swelling ? ?RA/Lupus/Fibromyalgia: ?-Reviewed labs from 2016, ANA negative, RF positive, inflammatory markers negative ?-Not seeing Rheumatology currently or on any treatment  ?-Her symptoms include arthralgias, symmetric - mainly affecting ankles, knees and hips. Also chronic fatigue and numbness in her feet.   ? ?MDD: ?-Mood status: better ?-Current treatment: Effexor XR 150 in the am and 75 XR in the pm ?-Satisfied with current treatment?: yes ?-Symptom severity: moderate ?-Duration of current treatment : years - had been on Effexor 225 for years, decreased in the hospital ?-Side effects: no ?Medication compliance: excellent compliance ?Psychotherapy/counseling: no in the past, interested in starting therapy again  ?Depressed mood: yes ?Anxious mood: yes ? ? ?  07/01/2021  ?  2:15 PM 06/03/2021  ?  9:20 AM  ?Depression screen PHQ 2/9  ?Decreased Interest 0 0  ?Down, Depressed,  Hopeless 0 2  ?PHQ - 2 Score 0 2  ?Altered sleeping 0 3  ?Tired, decreased energy 0 3  ?Change in appetite 0 0  ?Feeling bad or failure about yourself  0 1  ?Trouble concentrating 0 3  ?Moving slowly or fidgety/restless 0 0  ?Suicidal thoughts 0 0  ?PHQ-9 Score 0 12  ?Difficult doing work/chores Not difficult at all Somewhat difficult  ? ?Health Maintenance: ?-Blood work due ?-Mammogram/DEXA not interested in screening at this time ? ?Past Medical History:  ?Diagnosis Date  ? CHF (congestive heart failure) ([place])   ? Colitis, ischemic ([place])   ? Fibromyalgia   ? Hypertension   ? Leaky heart valve   ? Lupus ([place])   ? Osteoarthritis   ? PTSD (post-traumatic stress disorder)   ? Rheumatoid arthritis ([place])   ? ? ?Past Surgical History:  ?Procedure Laterality Date  ? TONSILLECTOMY    ? ? ?Family History  ?Problem Relation Age of Onset  ? Cancer Mother   ? Alzheimer's disease Mother   ? Cancer Father   ?     prostate  ? ADD / ADHD Son   ? ? ?Social History  ? ?Socioeconomic History  ? Marital status: Married  ?  Spouse name: Not on file  ? Number of children: Not on file  ? Years of education: Not on file  ? Highest education level: Not on file  ?Occupational History  ? Not on file  ?Tobacco Use  ? Smoking status: Never  ? Smokeless tobacco: Not  on file  ?Vaping Use  ? Vaping Use: Never used  ?Substance and Sexual Activity  ? Alcohol use: No  ? Drug use: Not Currently  ? Sexual activity: Not Currently  ?Other Topics Concern  ? Not on file  ?Social History Narrative  ? Not on file  ? ?Social Determinants of Health  ? ?Financial Resource Strain: Not on file  ?Food Insecurity: Not on file  ?Transportation Needs: Not on file  ?Physical Activity: Not on file  ?Stress: Not on file  ?Social Connections: Not on file  ?Intimate Partner Violence: Not on file  ? ? ?ROS ?Review of Systems  ?Constitutional:  Positive for fatigue. Negative for chills and fever.  ?Eyes:  Negative for visual disturbance.  ?Respiratory:  Negative for cough  and shortness of breath.   ?Cardiovascular:  Negative for chest pain, palpitations and leg swelling.  ?Gastrointestinal:  Negative for abdominal pain, blood in stool, constipation, diarrhea, nausea and vomiting.  ?Genitourinary:  Negative for dysuria and hematuria.  ?Musculoskeletal:  Positive for arthralgias.  ?Neurological:  Positive for numbness. Negative for dizziness and headaches.  ? ?Objective:  ? ?Today's Vitals: BP 136/84   Pulse 95   Temp 98.2 ?F (36.8 ?C)   Resp 16   Ht 5' 3"  (1.6 m)   Wt 203 lb 4.8 oz (92.2 kg)   SpO2 97%   BMI 36.01 kg/m?  ? ?Physical Exam ?Constitutional:   ?   Appearance: Normal appearance.  ?HENT:  ?   Head: Normocephalic and atraumatic.  ?Eyes:  ?   Conjunctiva/sclera: Conjunctivae normal.  ?Cardiovascular:  ?   Rate and Rhythm: Normal rate and regular rhythm.  ?   Pulses:     ?     Dorsalis pedis pulses are 2+ on the right side and 2+ on the left side.  ?Pulmonary:  ?   Effort: Pulmonary effort is normal.  ?   Breath sounds: Normal breath sounds.  ?Abdominal:  ?   General: There is no distension.  ?   Palpations: Abdomen is soft.  ?   Tenderness: There is no abdominal tenderness. There is no guarding or rebound.  ?   Comments: RUQ and LUQ pain to palpation   ?Musculoskeletal:  ?   Right lower leg: No edema.  ?   Left lower leg: No edema.  ?   Right foot: Normal range of motion. No deformity, bunion, Charcot foot, foot drop or prominent metatarsal heads.  ?   Left foot: Normal range of motion. No deformity, bunion, Charcot foot, foot drop or prominent metatarsal heads.  ?Feet:  ?   Right foot:  ?   Protective Sensation: 6 sites tested.  3 sites sensed.  ?   Skin integrity: Skin integrity normal.  ?   Toenail Condition: Right toenails are normal.  ?   Left foot:  ?   Protective Sensation: 6 sites tested.  2 sites sensed.  ?   Skin integrity: Skin integrity normal.  ?   Toenail Condition: Left toenails are normal.  ?Skin: ?   General: Skin is warm and dry.  ?Neurological:  ?    General: No focal deficit present.  ?   Mental Status: She is alert. Mental status is at baseline.  ?Psychiatric:     ?   Mood and Affect: Mood normal.     ?   Behavior: Behavior normal.  ? ? ?Assessment & Plan:  ? ?1. Pain in joint, multiple sites: Questionable diagnosis in the past  of autoimmune disease like RA, fibromyalgia? Unable to verify, still complaining of pain in multiple joints and now with numbness in both feet. Start autoimmune work up including ANA, inflammatory markers, RF. Follow up in 1 month for recheck.  ? ?- Antinuclear Antib (ANA) ?- C-reactive protein ?- Sed Rate (ESR) ?- Rheumatoid Factor ? ?2. Numbness and tingling of both feet: Decreased sensation on exam to monofilament, L>R. Labs as above with additional TSH, A1c, Vitamin B12, Vitamin D.  ? ?- B12 and Folate Panel ?- TSH ?- Vitamin D (25 hydroxy) ?- COMPLETE METABOLIC PANEL WITH GFR ?- HgB A1c ? ?3. Anemia, unspecified type: Low last month after GI bleed, recheck today. Abdominal symptoms have resolved, still on Protonix 40 mg daily. Planning to see GI next week.  ? ?- CBC w/Diff/Platelet ? ?4. PTSD (post-traumatic stress disorder)/Anxiety: Doing well with change of Effexor dose. Continue 150 mg in the morning and 75 mg at night. Referral placed to social work to help with mental help resources and counseling services.  ? ?- Vitamin D (25 hydroxy) ?- AMB Referral to [place] ? ?5. Encounter for vitamin deficiency screening ? ?- Vitamin D (25 hydroxy) ? ?6. Ambulatory dysfunction: Walks with a cane outside of her house but unsteady on her feet due to arthralgias, referral to home health for home aid and PT placed today.  ? ?- Ambulatory referral to Home Health ? ?7. Lipid screening: Lipid screening today, patient is fasting.  ?- Lipid Profile ? ?8. Vaccine for streptococcus pneumoniae and influenza: Pneumonia vaccine administered today.  ? ?- Pneumococcal conjugate vaccine 20-valent (Prevnar 20) ? ?Outpatient Encounter  Medications as of 07/01/2021  ?Medication Sig  ? acetaminophen (TYLENOL) 650 MG CR tablet Take 650 mg by mouth every 8 (eight) hours as needed for pain.  ? pantoprazole (PROTONIX) 40 MG tablet Take 1 tablet (40 mg

## 2021-07-01 NOTE — Patient Instructions (Addendum)
It was great seeing you today! ? ?Plan discussed at today's visit: ?-Blood work ordered today, results will be uploaded to MyChart.  ?-Monitor blood pressure at home, please check once a week and bring records to our next appointment  ?-Continue current medications ?-Follow up with GI  ?-Pneumonia vaccine today, can take Tylenol if you have symptoms  ?-Referral for home health aide placed and social worker  ? ?Follow up in: 1 month  ? ?Take care and let us know if you have any questions or concerns prior to your next visit. ? ?Dr. Caralee Ates ? ?

## 2021-07-02 ENCOUNTER — Telehealth: Payer: Self-pay | Admitting: *Deleted

## 2021-07-02 LAB — CBC WITH DIFFERENTIAL/PLATELET
Absolute Monocytes: 938 cells/uL (ref 200–950)
Basophils Absolute: 69 cells/uL (ref 0–200)
Basophils Relative: 0.5 %
Eosinophils Absolute: 124 cells/uL (ref 15–500)
Eosinophils Relative: 0.9 %
HCT: 42.4 % (ref 35.0–45.0)
Hemoglobin: 14.2 g/dL (ref 11.7–15.5)
Lymphs Abs: 2387 cells/uL (ref 850–3900)
MCH: 31.4 pg (ref 27.0–33.0)
MCHC: 33.5 g/dL (ref 32.0–36.0)
MCV: 93.8 fL (ref 80.0–100.0)
MPV: 10.9 fL (ref 7.5–12.5)
Monocytes Relative: 6.8 %
Neutro Abs: 10281 cells/uL — ABNORMAL HIGH (ref 1500–7800)
Neutrophils Relative %: 74.5 %
Platelets: 343 10*3/uL (ref 140–400)
RBC: 4.52 10*6/uL (ref 3.80–5.10)
RDW: 12.4 % (ref 11.0–15.0)
Total Lymphocyte: 17.3 %
WBC: 13.8 10*3/uL — ABNORMAL HIGH (ref 3.8–10.8)

## 2021-07-02 LAB — COMPLETE METABOLIC PANEL WITH GFR
AG Ratio: 1.9 (calc) (ref 1.0–2.5)
ALT: 32 U/L — ABNORMAL HIGH (ref 6–29)
AST: 24 U/L (ref 10–35)
Albumin: 4.5 g/dL (ref 3.6–5.1)
Alkaline phosphatase (APISO): 142 U/L (ref 37–153)
BUN/Creatinine Ratio: 19 (calc) (ref 6–22)
BUN: 19 mg/dL (ref 7–25)
CO2: 24 mmol/L (ref 20–32)
Calcium: 9.5 mg/dL (ref 8.6–10.4)
Chloride: 100 mmol/L (ref 98–110)
Creat: 0.99 mg/dL — ABNORMAL HIGH (ref 0.60–0.95)
Globulin: 2.4 g/dL (calc) (ref 1.9–3.7)
Glucose, Bld: 172 mg/dL — ABNORMAL HIGH (ref 65–99)
Potassium: 5.5 mmol/L — ABNORMAL HIGH (ref 3.5–5.3)
Sodium: 135 mmol/L (ref 135–146)
Total Bilirubin: 0.5 mg/dL (ref 0.2–1.2)
Total Protein: 6.9 g/dL (ref 6.1–8.1)
eGFR: 58 mL/min/{1.73_m2} — ABNORMAL LOW (ref >=60)

## 2021-07-02 LAB — RHEUMATOID FACTOR: Rheumatoid fact SerPl-aCnc: <14 IU/mL (ref <14)

## 2021-07-02 LAB — LIPID PANEL
Cholesterol: 249 mg/dL — ABNORMAL HIGH (ref <200)
HDL: 71 mg/dL (ref >=50)
LDL Cholesterol (Calc): 145 mg/dL (calc) — ABNORMAL HIGH
Non-HDL Cholesterol (Calc): 178 mg/dL (calc) — ABNORMAL HIGH (ref <130)
Total CHOL/HDL Ratio: 3.5 (calc) (ref <5.0)
Triglycerides: 193 mg/dL — ABNORMAL HIGH (ref <150)

## 2021-07-02 LAB — SEDIMENTATION RATE: Sed Rate: 11 mm/h (ref 0–30)

## 2021-07-02 LAB — HEMOGLOBIN A1C
Hgb A1c MFr Bld: 6.7 % of total Hgb — ABNORMAL HIGH (ref <5.7)
Mean Plasma Glucose: 146 mg/dL
eAG (mmol/L): 8.1 mmol/L

## 2021-07-02 LAB — VITAMIN D 25 HYDROXY (VIT D DEFICIENCY, FRACTURES): Vit D, 25-Hydroxy: 34 ng/mL (ref 30–100)

## 2021-07-02 LAB — TSH: TSH: 2.44 mIU/L (ref 0.40–4.50)

## 2021-07-02 LAB — B12 AND FOLATE PANEL
Folate: 14.3 ng/mL
Vitamin B-12: 356 pg/mL (ref 200–1100)

## 2021-07-02 LAB — C-REACTIVE PROTEIN: CRP: 2.9 mg/L (ref <8.0)

## 2021-07-02 LAB — ANA: Anti Nuclear Antibody (ANA): NEGATIVE

## 2021-07-02 NOTE — Chronic Care Management (AMB) (Signed)
?  Care Management  ? ?Note ? ?07/02/2021 ?Name: [name] MRN: [id number] DOB: August 29, 1940 ? ?Dawn [name] is a 81 y.o. year old female who is a primary care patient of Margarita Mail, DO. I reached out to Aundria M [name] by phone today offer care coordination services.  ? ?[name] was given information about care management services today including:  ?Care management services include personalized support from designated clinical staff supervised by her physician, including individualized plan of care and coordination with other care providers ?24/7 contact phone numbers for assistance for urgent and routine care needs. ?The patient may stop care management services at any time by phone call to the office staff. ? ?Patient agreed to services and verbal consent obtained.  ? ?Follow up plan: ?Telephone appointment with care management team member scheduled for: 07/05/2021 ? ?Billie Trager, CCMA ?Care Guide, Embedded Care Coordination ?  Care Management  ?Direct Dial: [phone] ? ? ?

## 2021-07-05 ENCOUNTER — Telehealth: Payer: Self-pay

## 2021-07-05 ENCOUNTER — Telehealth: Payer: 59

## 2021-07-05 NOTE — Telephone Encounter (Signed)
Copied from CRM [id number]. Topic: General - Call Back - No Documentation >> Jul 05, 2021  9:40 AM Randol Kern wrote: Reason for CRM: Pt called and is requesting to speak to "Kennyth Arnold" she wants to reschedule to next Friday at 2:00 pm please advise

## 2021-07-11 NOTE — Chronic Care Management (AMB) (Signed)
  Care Coordination Note  07/11/2021 Name: Dawn [name] MRN: [id number] DOB: 06-23-1940  Dawn [name] is a 81 y.o. year old female who is a primary care patient of Teodora Medici, DO and is actively engaged with the care management team. I reached out to Dawn [name] by phone today to assist with re-scheduling an initial visit with the Licensed Clinical Social Worker  Follow up plan: Telephone appointment with care management team member scheduled for: 07/22/2021  Julian Hy, [place], [place] Management  Direct Dial: [phone]

## 2021-07-22 ENCOUNTER — Ambulatory Visit: Payer: 59 | Admitting: *Deleted

## 2021-07-22 DIAGNOSIS — I10 Essential (primary) hypertension: Secondary | ICD-10-CM

## 2021-07-22 DIAGNOSIS — F419 Anxiety disorder, unspecified: Secondary | ICD-10-CM

## 2021-07-22 DIAGNOSIS — F4310 Post-traumatic stress disorder, unspecified: Secondary | ICD-10-CM

## 2021-07-22 DIAGNOSIS — M2550 Pain in unspecified joint: Secondary | ICD-10-CM

## 2021-07-22 NOTE — Chronic Care Management (AMB) (Addendum)
Care Management    Clinical Social Work Note  07/22/2021 Name: Dawn [name] MRN: [id number] DOB: 1940/06/20  Dawn [name] is a 81 y.o. year old female who is a primary care patient of Teodora Medici, DO. The CCM team was consulted to assist the patient with chronic disease management and/or care coordination needs related to: Mental Health Counseling and Resources.   Engaged with patient by telephone for initial visit in response to provider referral for social work chronic care management and care coordination services.   Consent to Services:  The patient was given the following information about Chronic Care Management services today, agreed to services, and gave verbal consent: 1. CCM service includes personalized support from designated clinical staff supervised by the primary care provider, including individualized plan of care and coordination with other care providers 2. 24/7 contact phone numbers for assistance for urgent and routine care needs. 3. Service will only be billed when office clinical staff spend 20 minutes or more in a month to coordinate care. 4. Only one practitioner may furnish and bill the service in a calendar month. 5.The patient may stop CCM services at any time (effective at the end of the month) by phone call to the office staff. 6. The patient will be responsible for cost sharing (co-pay) of up to 20% of the service fee (after annual deductible is met). Patient agreed to services and consent obtained.  Patient agreed to services and consent obtained.   Assessment: Review of patient past medical history, allergies, medications, and health status, including review of relevant consultants reports was performed today as part of a comprehensive evaluation and provision of chronic care management and care coordination services.     SDOH (Social Determinants of Health) assessments and interventions performed:  SDOH Interventions    Flowsheet Row Most Recent Value   SDOH Interventions   SDOH Interventions for the Following Domains Stress  Stress Interventions Provide Counseling  Depression Interventions/Treatment  Medication, Currently on Treatment        Advanced Directives Status: Not addressed in this encounter.  CCM Care Plan  Allergies  Allergen Reactions   Doxycycline Nausea And Vomiting   Nsaids Other (See Comments)    GI Bleed   Penicillin G Swelling   Sulfa Antibiotics Nausea And Vomiting    Outpatient Encounter Medications as of 07/22/2021  Medication Sig   acetaminophen (TYLENOL) 650 MG CR tablet Take 650 mg by mouth every 8 (eight) hours as needed for pain.   pantoprazole (PROTONIX) 40 MG tablet Take 1 tablet (40 mg total) by mouth 2 (two) times daily.   venlafaxine XR (EFFEXOR-XR) 150 MG 24 hr capsule Take 150 mg by mouth daily with breakfast.   venlafaxine XR (EFFEXOR-XR) 75 MG 24 hr capsule Take 1 capsule (75 mg total) by mouth daily with breakfast.   No facility-administered encounter medications on file as of 07/22/2021.    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   Patient confirmed difficulty breathing, difficulty organizing her thoughts, which leads to inability leaving her home at times Patient also requesting personal care services due to increased arthritis pain   Patient needs Support, Education, and Care Coordination in order to meet unmet mental health needs  ADL IADL limitations and Mental Health Concerns   Clinical Social Work Goal(s):  Over the next 90 days, patient will work with SW bi-weekly by telephone or in person to reduce or manage symptoms of anxiety until connected for ongoing counseling resources.  Patient will implement clinical interventions discussed today to decreases symptoms of anxiety and increase knowledge and/or ability of: coping skills. Over the next 90 days, patient will demonstrate improved adherence to prescribed treatment plan for post traumatic stress disorder as evidenced by active participation in therapy   Interventions:  Assessed patient's understanding, education, previous treatment and care coordination needs  Patient interviewed and appropriate assessments performed: PHQ 2 Provided basic mental health support, education and interventions to address symptoms of anxiety Reinforced the following coping mechanisms deep breathing exercises, diversion activities, and medication management Collaborated with appropriate clinical care team members regarding patient needs Discussed several options for long term counseling based on need and insurance and will make appropriate referrals Discussed plan to request completion of request form for personal care services to be completed by patient's provider for in home aid support Reviewed mental health medications with patient prescribed by PCP and discussed compliance  Other interventions  include: Motivational Interviewing employed Solution-Focused Strategies employed:  Active listening / Reflection utilized  Emotional Support Provided   Patient Self Care Activities & Deficits:  Patient is unable to independently navigate community resource options without care coordination support Patient is able to implement clinical interventions discussed today and is motivated for treatment  Patient will select one of the agencies from the list provided and call to schedule an appointment  Motivation for treatment  Initial goal documentation       Follow Up Plan: SW will follow up with patient by phone over the next 14 business days      [place], [place] Worker  [facility] Care Management [phone]

## 2021-07-22 NOTE — Patient Instructions (Signed)
Visit Information  Thank you for taking time to visit with me today. Please don't hesitate to contact me if I can be of assistance to you before our next scheduled telephone appointment.  Following are the goals we discussed today:  - begin personal counseling - learn and use visualization or guided imagery - practice relaxation or meditation daily - talk about feelings with a friend, family or spiritual advisor - practice positive thinking and self-talk  Our next appointment is by telephone on 08/05/21 at 9am  Please call the care guide team at [phone] if you need to cancel or reschedule your appointment.   If you are experiencing a Mental Health or Behavioral Health Crisis or need someone to talk to, please call the Suicide and Crisis Lifeline: 988   Following is a copy of your full plan of care:  Care Plan : General Social Work (Adult)  Updates made by [employer], Clent Jacks, LCSW since 07/22/2021 12:00 AM     Problem: CHL AMB "PATIENT-SPECIFIC PROBLEM"   Note:   CARE PLAN ENTRY (see longitudinal plan of care for additional care plan information)  Current Barriers:  Knowledge deficits related to accessing mental health provider in patient with Anxiety  Patient is experiencing symptoms of  Post Traumatic Stress Disorder which seem to be exacerbated by stress.   Patient confirmed difficulty breathing, difficulty organizing her thoughts, which leads to inability leaving her home at times Patient also requesting personal care services due to increased arthritis pain   Patient needs Support, Education, and Care Coordination in order to meet unmet mental health needs  ADL IADL limitations and Mental Health Concerns   Clinical Social Work Goal(s):  Over the next 90 days, patient will work with SW bi-weekly by telephone or in person to reduce or manage symptoms of anxiety until connected for ongoing counseling resources.  Patient will implement clinical interventions discussed today to  decreases symptoms of anxiety and increase knowledge and/or ability of: coping skills. Over the next 90 days, patient will demonstrate improved adherence to prescribed treatment plan for post traumatic stress disorder as evidenced by active participation in therapy   Interventions:  Assessed patient's understanding, education, previous treatment and care coordination needs  Patient interviewed and appropriate assessments performed: PHQ 2 Provided basic mental health support, education and interventions to address symptoms of anxiety Reinforced the following coping mechanisms deep breathing exercises, diversion activities, and medication management Collaborated with appropriate clinical care team members regarding patient needs Discussed several options for long term counseling based on need and insurance and will make appropriate referrals Discussed plan to request completion of request form for personal care services to be completed by patient's provider for in home aid support Reviewed mental health medications with patient prescribed by PCP and discussed compliance  Other interventions include: Motivational Interviewing employed Solution-Focused Strategies employed:  Active listening / Reflection utilized  Emotional Support Provided   Patient Self Care Activities & Deficits:  Patient is unable to independently navigate community resource options without care coordination support Patient is able to implement clinical interventions discussed today and is motivated for treatment  Patient will select one of the agencies from the list provided and call to schedule an appointment  Motivation for treatment  Initial goal documentation      Ms. [place] was given information about Care Management services by the embedded care coordination team including:  Care Management services include personalized support from designated clinical staff supervised by her physician, including individualized  plan of care  and coordination with other care providers 24/7 contact phone numbers for assistance for urgent and routine care needs. The patient may stop CCM services at any time (effective at the end of the month) by phone call to the office staff.  Patient agreed to services and verbal consent obtained.   Patient verbalizes understanding of instructions and care plan provided today and agrees to view in MyChart. Active MyChart status and patient understanding of how to access instructions and care plan via MyChart confirmed with patient.     Telephone follow up appointment with care management team member scheduled for: 08/05/21  Verna Czech, LCSW Clinical Social Worker  [facility] Care Management [phone]

## 2021-07-26 ENCOUNTER — Encounter: Payer: Self-pay | Admitting: Internal Medicine

## 2021-07-26 ENCOUNTER — Telehealth: Payer: Self-pay | Admitting: Internal Medicine

## 2021-07-26 ENCOUNTER — Ambulatory Visit: Payer: Self-pay

## 2021-07-26 DIAGNOSIS — F4310 Post-traumatic stress disorder, unspecified: Secondary | ICD-10-CM

## 2021-07-26 DIAGNOSIS — E1140 Type 2 diabetes mellitus with diabetic neuropathy, unspecified: Secondary | ICD-10-CM | POA: Insufficient documentation

## 2021-07-26 DIAGNOSIS — R262 Difficulty in walking, not elsewhere classified: Secondary | ICD-10-CM | POA: Insufficient documentation

## 2021-07-26 MED ORDER — VENLAFAXINE HCL ER 75 MG PO CP24: 75.0000 mg | ORAL_CAPSULE | Freq: Every day | ORAL | 0 refills | Status: DC

## 2021-07-26 MED ORDER — VENLAFAXINE HCL ER 150 MG PO CP24: 150.0000 mg | ORAL_CAPSULE | Freq: Every day | ORAL | 0 refills | Status: DC

## 2021-07-26 NOTE — Telephone Encounter (Signed)
Pt is taking venlafaxine XR (EFFEXOR-XR) 75 MG 24 hr capsule / but she was speaking with her insurance and the pharmacy and she would like to have the actual brand name EFFEXOR -XR instead / her insurance advised the brand was covered / pt asked if this can be called in today / she took her last Venlafaxine yesterday / please advise   [facility] [address], [place] - [address]  [address] [place] [address]  Phone:  [phone]  Fax:  [phone]

## 2021-07-26 NOTE — Telephone Encounter (Signed)
She wants brand and states spoke to insurance and they state will cover?

## 2021-07-26 NOTE — Telephone Encounter (Signed)
Called pt will discuss at appt

## 2021-07-26 NOTE — Telephone Encounter (Signed)
    Chief Complaint: Feels nervous, hands get shaky, not sleeping well. States PCP knows "I've been nervous." Symptoms: Nervous Frequency: 1 month ago Pertinent Negatives: Patient denies self-harm Disposition: [] ED /[] Urgent Care (no appt availability in office) / [] Appointment(In office/virtual)/ []  [facility]/ [] Home Care/ [] Refused Recommended Disposition /[] [facility]/ [x]  Follow-up with PCP Additional Notes: Asking for additional medication. Please advise.  Answer Assessment - Initial Assessment Questions 1. CONCERN: "Did anything happen that prompted you to call today?"      I have nerves 2. ANXIETY SYMPTOMS: "Can you describe how you (your loved one; patient) have been feeling?" (e.g., tense, restless, panicky, anxious, keyed up, overwhelmed, sense of impending doom).      I'm nervous 3. ONSET: "How long have you been feeling this way?" (e.g., hours, days, weeks)     1 month 4. SEVERITY: "How would you rate the level of anxiety?" (e.g., 0 - 10; or mild, moderate, severe).     Severe 5. FUNCTIONAL IMPAIRMENT: "How have these feelings affected your ability to do daily activities?" "Have you had more difficulty than usual doing your normal daily activities?" (e.g., getting better, same, worse; self-care, school, work, interactions)     Not sleeping well 6. HISTORY: "Have you felt this way before?" "Have you ever been diagnosed with an anxiety problem in the past?" (e.g., generalized anxiety disorder, panic attacks, PTSD). If Yes, ask: "How was this problem treated?" (e.g., medicines, counseling, etc.)     Yes 7. RISK OF HARM - SUICIDAL IDEATION: "Do you ever have thoughts of hurting or killing yourself?" If Yes, ask:  "Do you have these feelings now?" "Do you have a plan on how you would do this?"     No 8. TREATMENT:  "What has been done so far to treat this anxiety?" (e.g., medicines, relaxation strategies). "What has helped?"     No 9. TREATMENT - THERAPIST:  "Do you have a counselor or therapist? Name?"     No 10. POTENTIAL TRIGGERS: "Do you drink caffeinated beverages (e.g., coffee, colas, teas), and how much daily?" "Do you drink alcohol or use any drugs?" "Have you started any new medicines recently?"     No 10. PATIENT SUPPORT: "Who is with you now?" "Who do you live with?" "Do you have family or friends who you can talk to?"        Family 11. OTHER SYMPTOMS: "Do you have any other symptoms?" (e.g., feeling depressed, trouble concentrating, trouble sleeping, trouble breathing, palpitations or fast heartbeat, chest pain, sweating, nausea, or diarrhea)       Hands shake 12. PREGNANCY: "Is there any chance you are pregnant?" "When was your last menstrual period?"       No  Protocols used: Anxiety and Panic Attack-A-AH

## 2021-07-29 ENCOUNTER — Ambulatory Visit: Payer: Self-pay | Admitting: *Deleted

## 2021-07-29 DIAGNOSIS — I10 Essential (primary) hypertension: Secondary | ICD-10-CM

## 2021-07-29 DIAGNOSIS — F419 Anxiety disorder, unspecified: Secondary | ICD-10-CM

## 2021-07-29 DIAGNOSIS — F4310 Post-traumatic stress disorder, unspecified: Secondary | ICD-10-CM

## 2021-07-30 NOTE — Patient Instructions (Signed)
Visit Information  Thank you for taking time to visit with me today. Please don't hesitate to contact me if I can be of assistance to you before our next scheduled telephone appointment.  Following are the goals we discussed today:  - begin personal counseling-referral sent to [facility] - learn and use visualization or guided imagery - practice relaxation or meditation daily - talk about feelings with a friend, family or spiritual advisor - practice positive thinking and self-talk  Our next appointment is by telephone on 08/05/21 at 9am  Please call the care guide team at [phone] if you need to cancel or reschedule your appointment.   If you are experiencing a Mental Health or Behavioral Health Crisis or need someone to talk to, please call the Suicide and Crisis Lifeline: 988   Patient verbalizes understanding of instructions and care plan provided today and agrees to view in MyChart. Active MyChart status and patient understanding of how to access instructions and care plan via MyChart confirmed with patient.     Telephone follow up appointment with care management team member scheduled for:08/05/21  Verna Czech, LCSW Clinical Social Worker  [facility] Care [facility] [phone]

## 2021-07-30 NOTE — Chronic Care Management (AMB) (Addendum)
Care Management    Clinical Social Work Note  07/30/2021 Name: Dawn [name] MRN: [id number] DOB: September 08, 1940  Dawn [name] is a 81 y.o. year old female who is a primary care patient of Margarita Mail, DO. The CCM team was consulted to assist the patient with chronic disease management and/or care coordination needs related to: Mental Health Counseling and Resources.   Engaged with patient by telephone for follow up visit in response to provider referral for social work chronic care management and care coordination services.   Consent to Services:  The patient was given information about Chronic Care Management services, agreed to services, and gave verbal consent prior to initiation of services.  Please see initial visit note for detailed documentation.   Patient agreed to services and consent obtained.   Assessment: Review of patient past medical history, allergies, medications, and health status, including review of relevant consultants reports was performed today as part of a comprehensive evaluation and provision of chronic care management and care coordination services.     SDOH (Social Determinants of Health) assessments and interventions performed:    Advanced Directives Status: Not addressed in this encounter.  CCM Care Plan  Allergies  Allergen Reactions   Doxycycline Nausea And Vomiting   Nsaids Other (See Comments)    GI Bleed   Penicillin G Swelling   Sulfa Antibiotics Nausea And Vomiting    Outpatient Encounter Medications as of 07/29/2021  Medication Sig   acetaminophen (TYLENOL) 650 MG CR tablet Take 650 mg by mouth every 8 (eight) hours as needed for pain.   pantoprazole (PROTONIX) 40 MG tablet Take 1 tablet (40 mg total) by mouth 2 (two) times daily.   venlafaxine XR (EFFEXOR XR) 150 MG 24 hr capsule Take 1 capsule (150 mg total) by mouth daily with breakfast. Brand name please per patient's wishes.   venlafaxine XR (EFFEXOR XR) 75 MG 24 hr capsule Take 1  capsule (75 mg total) by mouth daily with breakfast. Brand name please per patient's wishes.   No facility-administered encounter medications on file as of 07/29/2021.    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   Patient confirmed difficulty breathing, difficulty organizing her thoughts, which leads to inability leaving her home at times Patient also requesting personal care services due to increased arthritis pain   Patient needs Support, Education, and Care Coordination in order to  meet unmet mental health needs  ADL IADL limitations and Mental Health Concerns   Clinical Social Work Goal(s):  Over the next 90 days, patient will work with SW bi-weekly by telephone or in person to reduce or manage symptoms of anxiety until connected for ongoing counseling  resources.  Patient will implement clinical interventions discussed today to decreases symptoms of anxiety and increase knowledge and/or ability of: coping skills. Over the next 90 days, patient will demonstrate improved adherence to prescribed treatment plan for post traumatic stress disorder as evidenced by active participation in therapy   Interventions:  Continued to assess patient's understanding, education, previous treatment and care coordination needs  Collaborated with appropriate clinical care team members regarding patient needs Discussed recommendation for long term counseling based on need and insurance to manage anxiety-discussed referral for [employer] [facility] Confirmed that patient was a previous patient of [facility] and received personal care services at that time but has since discontinued-patient requesting in home care services to resume Confirmed that completed personal care services request form was received, however additional information needed(practice stamp) before request form will be practiced-form routed back to provider's office  Other interventions include: Solution-Focused Strategies employed:  Active listening / Reflection utilized  Emotional Support Provided   Patient Self Care Activities & Deficits:  Patient is unable to independently navigate community resource options without care coordination support Patient is able to implement clinical interventions discussed today and is motivated for treatment  Patient will select one of the agencies from the list provided and call to schedule an appointment  Motivation for treatment  Initial goal documentation       Follow Up Plan: SW will follow up with patient by phone over the next 14 business days      Dawn [name], [place] Clinical Social Worker  [facility] [phone]

## 2021-08-01 ENCOUNTER — Ambulatory Visit: Payer: Self-pay

## 2021-08-01 ENCOUNTER — Ambulatory Visit: Payer: 59 | Admitting: Internal Medicine

## 2021-08-01 NOTE — Telephone Encounter (Signed)
2nd attempt, called pt, LVMTCB to discuss panic attack and see how pt is doing and talk about the medication she is requesting.

## 2021-08-01 NOTE — Telephone Encounter (Signed)
Patient called, left VM to return the call to the office to discuss symptoms with a nurse.  Summary: panick attack   Pt called in to reschedule her appt due to her having a panick attack. Pt says that she feels tired after having it. Advised pt that I would like for her to speak with a nurse further. Pt says that she is okay to have a call back. Pt says that she would like to have a medication Gabapentin sent in to her pharmacy. Pt says that this usually help her when she have panick attacks.

## 2021-08-01 NOTE — Telephone Encounter (Signed)
3rd attempt. Pt called, LVMTCB. Will route to office per protcol.

## 2021-08-01 NOTE — Telephone Encounter (Signed)
Called patient she states feeling much better and we will see her at her 6/30 appt

## 2021-08-05 ENCOUNTER — Ambulatory Visit: Payer: Medicare Other | Admitting: *Deleted

## 2021-08-05 DIAGNOSIS — I10 Essential (primary) hypertension: Secondary | ICD-10-CM

## 2021-08-05 DIAGNOSIS — F4310 Post-traumatic stress disorder, unspecified: Secondary | ICD-10-CM

## 2021-08-05 DIAGNOSIS — F419 Anxiety disorder, unspecified: Secondary | ICD-10-CM

## 2021-08-05 NOTE — Chronic Care Management (AMB) (Addendum)
Care Management    Clinical Social Work Note  08/05/2021 Name: Lexys M [name] MRN: [id number] DOB: [birth year]  Dawn [name] is a 81 y.o. year old female who is a primary care patient of Margarita Mail, DO. The CCM team was consulted to assist the patient with chronic disease management and/or care coordination needs related to: Mental Health Counseling and Resources.   Engaged with patient by telephone for follow up visit in response to provider referral for social work chronic care management and care coordination services.   Consent to Services:  The patient was given information about Chronic Care Management services, agreed to services, and gave verbal consent prior to initiation of services.  Please see initial visit note for detailed documentation.   Patient agreed to services and consent obtained.   Assessment: Review of patient past medical history, allergies, medications, and health status, including review of relevant consultants reports was performed today as part of a comprehensive evaluation and provision of chronic care management and care coordination services.     SDOH (Social Determinants of Health) assessments and interventions performed:    Advanced Directives Status: Not addressed in this encounter.  CCM Care Plan  Allergies  Allergen Reactions   Doxycycline Nausea And Vomiting   Nsaids Other (See Comments)    GI Bleed   Penicillin G Swelling   Sulfa Antibiotics Nausea And Vomiting    Outpatient Encounter Medications as of 08/05/2021  Medication Sig   acetaminophen (TYLENOL) 650 MG CR tablet Take 650 mg by mouth every 8 (eight) hours as needed for pain.   pantoprazole (PROTONIX) 40 MG tablet Take 1 tablet (40 mg total) by mouth 2 (two) times daily.   venlafaxine XR (EFFEXOR XR) 150 MG 24 hr capsule Take 1 capsule (150 mg total) by mouth daily with breakfast. Brand name please per patient's wishes.   venlafaxine XR (EFFEXOR XR) 75 MG 24 hr capsule Take 1  capsule (75 mg total) by mouth daily with breakfast. Brand name please per patient's wishes.   No facility-administered encounter medications on file as of 08/05/2021.    Patient Active Problem List   Diagnosis Date Noted   Type 2 diabetes mellitus with diabetic neuropathy, unspecified (HCC) 07/26/2021   Ambulatory dysfunction 07/26/2021   Fibromyalgia 06/03/2021   Melena 06/01/2021   Hypertension    History of ischemic colitis 2013    Hyperglycemia    Generalized anxiety disorder 04/16/2017   Major depressive disorder 04/16/2017   Panic disorder 04/16/2017   PTSD (post-traumatic stress disorder) 04/16/2017   DDD (degenerative disc disease), lumbar 07/17/2014   Lumbar facet arthropathy 07/17/2014   Lumbar radicular pain 07/17/2014   Sacroiliac joint dysfunction 07/17/2014    Conditions to be addressed/monitored: Anxiety; Mental Health Concerns   Care Plan : General Social Work (Adult)  Updates made by Wenda Overland, LCSW since 08/05/2021 12:00 AM     Problem: CHL AMB "PATIENT-SPECIFIC PROBLEM"   Note:   CARE PLAN ENTRY (see longitudinal plan of care for additional care plan information)  Current Barriers:  Knowledge deficits related to accessing mental health provider in patient with Anxiety  Patient is experiencing symptoms of  Post Traumatic Stress Disorder which seem to be exacerbated by stress.   Patient confirmed difficulty breathing, difficulty organizing her thoughts, which leads to inability leaving her home at times Patient also requesting personal care services due to increased arthritis pain   Patient needs Support, Education, and Care Coordination in order to meet unmet mental health needs  ADL IADL limitations and Mental Health Concerns   Clinical Social Work Goal(s):  Over the next 90 days, patient will work with SW bi-weekly by telephone or in person to reduce or manage symptoms of anxiety until connected for ongoing counseling resources.  Patient will  implement clinical interventions discussed today to decreases symptoms of anxiety and increase knowledge and/or ability of: coping skills. Over the next 90 days, patient will demonstrate improved adherence to prescribed treatment plan for post traumatic stress disorder as evidenced by active participation in therapy   Interventions:  Continued to assess patient's understanding, education, previous treatment and care coordination needs  Collaborated with appropriate clinical care team members regarding patient needs Confirmed continued episodes of panic attacks with no known cause Discussed recommendation for long term counseling based on need and insurance to manage anxiety-followed up on referral for [facility]-patient confirms receiving a call from [facility] but lost the number-contact information provided to call to schedule the initial appointment Confirmed that completed personal care services request form was received-practice stamp corrected, however additional information needed-phone call to the provider's office-provider out this week, will call provider Monday morning to clarify form for personal care services Other interventions include: Solution-Focused Strategies employed:  Active listening / Reflection utilized  Emotional Support Provided   Patient Self Care Activities & Deficits:  Patient is unable to independently navigate community resource options without care coordination support Patient is able to implement clinical interventions discussed today and is motivated for treatment  Patient will select one of the agencies from the list provided and call to schedule an appointment  Motivation for treatment  Please see past updates related to this goal by clicking on the "Past Updates" button in the selected goal        Follow Up Plan: Appointment scheduled for SW follow up with client by phone on: 08/12/21      Verna Czech, LCSW Clinical  Social Worker  [facility] [phone]

## 2021-08-05 NOTE — Patient Instructions (Signed)
Visit Information  Thank you for taking time to visit with me today. Please don't hesitate to contact me if I can be of assistance to you before our next scheduled telephone appointment.  Following are the goals we discussed today:  - begin personal counseling - learn and use visualization or guided imagery - practice relaxation or meditation daily - talk about feelings with a friend, family or spiritual advisor - practice positive thinking and self-talk Our next appointment is by telephone on 08/16/21 at 9am  Please call the care guide team at [phone] if you need to cancel or reschedule your appointment.   If you are experiencing a Mental Health or Behavioral Health Crisis or need someone to talk to, please call the Suicide and Crisis Lifeline: 988   Patient verbalizes understanding of instructions and care plan provided today and agrees to view in MyChart. Active MyChart status and patient understanding of how to access instructions and care plan via MyChart confirmed with patient.     Telephone follow up appointment with care management team member scheduled for: 08/16/21  Verna Czech, LCSW Clinical Social Worker  [facility] [phone]

## 2021-08-09 ENCOUNTER — Encounter: Payer: Self-pay | Admitting: *Deleted

## 2021-08-09 ENCOUNTER — Ambulatory Visit: Payer: Self-pay | Admitting: *Deleted

## 2021-08-09 ENCOUNTER — Ambulatory Visit: Payer: Self-pay

## 2021-08-09 DIAGNOSIS — F419 Anxiety disorder, unspecified: Secondary | ICD-10-CM

## 2021-08-09 DIAGNOSIS — I10 Essential (primary) hypertension: Secondary | ICD-10-CM

## 2021-08-09 DIAGNOSIS — F4310 Post-traumatic stress disorder, unspecified: Secondary | ICD-10-CM

## 2021-08-09 NOTE — Telephone Encounter (Signed)
  Chief Complaint: needing advice Symptoms: PTSD, anxiety Frequency:  Pertinent Negatives: NA Disposition: [] ED /[] Urgent Care (no appt availability in office) / [] Appointment(In office/virtual)/ []  [place] [facility]/ [] Home Care/ [] Refused Recommended Disposition /[] [facility]/ [x]  Follow-up with PCP Additional Notes: pt called in, she is needing help d/t unable to pay rent and feels like she is going to be evicted. Pt had daughter in law go get a money order earlier this week but d/t her forgetting the PIN she was unable so now pt cant get help from her son until Monday. Pt has no where else to live because her son wont let her come live with him if she gets evicted. Pt is asking what to do. I attempted to reach out to Chrystal, SW but no answer at the number in chart for her. Called the office and spoke with Efraim Kaufmann, [employer] who sent her a message and didn't get a response so advised pt I will send message back HP and see if we can get her to fu with pt. Pt asked if practice would be able to call Romeo Apple [phone]) and see if she can postpone until Monday when her son will go to bank for her.   Answer Assessment - Initial Assessment Questions 1. CONCERN: "Did anything happen that prompted you to call today?"      Unable to pay rent and going to get evicted  2. ANXIETY SYMPTOMS: "Can you describe how you (your loved one; patient) have been feeling?" (e.g., tense, restless, panicky, anxious, keyed up, overwhelmed, sense of impending doom).      PTSD, anxious  3. ONSET: "How long have you been feeling this way?" (e.g., hours, days, weeks)     Had since early 20s 5. FUNCTIONAL IMPAIRMENT: "How have these feelings affected your ability to do daily activities?" "Have you had more difficulty than usual doing your normal daily activities?" (e.g., getting better, same, worse; self-care, school, work, interactions)      6. HISTORY: "Have you felt this way before?" "Have you ever been  diagnosed with an anxiety problem in the past?" (e.g., generalized anxiety disorder, panic attacks, PTSD). If Yes, ask: "How was this problem treated?" (e.g., medicines, counseling, etc.)     Yes and getting assistance or talking to someone  10. PATIENT SUPPORT: "Who is with you now?" "Who do you live with?" "Do you have family or friends who you can talk to?"        Her son and daughter in law (who doesn't care for her much)  Protocols used: Anxiety and Panic Attack-A-AH

## 2021-08-09 NOTE — Chronic Care Management (AMB) (Addendum)
Care Management    Clinical Social Work Note  08/12/2021 Name: Dawn [name] MRN: [id number] DOB: 1940-09-16  Dawn [name] is a 81 y.o. year old female who is a primary care patient of Margarita Mail, DO. The CCM team was consulted to assist the patient with chronic disease management and/or care coordination needs related to: Mental Health Counseling and Resources.   Engaged with patient by telephone for follow up visit in response to provider referral for social work chronic care management and care coordination services.   Consent to Services:  The patient was given information about Chronic Care Management services, agreed to services, and gave verbal consent prior to initiation of services.  Please see initial visit note for detailed documentation.   Patient agreed to services and consent obtained.   Assessment: Review of patient past medical history, allergies, medications, and health status, including review of relevant consultants reports was performed today as part of a comprehensive evaluation and provision of chronic care management and care coordination services.     SDOH (Social Determinants of Health) assessments and interventions performed:    Advanced Directives Status: Not addressed in this encounter.  CCM Care Plan  Allergies  Allergen Reactions   Doxycycline Nausea And Vomiting   Nsaids Other (See Comments)    GI Bleed   Penicillin G Swelling   Sulfa Antibiotics Nausea And Vomiting    Outpatient Encounter Medications as of 08/09/2021  Medication Sig   acetaminophen (TYLENOL) 650 MG CR tablet Take 650 mg by mouth every 8 (eight) hours as needed for pain.   pantoprazole (PROTONIX) 40 MG tablet Take 1 tablet (40 mg total) by mouth 2 (two) times daily.   venlafaxine XR (EFFEXOR XR) 150 MG 24 hr capsule Take 1 capsule (150 mg total) by mouth daily with breakfast. Brand name please per patient's wishes.   venlafaxine XR (EFFEXOR XR) 75 MG 24 hr capsule Take 1  capsule (75 mg total) by mouth daily with breakfast. Brand name please per patient's wishes.   No facility-administered encounter medications on file as of 08/09/2021.    Patient Active Problem List   Diagnosis Date Noted   Type 2 diabetes mellitus with diabetic neuropathy, unspecified (HCC) 07/26/2021   Ambulatory dysfunction 07/26/2021   Fibromyalgia 06/03/2021   Melena 06/01/2021   Hypertension    History of ischemic colitis 2013    Hyperglycemia    Generalized anxiety disorder 04/16/2017   Major depressive disorder 04/16/2017   Panic disorder 04/16/2017   PTSD (post-traumatic stress disorder) 04/16/2017   DDD (degenerative disc disease), lumbar 07/17/2014   Lumbar facet arthropathy 07/17/2014   Lumbar radicular pain 07/17/2014   Sacroiliac joint dysfunction 07/17/2014    Conditions to be addressed/monitored: Anxiety; Mental Health Concerns   Care Plan : General Social Work (Adult)  Updates made by Wenda Overland, LCSW since 08/12/2021 12:00 AM     Problem: CHL AMB "PATIENT-SPECIFIC PROBLEM"   Note:   CARE PLAN ENTRY (see longitudinal plan of care for additional care plan information)  Current Barriers:  Knowledge deficits related to accessing mental health provider in patient with Anxiety  Patient is experiencing symptoms of  Post Traumatic Stress Disorder which seem to be exacerbated by stress   Patient confirmed difficulty breathing, difficulty organizing her thoughts, which leads to inability leaving her home at times Patient also requesting personal care services due to increased arthritis pain Transportation to obtain money to pay rent   Patient needs Support, Education, and Care Coordination  in order to meet unmet mental health needs  ADL IADL limitations and Mental Health Concerns   Clinical Social Work Goal(s):  Over the next 90 days, patient will work with SW bi-weekly by telephone or in person to reduce or manage symptoms of anxiety until connected for  ongoing counseling resources.  Patient will implement clinical interventions discussed today to decreases symptoms of anxiety and increase knowledge and/or ability of: coping skills. Over the next 90 days, patient will demonstrate improved adherence to prescribed treatment plan for post traumatic stress disorder as evidenced by active participation in therapy   Interventions:  Continued to assess patient's understanding, education, previous treatment and care coordination needs  Message received from patient's providers office stating that patient would get evicted if rent was not paid Patient contacted who confirmed that she had the money for her rent since 07/20/21 but due to illness was not able to go out to get the money order for payment-rent was confirmed to be due on 07/18/21 Emotional support provided, Solution-Focused Strategies employed-explored all possible options regarding obtaining the money order needed to pay her rent that is past due and  Confirmed that her son or daughter in law could possibly assist, patient encouraged to call them today so that the rent could be paid by Monday-Patient did contact son who will take patient tomorrow afternoon to get the money order for rent Landlord contacted to advocate on patient's behalf, however the voicemail was full and could not leave a message Re-visited recommendation for long term counseling based on need and insurance to manage anxiety-followed up on referral for [facility]-patient confirms receiving a call from [facility] but lost the number-contact information provided to call to schedule the initial appointment Confirmed that completed personal care services request form was received-practice stamp corrected, however additional information needed-phone call to the provider's office-provider out this week, will call provider Monday morning to clarify form for personal care services Other interventions include:  Solution-Focused Strategies employed:  Active listening / Reflection utilized  Emotional Support Provided   Patient Self Care Activities & Deficits:  Patient is unable to independently navigate community resource options without care coordination support Patient is able to implement clinical interventions discussed today and is motivated for treatment  Patient will select one of the agencies from the list provided and call to schedule an appointment  Motivation for treatment  Please see past updates related to this goal by clicking on the "Past Updates" button in the selected goal        Follow Up Plan: SW will follow up with patient by phone over the next 14 business days      [employer], LCSW Clinical Social Worker  [facility] [phone]

## 2021-08-13 ENCOUNTER — Telehealth: Payer: Self-pay | Admitting: *Deleted

## 2021-08-15 ENCOUNTER — Encounter: Payer: Self-pay | Admitting: *Deleted

## 2021-08-15 ENCOUNTER — Ambulatory Visit: Payer: Self-pay | Admitting: *Deleted

## 2021-08-15 DIAGNOSIS — F419 Anxiety disorder, unspecified: Secondary | ICD-10-CM

## 2021-08-15 DIAGNOSIS — I10 Essential (primary) hypertension: Secondary | ICD-10-CM

## 2021-08-15 DIAGNOSIS — F4310 Post-traumatic stress disorder, unspecified: Secondary | ICD-10-CM

## 2021-08-15 NOTE — Chronic Care Management (AMB) (Addendum)
Care Management    Clinical Social Work Note  08/15/2021 Name: Dawn [name] MRN: [id number] DOB: December 16, 1940  Dawn [name] is a 81 y.o. year old female who is a primary care patient of Margarita Mail, DO. The CCM team was consulted to assist the patient with chronic disease management and/or care coordination needs related to: [employer]  and Mental Health Counseling and Resources.   Engaged with patient by telephone for follow up visit in response to provider referral for social work chronic care management and care coordination services.   Consent to Services:  The patient was given information about Chronic Care Management services, agreed to services, and gave verbal consent prior to initiation of services.  Please see initial visit note for detailed documentation.   Patient agreed to services and consent obtained.   Assessment: Review of patient past medical history, allergies, medications, and health status, including review of relevant consultants reports was performed today as part of a comprehensive evaluation and provision of chronic care management and care coordination services.     SDOH (Social Determinants of Health) assessments and interventions performed:    Advanced Directives Status: Not addressed in this encounter.  CCM Care Plan  Allergies  Allergen Reactions   Doxycycline Nausea And Vomiting   Nsaids Other (See Comments)    GI Bleed   Penicillin G Swelling   Sulfa Antibiotics Nausea And Vomiting    Outpatient Encounter Medications as of 08/15/2021  Medication Sig   acetaminophen (TYLENOL) 650 MG CR tablet Take 650 mg by mouth every 8 (eight) hours as needed for pain.   pantoprazole (PROTONIX) 40 MG tablet Take 1 tablet (40 mg total) by mouth 2 (two) times daily.   venlafaxine XR (EFFEXOR XR) 150 MG 24 hr capsule Take 1 capsule (150 mg total) by mouth daily with breakfast. Brand name please per patient's wishes.   venlafaxine XR (EFFEXOR XR) 75  MG 24 hr capsule Take 1 capsule (75 mg total) by mouth daily with breakfast. Brand name please per patient's wishes.   No facility-administered encounter medications on file as of 08/15/2021.    Patient Active Problem List   Diagnosis Date Noted   Type 2 diabetes mellitus with diabetic neuropathy, unspecified (HCC) 07/26/2021   Ambulatory dysfunction 07/26/2021   Fibromyalgia 06/03/2021   Melena 06/01/2021   Hypertension    History of ischemic colitis 2013    Hyperglycemia    Generalized anxiety disorder 04/16/2017   Major depressive disorder 04/16/2017   Panic disorder 04/16/2017   PTSD (post-traumatic stress disorder) 04/16/2017   DDD (degenerative disc disease), lumbar 07/17/2014   Lumbar facet arthropathy 07/17/2014   Lumbar radicular pain 07/17/2014   Sacroiliac joint dysfunction 07/17/2014    Conditions to be addressed/monitored: Anxiety; Mental Health Concerns   Care Plan : General Social Work (Adult)  Updates made by Wenda Overland, LCSW since 08/15/2021 12:00 AM     Problem: CHL AMB "PATIENT-SPECIFIC PROBLEM"   Note:   CARE PLAN ENTRY (see longitudinal plan of care for additional care plan information)  Current Barriers:  Knowledge deficits related to accessing mental health provider in patient with Anxiety  Patient is experiencing symptoms of  Post Traumatic Stress Disorder which seem to be exacerbated by stress   Patient confirmed difficulty breathing, difficulty organizing her thoughts, which leads to inability leaving her home at times Patient also requesting personal care services due to increased arthritis pain Transportation to obtain money to pay rent   Patient needs Support, Education,  and Care Coordination in order to meet unmet mental health needs  ADL IADL limitations and Mental Health Concerns   Clinical Social Work Goal(s):  Over the next 90 days, patient will work with SW bi-weekly by telephone or in person to reduce or manage symptoms of anxiety  until connected for ongoing counseling resources.  Patient will implement clinical interventions discussed today to decreases symptoms of anxiety and increase knowledge and/or ability of: coping skills. Over the next 90 days, patient will demonstrate improved adherence to prescribed treatment plan for post traumatic stress disorder as evidenced by active participation in therapy   Interventions:   Continued to assess patient's understanding, education, previous treatment and care coordination needs  Follow up on status of patient's rent, patient confirms that the was able to get her rent paid, however she is 2 months behind and has received a 30 day notice to move It is unclear that if she pays her unpaid rent, that the eviction will be reversed Patient advised to discuss this with the housing authority on 08/16/21 as well as request a hearing to appeal eviction Patient further advised to contact Legal Aid for any possible assistance [phone] Confirmed that she does receive assistance from her insurance plan $309.00 per month Emotional support provided, Solution-Focused Strategies employed Landlord contacted to advocate on patient's behalf, however voicemail left requesting a return call Other interventions include: Solution-Focused Strategies employed:  Active listening / Reflection utilized  Emotional Support Provided   Patient Self Care Activities & Deficits:  Patient is unable to independently navigate community resource options without care coordination support Patient is able to implement clinical interventions discussed today and is motivated for treatment  Patient will select one of the agencies from the list provided and call to schedule an appointment  Motivation for treatment  Please see past updates related to this goal by clicking on the "Past Updates" button in the selected goal        Follow Up Plan: SW will follow up with patient by phone over the next 14 business  days      [employer], LCSW Clinical Social Worker  [facility] [phone]

## 2021-08-15 NOTE — Patient Instructions (Signed)
Visit Information  Thank you for taking time to visit with me today. Please don't hesitate to contact me if I can be of assistance to you before our next scheduled telephone appointment.  Following are the goals we discussed today:   - begin personal counseling - learn and use visualization or guided imagery - practice relaxation or meditation daily - talk about feelings with a friend, family or spiritual advisor - practice positive thinking and self-talk  -contact legal aid to assist with housing [phone] -contact [employer] to confirm balance due for rent  Our next appointment is by telephone on 08/16/21 at 9am  Please call the care guide team at [phone] if you need to cancel or reschedule your appointment.   If you are experiencing a Mental Health or Behavioral Health Crisis or need someone to talk to, please call the Suicide and Crisis Lifeline: 988   Patient verbalizes understanding of instructions and care plan provided today and agrees to view in MyChart. Active MyChart status and patient understanding of how to access instructions and care plan via MyChart confirmed with patient.     Telephone follow up appointment with care management team member scheduled for: 08/16/21  Verna Czech, LCSW Clinical Social Worker  [facility] Care Management [phone]

## 2021-08-15 NOTE — Progress Notes (Deleted)
New Patient Office Visit  Subjective:  Patient ID: Dawn [name], female    DOB: [birth year]  Age: 81 y.o. MRN: [id number]  CC:  No chief complaint on file.   HPI Dawn [name] presents for follow up. She was recently seen in the hospital for GI bleed, was seen here and then went back to the ER for same issue. Ended up waiting for 4 hours without being seen. States that since then, her abdominal pain has completely resolved. She denies nausea, vomiting, constipation/diarrhea, bloody stools or dark colored stools. Hemoglobin low from 06/03/21 11.6, lipase negative, LFTs mildly elevated with AST 44 and ALT 58, alk phos 225. She is on Protonix 40 mg daily and will see the GI next week.   Hypertension/CHF: -Medications: None, was on a beta blocker previously for about 1 year but ran out of it. No side effects. 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 Also chronic fatigue and numbness in her feet.   -Decreased sensation with monofilament L>R -TSH, vitamin B12/folate negative, A1c showing newly diabetic   Diabetes, Type 2: -Last A1c 6.7% 6/23 -Medications: Nothing yet  -Checking BG at home: *** -Fasting home BG: *** -Post-prandial home BG: *** -Highest home BG since last visit: *** -Lowest home BG since last visit: *** -Diet: *** -Exercise: *** -Eye exam: *** -Foot exam: Decreased sensation 6/23 -Microalbumin: *** -Statin: *** -PNA vaccine: *** -Denies symptoms of hypoglycemia, polyuria, polydipsia, numbness extremities, foot ulcers/trauma. ***  MDD: -Mood status: better -Current treatment: Effexor XR 150 in the am and 75 XR in the pm -Satisfied with current  treatment?: yes -Symptom severity: moderate -Duration of current treatment : years - had been on Effexor 225 for years, decreased in the hospital -Side effects: no Medication compliance: excellent compliance Psychotherapy/counseling: no in the past, interested in starting therapy again  Depressed mood: yes Anxious mood: yes     07/22/2021    9:21 AM 07/01/2021    2:15 PM 06/03/2021    9:20 AM  Depression screen PHQ 2/9  Decreased Interest 0 0 0  Down, Depressed, Hopeless 0 0 2  PHQ - 2 Score 0 0 2  Altered sleeping  0 3  Tired, decreased energy  0 3  Change in appetite  0 0  Feeling bad or failure about yourself   0 1  Trouble concentrating  0 3  Moving slowly or fidgety/restless  0 0  Suicidal thoughts  0 0  PHQ-9 Score  0 12  Difficult doing work/chores  Not difficult at all Somewhat difficult   Health Maintenance: -Blood work UTD -Mammogram/DEXA not interested in screening at this time  Past Medical History:  Diagnosis Date   CHF (congestive heart failure) (HCC)    Colitis, ischemic (HCC)    Fibromyalgia    Hypertension    Leaky heart valve    Lupus (HCC)    Osteoarthritis    PTSD (post-traumatic stress disorder)    Rheumatoid arthritis ([place])     Past Surgical History:  Procedure Laterality Date   TONSILLECTOMY      Family History  Problem Relation Age of Onset   Cancer Mother    Alzheimer's disease Mother    Cancer Father  prostate   ADD / ADHD Son     Social History   Socioeconomic History   Marital status: Married    Spouse name: Not on file   Number of children: Not on file   Years of education: Not on file   Highest education level: Not on file  Occupational History   Not on file  Tobacco Use   Smoking status: Never   Smokeless tobacco: Not on file  Vaping Use   Vaping Use: Never used  Substance and Sexual Activity   Alcohol use: No   Drug use: Not Currently   Sexual activity: Not Currently  Other Topics Concern   Not on file   Social History Narrative   Not on file   Social Determinants of Health   Financial Resource Strain: Not on file  Food Insecurity: No Food Insecurity (07/22/2021)   Hunger Vital Sign    Worried About Running Out of Food in the Last Year: Never true    Ran Out of Food in the Last Year: Never true  Transportation Needs: No Transportation Needs (07/22/2021)   PRAPARE - Hydrologist (Medical): No    Lack of Transportation (Non-Medical): No  Physical Activity: Not on file  Stress: Stress Concern Present (07/22/2021)   [place]    Feeling of Stress : To some extent  Social Connections: Socially Isolated (07/22/2021)   Social Connection and Isolation Panel [NHANES]    Frequency of Communication with Friends and Family: Once a week    Frequency of Social Gatherings with Friends and Family: Once a week    Attends Religious Services: Never    Marine scientist or Organizations: No    Attends Archivist Meetings: Never    Marital Status: Divorced  Human resources officer Violence: Not on file    ROS Review of Systems  Constitutional:  Positive for fatigue. Negative for chills and fever.  Eyes:  Negative for visual disturbance.  Respiratory:  Negative for cough and shortness of breath.   Cardiovascular:  Negative for chest pain, palpitations and leg swelling.  Gastrointestinal:  Negative for abdominal pain, blood in stool, constipation, diarrhea, nausea and vomiting.  Genitourinary:  Negative for dysuria and hematuria.  Musculoskeletal:  Positive for arthralgias.  Neurological:  Positive for numbness. Negative for dizziness and headaches.    Objective:   Today's Vitals: There were no vitals taken for this visit.  Physical Exam Constitutional:      Appearance: Normal appearance.  HENT:     Head: Normocephalic and atraumatic.  Eyes:     Conjunctiva/sclera: Conjunctivae normal.   Cardiovascular:     Rate and Rhythm: Normal rate and regular rhythm.     Pulses:          Dorsalis pedis pulses are 2+ on the right side and 2+ on the left side.  Pulmonary:     Effort: Pulmonary effort is normal.     Breath sounds: Normal breath sounds.  Abdominal:     General: There is no distension.     Palpations: Abdomen is soft.     Tenderness: There is no abdominal tenderness. There is no guarding or rebound.     Comments: RUQ and LUQ pain to palpation   Musculoskeletal:     Right lower leg: No edema.     Left lower leg: No edema.     Right foot: Normal range of motion. No deformity, bunion,  Charcot foot, foot drop or prominent metatarsal heads.     Left foot: Normal range of motion. No deformity, bunion, Charcot foot, foot drop or prominent metatarsal heads.  Feet:     Right foot:     Protective Sensation: 6 sites tested.  3 sites sensed.     Skin integrity: Skin integrity normal.     Toenail Condition: Right toenails are normal.     Left foot:     Protective Sensation: 6 sites tested.  2 sites sensed.     Skin integrity: Skin integrity normal.     Toenail Condition: Left toenails are normal.  Skin:    General: Skin is warm and dry.  Neurological:     General: No focal deficit present.     Mental Status: She is alert. Mental status is at baseline.  Psychiatric:        Mood and Affect: Mood normal.        Behavior: Behavior normal.     Assessment & Plan:   1. Pain in joint, multiple sites: Questionable diagnosis in the past of autoimmune disease like RA, fibromyalgia? Unable to verify, still complaining of pain in multiple joints and now with numbness in both feet. Start autoimmune work up including ANA, inflammatory markers, RF. Follow up in 1 month for recheck.   - Antinuclear Antib (ANA) - C-reactive protein - Sed Rate (ESR) - Rheumatoid Factor  2. Numbness and tingling of both feet: Decreased sensation on exam to monofilament, L>R. Labs as above with  additional TSH, A1c, Vitamin B12, Vitamin D.   - B12 and Folate Panel - TSH - Vitamin D (25 hydroxy) - COMPLETE METABOLIC PANEL WITH GFR - HgB A1c  3. Anemia, unspecified type: Low last month after GI bleed, recheck today. Abdominal symptoms have resolved, still on Protonix 40 mg daily. Planning to see GI next week.   - CBC w/Diff/Platelet  4. PTSD (post-traumatic stress disorder)/Anxiety: Doing well with change of Effexor dose. Continue 150 mg in the morning and 75 mg at night. Referral placed to social work to help with mental help resources and counseling services.   - Vitamin D (25 hydroxy) - AMB Referral to [place]  5. Encounter for vitamin deficiency screening  - Vitamin D (25 hydroxy)  6. Ambulatory dysfunction: Walks with a cane outside of her house but unsteady on her feet due to arthralgias, referral to home health for home aid and PT placed today.   - Ambulatory referral to Home Health  7. Lipid screening: Lipid screening today, patient is fasting.  - Lipid Profile  8. Vaccine for streptococcus pneumoniae and influenza: Pneumonia vaccine administered today.   - Pneumococcal conjugate vaccine 20-valent (Prevnar 20)  Outpatient Encounter Medications as of 08/16/2021  Medication Sig   acetaminophen (TYLENOL) 650 MG CR tablet Take 650 mg by mouth every 8 (eight) hours as needed for pain.   pantoprazole (PROTONIX) 40 MG tablet Take 1 tablet (40 mg total) by mouth 2 (two) times daily.   venlafaxine XR (EFFEXOR XR) 150 MG 24 hr capsule Take 1 capsule (150 mg total) by mouth daily with breakfast. Brand name please per patient's wishes.   venlafaxine XR (EFFEXOR XR) 75 MG 24 hr capsule Take 1 capsule (75 mg total) by mouth daily with breakfast. Brand name please per patient's wishes.   No facility-administered encounter medications on file as of 08/16/2021.    Follow-up: No follow-ups on file.   Teodora Medici, DO

## 2021-08-15 NOTE — Progress Notes (Signed)
This encounter was created in error - please disregard.

## 2021-08-16 ENCOUNTER — Ambulatory Visit: Payer: 59 | Admitting: Internal Medicine

## 2021-08-16 ENCOUNTER — Telehealth: Payer: Medicare Other

## 2021-08-16 ENCOUNTER — Telehealth: Payer: Self-pay | Admitting: *Deleted

## 2021-08-16 NOTE — Telephone Encounter (Signed)
   08/16/2021  Ebonee M [place] [birth year] [id number]  Phone call to Stage manager of [employer] to discuss status of patient's housing. VM left requesting a return call.  Verna Czech, LCSW Clinical [facility] [phone]

## 2021-08-19 ENCOUNTER — Telehealth: Payer: Self-pay | Admitting: *Deleted

## 2021-08-19 ENCOUNTER — Telehealth: Payer: Medicare Other

## 2021-08-19 NOTE — Telephone Encounter (Signed)
  Care Management   Follow Up Note   08/19/2021 Name: Dawn [name] MRN: [id number] DOB: 03-24-1940   Referred by: Margarita Mail, DO Reason for referral : Care Coordination   An unsuccessful telephone outreach was attempted today. The patient was referred to the case management team for assistance with care management and care coordination.   Follow Up Plan: Telephone follow up appointment with care management team member scheduled for: 08/23/21  Verna Czech, LCSW Clinical Social Worker  [facility] Care Management [phone]

## 2021-08-22 ENCOUNTER — Telehealth: Payer: Self-pay | Admitting: *Deleted

## 2021-08-22 NOTE — Telephone Encounter (Signed)
   08/22/2021  Arretta M [place] 02/16/1941 [id number]   Return phone call from [facility], assistant housing director confirming that patient ha received her 30 day notice for eviction due to lack of payment and failing housekeeping.  Patient is able to appeal this notice, however per assistant director she has not called to do so. Patient possibly needing a higher level of care discussed.  Phone call to patient to reinforce need to appeal eviction  as well as to consider a higher level of care(Assisted Living Facility) Voicemail message left requesting a return call.  Verna Czech, LCSW Clinical [facility] [phone]

## 2021-08-23 ENCOUNTER — Ambulatory Visit: Payer: Medicare Other | Admitting: *Deleted

## 2021-08-23 DIAGNOSIS — F4310 Post-traumatic stress disorder, unspecified: Secondary | ICD-10-CM

## 2021-08-23 DIAGNOSIS — I10 Essential (primary) hypertension: Secondary | ICD-10-CM

## 2021-08-23 DIAGNOSIS — F419 Anxiety disorder, unspecified: Secondary | ICD-10-CM

## 2021-08-23 NOTE — Chronic Care Management (AMB) (Addendum)
Care Management    Clinical Social Work Note  08/23/2021 Name: Dawn [name] MRN: [id number] DOB: 09/28/1940  Dawn [name] is a 81 y.o. year old female who is a primary care patient of Teodora Medici, DO. The CCM team was consulted to assist the patient with chronic disease management and/or care coordination needs related to: Marland Kitchen   Engaged with patient by telephone for [employer] follow up visit in response to provider referral for social work chronic care management and care coordination services.   Consent to Services:  The patient was given the following information about Chronic Care Management services today, agreed to services, and gave verbal consent: 1. CCM service includes personalized support from designated clinical staff supervised by the primary care provider, including individualized plan of care and coordination with other care providers 2. 24/7 contact phone numbers for assistance for urgent and routine care needs. 3. Service will only be billed when office clinical staff spend 20 minutes or more in a month to coordinate care. 4. Only one practitioner may furnish and bill the service in a calendar month. 5.The patient may stop CCM services at any time (effective at the end of the month) by phone call to the office staff. 6. The patient will be responsible for cost sharing (co-pay) of up to 20% of the service fee (after annual deductible is met). Patient agreed to services and consent obtained.  Patient agreed to services and consent obtained.   Assessment: Review of patient past medical history, allergies, medications, and health status, including review of relevant consultants reports was performed today as part of a comprehensive evaluation and provision of chronic care management and care coordination services.     SDOH (Social Determinants of Health) assessments and interventions performed:    Advanced Directives Status: Not addressed in this encounter.  CCM  Care Plan  Allergies  Allergen Reactions   Doxycycline Nausea And Vomiting   Nsaids Other (See Comments)    GI Bleed   Penicillin G Swelling   Sulfa Antibiotics Nausea And Vomiting    Outpatient Encounter Medications as of 08/23/2021  Medication Sig   acetaminophen (TYLENOL) 650 MG CR tablet Take 650 mg by mouth every 8 (eight) hours as needed for pain.   pantoprazole (PROTONIX) 40 MG tablet Take 1 tablet (40 mg total) by mouth 2 (two) times daily.   venlafaxine XR (EFFEXOR XR) 150 MG 24 hr capsule Take 1 capsule (150 mg total) by mouth daily with breakfast. Brand name please per patient's wishes.   venlafaxine XR (EFFEXOR XR) 75 MG 24 hr capsule Take 1 capsule (75 mg total) by mouth daily with breakfast. Brand name please per patient's wishes.   No facility-administered encounter medications on file as of 08/23/2021.    Patient Active Problem List   Diagnosis Date Noted   Type 2 diabetes mellitus with diabetic neuropathy, unspecified ([place]) 07/26/2021   Ambulatory dysfunction 07/26/2021   Fibromyalgia 06/03/2021   Melena 06/01/2021   Hypertension    History of ischemic colitis 2013    Hyperglycemia    Generalized anxiety disorder 04/16/2017   Major depressive disorder 04/16/2017   Panic disorder 04/16/2017   PTSD (post-traumatic stress disorder) 04/16/2017   DDD (degenerative disc disease), lumbar 07/17/2014   Lumbar facet arthropathy 07/17/2014   Lumbar radicular pain 07/17/2014   Sacroiliac joint dysfunction 07/17/2014    Conditions to be addressed/monitored: Anxiety; Mental Health Concerns   Care Plan : General Social Work (Adult)  Updates made by [name], Baruch Gouty  M, LCSW since 08/23/2021 12:00 AM     Problem: CHL AMB "PATIENT-SPECIFIC PROBLEM"   Note:   CARE PLAN ENTRY (see longitudinal plan of care for additional care plan information)  Current Barriers:  Knowledge deficits related to accessing mental health provider in patient with Anxiety  Patient is experiencing  symptoms of  Post Traumatic Stress Disorder which seem to be exacerbated by stress   Patient confirmed difficulty breathing, difficulty organizing her thoughts, which leads to inability leaving her home at times Patient also requesting personal care services due to increased arthritis pain Transportation to obtain money to pay rent   Patient needs Support, Education, and Care Coordination in order to meet unmet mental health needs  ADL IADL limitations and Mental Health Concerns   Clinical Social Work Goal(s):  Over the next 90 days, patient will work with SW bi-weekly by telephone or in person to reduce or manage symptoms of anxiety until connected for ongoing counseling resources.  Patient will implement clinical interventions discussed today to decreases symptoms of anxiety and increase knowledge and/or ability of: coping skills. Over the next 90 days, patient will demonstrate improved adherence to prescribed treatment plan for post traumatic stress disorder as evidenced by active participation in therapy   Interventions:   Continued to assess patient's understanding, education, previous treatment and care coordination needs  Follow up on status of patient's rent, confirmed that patient's landlord Lars Mage was contacted and patient has received a 30 day notice for eviction, she would be able to file an appeal It was confirmed by [employer] that patient has the right  to file an appeal, however patient has not filed an appeal or contacted legal Aid for assistance-filing the appeal highly encouraged as well as contacting Legal Aid for additional support Emotional support provided, Solution-Focused Strategies employed Placement in an Assisted Living discussed, patient willing to consider this option as a Plan B Confirmed that patient's family has been made aware of 30 day notice for eviction Other interventions include: Solution-Focused Strategies employed:  Active listening / Reflection utilized   Emotional Support Provided   Patient Self Care Activities & Deficits:  Patient is unable to independently navigate community resource options without care coordination support Patient is able to implement clinical interventions discussed today and is motivated for treatment  Patient will select one of the agencies from the list provided and call to schedule an appointment  Motivation for treatment  Please see past updates related to this goal by clicking on the "Past Updates" button in the selected goal        Follow Up Plan: SW will follow up with patient by phone over the next 7 business days      Elliot Gurney, [place] Worker  [facility] [phone]

## 2021-08-23 NOTE — Patient Instructions (Signed)
Visit Information  Thank you for taking time to visit with me today. Please don't hesitate to contact me if I can be of assistance to you before our next scheduled telephone appointment.  Following are the goals we discussed today:   - begin personal counseling - learn and use visualization or guided imagery - practice relaxation or meditation daily - talk about feelings with a friend, family or spiritual advisor - practice positive thinking and self-talk  -contact legal aid to assist with housing [phone] -contact [employer] to confirm balance due for rent and to file an appeal regarding eviction  Our next appointment is by telephone on 08/30/21 at 1:00pm  Please call the care guide team at [phone] if you need to cancel or reschedule your appointment.   If you are experiencing a Mental Health or Behavioral Health Crisis or need someone to talk to, please call the Suicide and Crisis Lifeline: 988   Patient verbalizes understanding of instructions and care plan provided today and agrees to view in MyChart. Active MyChart status and patient understanding of how to access instructions and care plan via MyChart confirmed with patient.     Telephone follow up appointment with care management team member scheduled for: 08/30/21  Verna Czech, LCSW Clinical Social Worker  [facility] Care Management [phone]

## 2021-08-26 ENCOUNTER — Ambulatory Visit: Payer: Self-pay | Admitting: *Deleted

## 2021-08-26 DIAGNOSIS — F4310 Post-traumatic stress disorder, unspecified: Secondary | ICD-10-CM

## 2021-08-26 DIAGNOSIS — I10 Essential (primary) hypertension: Secondary | ICD-10-CM

## 2021-08-26 DIAGNOSIS — F411 Generalized anxiety disorder: Secondary | ICD-10-CM

## 2021-08-26 NOTE — Patient Instructions (Addendum)
Visit Information  Thank you for taking time to visit with me today. Please don't hesitate to contact me if I can be of assistance to you before our next scheduled telephone appointment.  Following are the goals we discussed today:   - begin personal counseling - learn and use visualization or guided imagery - practice relaxation or meditation daily - talk about feelings with a friend, family or spiritual advisor - practice positive thinking and self-talk  -contact legal aid to assist with housing [phone] -contact [employer] to confirm balance due for rent and to file an appeal regarding eviction  Our next appointment is by telephone on 08/30/21 at 2:00pm  Please call the care guide team at [phone] if you need to cancel or reschedule your appointment.   If you are experiencing a Mental Health or Behavioral Health Crisis or need someone to talk to, please call the Suicide and Crisis Lifeline: 988   Patient verbalizes understanding of instructions and care plan provided today and agrees to view in MyChart. Active MyChart status and patient understanding of how to access instructions and care plan via MyChart confirmed with patient.     Telephone follow up appointment with care management team member scheduled for: 08/30/21   Verna Czech, LCSW Clinical Social Worker  [facility] Care Management [phone]

## 2021-08-26 NOTE — Chronic Care Management (AMB) (Signed)
Chronic Care Management    Clinical Social Work Note  08/26/2021 Name: Dawn [name] MRN: [id number] DOB: October 12, 1940  Dawn [name] is a 81 y.o. year old female who is a primary care patient of Margarita Mail, DO. The CCM team was consulted to assist the patient with chronic disease management and/or care coordination needs related to: [facility]  and Mental Health Counseling and Resources.   Engaged with patient by telephone for follow up visit in response to provider referral for social work chronic care management and care coordination services.   Consent to Services:  The patient was given information about Chronic Care Management services, agreed to services, and gave verbal consent prior to initiation of services.  Please see initial visit note for detailed documentation.   Patient agreed to services and consent obtained.   Assessment: Review of patient past medical history, allergies, medications, and health status, including review of relevant consultants reports was performed today as part of a comprehensive evaluation and provision of chronic care management and care coordination services.     SDOH (Social Determinants of Health) assessments and interventions performed:    Advanced Directives Status: Not addressed in this encounter.  CCM Care Plan  Allergies  Allergen Reactions   Doxycycline Nausea And Vomiting   Nsaids Other (See Comments)    GI Bleed   Penicillin G Swelling   Sulfa Antibiotics Nausea And Vomiting    Outpatient Encounter Medications as of 08/26/2021  Medication Sig   acetaminophen (TYLENOL) 650 MG CR tablet Take 650 mg by mouth every 8 (eight) hours as needed for pain.   pantoprazole (PROTONIX) 40 MG tablet Take 1 tablet (40 mg total) by mouth 2 (two) times daily.   venlafaxine XR (EFFEXOR XR) 150 MG 24 hr capsule Take 1 capsule (150 mg total) by mouth daily with breakfast. Brand name please per patient's wishes.   venlafaxine XR (EFFEXOR  XR) 75 MG 24 hr capsule Take 1 capsule (75 mg total) by mouth daily with breakfast. Brand name please per patient's wishes.   No facility-administered encounter medications on file as of 08/26/2021.    Patient Active Problem List   Diagnosis Date Noted   Type 2 diabetes mellitus with diabetic neuropathy, unspecified (HCC) 07/26/2021   Ambulatory dysfunction 07/26/2021   Fibromyalgia 06/03/2021   Melena 06/01/2021   Hypertension    History of ischemic colitis 2013    Hyperglycemia    Generalized anxiety disorder 04/16/2017   Major depressive disorder 04/16/2017   Panic disorder 04/16/2017   PTSD (post-traumatic stress disorder) 04/16/2017   DDD (degenerative disc disease), lumbar 07/17/2014   Lumbar facet arthropathy 07/17/2014   Lumbar radicular pain 07/17/2014   Sacroiliac joint dysfunction 07/17/2014    Conditions to be addressed/monitored: Anxiety; Mental Health Concerns   Care Plan : General Social Work (Adult)  Updates made by Wenda Overland, LCSW since 08/26/2021 12:00 AM     Problem: CHL AMB "PATIENT-SPECIFIC PROBLEM"   Note:   CARE PLAN ENTRY (see longitudinal plan of care for additional care plan information)  Current Barriers:  Knowledge deficits related to accessing mental health provider in patient with Anxiety  Patient is experiencing symptoms of  Post Traumatic Stress Disorder which seem to be exacerbated by stress   Patient confirmed difficulty breathing, difficulty organizing her thoughts, which leads to inability leaving her home at times Patient also requesting personal care services due to increased arthritis pain Transportation to obtain money to pay rent   Patient needs Support,  Education, and Care Coordination in order to meet unmet mental health needs  ADL IADL limitations and Mental Health Concerns   Clinical Social Work Goal(s):  Over the next 90 days, patient will work with SW bi-weekly by telephone or in person to reduce or manage symptoms of  anxiety until connected for ongoing counseling resources.  Patient will implement clinical interventions discussed today to decreases symptoms of anxiety and increase knowledge and/or ability of: coping skills. Over the next 90 days, patient will demonstrate improved adherence to prescribed treatment plan for post traumatic stress disorder as evidenced by active participation in therapy   Interventions:   Continued to assess patient's understanding, education, previous treatment and care coordination needs  Follow up on status of patient's rent, confirmed that patient's landlord Elease Hashimoto was contacted and patient has received a 30 day notice for eviction, she would be able to file an appeal It was confirmed by [employer] that patient has the right  to file an appeal, however patient has not filed an appeal or contacted legal Aid for assistance-filing the appeal highly encouraged as well as contacting Legal Aid for additional support Emotional support provided, Solution-Focused Strategies employed Placement in an Assisted Living discussed, patient willing to consider this option as a Plan B Confirmed that patient's family has been made aware of 30 day notice for eviction Other interventions include: Solution-Focused Strategies employed:  Active listening / Reflection utilized  Emotional Support Provided  08/26/21 Phone call from patient stating that she has not filed the appeal with regards to her eviction. Per patient, she will need to go to the Housing [employer] to file this.  Patient urged to do so as soon as possible to discuss her options  Patient Self Care Activities & Deficits:  Patient is unable to independently navigate community resource options without care coordination support Patient is able to implement clinical interventions discussed today and is motivated for treatment  Patient will select one of the agencies from the list provided and call to schedule an appointment  Motivation for  treatment  Please see past updates related to this goal by clicking on the "Past Updates" button in the selected goal        Follow Up Plan: SW will follow up with patient by phone over the next 14 business days       [place], [place] Clinical Social Worker  [facility] Care Management [phone]

## 2021-08-27 ENCOUNTER — Telehealth: Payer: Self-pay | Admitting: *Deleted

## 2021-08-27 ENCOUNTER — Ambulatory Visit: Payer: Self-pay | Admitting: *Deleted

## 2021-08-27 DIAGNOSIS — F4310 Post-traumatic stress disorder, unspecified: Secondary | ICD-10-CM

## 2021-08-27 DIAGNOSIS — I10 Essential (primary) hypertension: Secondary | ICD-10-CM

## 2021-08-27 DIAGNOSIS — F419 Anxiety disorder, unspecified: Secondary | ICD-10-CM

## 2021-08-27 NOTE — Progress Notes (Signed)
This encounter was created in error - please disregard.

## 2021-08-27 NOTE — Chronic Care Management (AMB) (Addendum)
Care Management    Clinical Social Work Note  08/27/2021 Name: Dawn [name] MRN: [id number] DOB: 07-Nov-1940  Dawn [name] is a 81 y.o. year old female who is a primary care patient of Margarita Mail, DO. The CCM team was consulted to assist the patient with chronic disease management and/or care coordination needs related to: [facility]  and Mental Health Counseling and Resources.   Engaged with patient by telephone for follow up visit in response to provider referral for social work chronic care management and care coordination services.   Consent to Services:  The patient was given information about Chronic Care Management services, agreed to services, and gave verbal consent prior to initiation of services.  Please see initial visit note for detailed documentation.   Patient agreed to services and consent obtained.   Assessment: Review of patient past medical history, allergies, medications, and health status, including review of relevant consultants reports was performed today as part of a comprehensive evaluation and provision of chronic care management and care coordination services.     SDOH (Social Determinants of Health) assessments and interventions performed:    Advanced Directives Status: Not addressed in this encounter.  CCM Care Plan  Allergies  Allergen Reactions   Doxycycline Nausea And Vomiting   Nsaids Other (See Comments)    GI Bleed   Penicillin G Swelling   Sulfa Antibiotics Nausea And Vomiting    Outpatient Encounter Medications as of 08/27/2021  Medication Sig   acetaminophen (TYLENOL) 650 MG CR tablet Take 650 mg by mouth every 8 (eight) hours as needed for pain.   pantoprazole (PROTONIX) 40 MG tablet Take 1 tablet (40 mg total) by mouth 2 (two) times daily.   venlafaxine XR (EFFEXOR XR) 150 MG 24 hr capsule Take 1 capsule (150 mg total) by mouth daily with breakfast. Brand name please per patient's wishes.   venlafaxine XR (EFFEXOR XR) 75  MG 24 hr capsule Take 1 capsule (75 mg total) by mouth daily with breakfast. Brand name please per patient's wishes.   No facility-administered encounter medications on file as of 08/27/2021.    Patient Active Problem List   Diagnosis Date Noted   Type 2 diabetes mellitus with diabetic neuropathy, unspecified (HCC) 07/26/2021   Ambulatory dysfunction 07/26/2021   Fibromyalgia 06/03/2021   Melena 06/01/2021   Hypertension    History of ischemic colitis 2013    Hyperglycemia    Generalized anxiety disorder 04/16/2017   Major depressive disorder 04/16/2017   Panic disorder 04/16/2017   PTSD (post-traumatic stress disorder) 04/16/2017   DDD (degenerative disc disease), lumbar 07/17/2014   Lumbar facet arthropathy 07/17/2014   Lumbar radicular pain 07/17/2014   Sacroiliac joint dysfunction 07/17/2014    Conditions to be addressed/monitored: Anxiety; Housing barriers and Mental Health Concerns   Care Plan : General Social Work (Adult)  Updates made by [employer], Clent Jacks, LCSW since 08/27/2021 12:00 AM     Problem: CHL AMB "PATIENT-SPECIFIC PROBLEM"   Note:   CARE PLAN ENTRY (see longitudinal plan of care for additional care plan information)  Current Barriers:  Knowledge deficits related to accessing mental health provider in patient with Anxiety  Patient is experiencing symptoms of  Post Traumatic Stress Disorder which seem to be exacerbated by stress   Patient confirmed difficulty breathing, difficulty organizing her thoughts, which leads to inability leaving her home at times Patient also requesting personal care services due to increased arthritis pain Transportation to obtain money to pay rent   Patient  needs Support, Education, and Care Coordination in order to meet unmet mental health needs  ADL IADL limitations and Mental Health Concerns   Clinical Social Work Goal(s):  Over the next 90 days, patient will work with SW bi-weekly by telephone or in person to reduce or manage  symptoms of anxiety until connected for ongoing counseling resources.  Patient will implement clinical interventions discussed today to decreases symptoms of anxiety and increase knowledge and/or ability of: coping skills. Over the next 90 days, patient will demonstrate improved adherence to prescribed treatment plan for post traumatic stress disorder as evidenced by active participation in therapy   Interventions:   Continued to assess patient's understanding, education, previous treatment and care coordination needs  Follow up on status of patient's rent, confirmed that patient's landlord Dawn [name] was contacted and patient has received a 30 day notice for eviction, she would be able to file an appeal It was confirmed by [employer] that patient has the right  to file an appeal, however patient has not filed an appeal or contacted legal Aid for assistance-filing the appeal highly encouraged as well as contacting Legal Aid for additional support Emotional support provided, Solution-Focused Strategies employed Placement in an Assisted Living discussed, patient willing to consider this option as a Plan B Confirmed that patient's family has been made aware of 30 day notice for eviction Other interventions include: Solution-Focused Strategies employed:  Active listening / Reflection utilized  Emotional Support Provided  Update 08/26/21 Phone call from patient stating that she has not filed the appeal with regards to her eviction. Per patient, she will need to go to the Housing Authority to file this.  Patient urged to do so as soon as possible to discuss her options  Patient Self Care Activities & Deficits:  Patient is unable to independently navigate community resource options without care coordination support Patient is able to implement clinical interventions discussed today and is motivated for treatment  Patient will select one of the agencies from the list provided and call to schedule an appointment   Motivation for treatment  Please see past updates related to this goal by clicking on the "Past Updates" button in the selected goal        Follow Up Plan: SW will follow up with patient by phone over the next 14 business days      Verna Czech, [place] Clinical Social Worker  [facility] Care Management [phone]

## 2021-08-27 NOTE — Progress Notes (Deleted)
New Patient Office Visit  Subjective:  Patient ID: Dawn [name], female    DOB: 09-03-1940  Age: 81 y.o. MRN: [id number]  CC:  No chief complaint on file.   HPI Dawn [name] presents for follow up. She was recently seen in the hospital for GI bleed, was seen here and then went back to the ER for same issue. Ended up waiting for 4 hours without being seen. States that since then, her abdominal pain has completely resolved. She denies nausea, vomiting, constipation/diarrhea, bloody stools or dark colored stools. Hemoglobin low from 06/03/21 11.6, lipase negative, LFTs mildly elevated with AST 44 and ALT 58, alk phos 225. She is on Protonix 40 mg daily and will see the GI next week.   Hypertension/CHF: -Medications: None, was on a beta blocker previously for about 1 year but ran out of it. No side effects. 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 Also chronic fatigue and numbness in her feet.   -Decreased sensation with monofilament L>R -TSH, vitamin B12/folate negative, A1c showing newly diabetic   Diabetes, Type 2: -Last A1c 6.7% 6/23 -Medications: Nothing yet  -Checking BG at home: *** -Fasting home BG: *** -Post-prandial home BG: *** -Highest home BG since last visit: *** -Lowest home BG since last visit: *** -Diet: *** -Exercise: *** -Eye exam: *** -Foot exam: Decreased sensation 6/23 -Microalbumin: *** -Statin: *** -PNA vaccine: *** -Denies symptoms of hypoglycemia, polyuria, polydipsia, numbness extremities, foot ulcers/trauma. ***  MDD: -Mood status: better -Current treatment: Effexor XR 150 in the am and 75 XR in the pm -Satisfied with current  treatment?: yes -Symptom severity: moderate -Duration of current treatment : years - had been on Effexor 225 for years, decreased in the hospital -Side effects: no Medication compliance: excellent compliance Psychotherapy/counseling: no in the past, interested in starting therapy again  Depressed mood: yes Anxious mood: yes     07/22/2021    9:21 AM 07/01/2021    2:15 PM 06/03/2021    9:20 AM  Depression screen PHQ 2/9  Decreased Interest 0 0 0  Down, Depressed, Hopeless 0 0 2  PHQ - 2 Score 0 0 2  Altered sleeping  0 3  Tired, decreased energy  0 3  Change in appetite  0 0  Feeling bad or failure about yourself   0 1  Trouble concentrating  0 3  Moving slowly or fidgety/restless  0 0  Suicidal thoughts  0 0  PHQ-9 Score  0 12  Difficult doing work/chores  Not difficult at all Somewhat difficult   Health Maintenance: -Blood work UTD -Mammogram/DEXA not interested in screening at this time  Past Medical History:  Diagnosis Date   CHF (congestive heart failure) (HCC)    Colitis, ischemic (HCC)    Fibromyalgia    Hypertension    Leaky heart valve    Lupus (HCC)    Osteoarthritis    PTSD (post-traumatic stress disorder)    Rheumatoid arthritis ([place])     Past Surgical History:  Procedure Laterality Date   TONSILLECTOMY      Family History  Problem Relation Age of Onset   Cancer Mother    Alzheimer's disease Mother    Cancer Father  prostate   ADD / ADHD Son     Social History   Socioeconomic History   Marital status: Married    Spouse name: Not on file   Number of children: Not on file   Years of education: Not on file   Highest education level: Not on file  Occupational History   Not on file  Tobacco Use   Smoking status: Never   Smokeless tobacco: Not on file  Vaping Use   Vaping Use: Never used  Substance and Sexual Activity   Alcohol use: No   Drug use: Not Currently   Sexual activity: Not Currently  Other Topics Concern   Not on file   Social History Narrative   Not on file   Social Determinants of Health   Financial Resource Strain: Not on file  Food Insecurity: No Food Insecurity (07/22/2021)   Hunger Vital Sign    Worried About Running Out of Food in the Last Year: Never true    Ran Out of Food in the Last Year: Never true  Transportation Needs: No Transportation Needs (07/22/2021)   PRAPARE - Hydrologist (Medical): No    Lack of Transportation (Non-Medical): No  Physical Activity: Not on file  Stress: Stress Concern Present (07/22/2021)   [employer]    Feeling of Stress : To some extent  Social Connections: Socially Isolated (07/22/2021)   Social Connection and Isolation Panel [NHANES]    Frequency of Communication with Friends and Family: Once a week    Frequency of Social Gatherings with Friends and Family: Once a week    Attends Religious Services: Never    Marine scientist or Organizations: No    Attends Archivist Meetings: Never    Marital Status: Divorced  Human resources officer Violence: Not on file    ROS Review of Systems  Constitutional:  Positive for fatigue. Negative for chills and fever.  Eyes:  Negative for visual disturbance.  Respiratory:  Negative for cough and shortness of breath.   Cardiovascular:  Negative for chest pain, palpitations and leg swelling.  Gastrointestinal:  Negative for abdominal pain, blood in stool, constipation, diarrhea, nausea and vomiting.  Genitourinary:  Negative for dysuria and hematuria.  Musculoskeletal:  Positive for arthralgias.  Neurological:  Positive for numbness. Negative for dizziness and headaches.    Objective:   Today's Vitals: There were no vitals taken for this visit.  Physical Exam Constitutional:      Appearance: Normal appearance.  HENT:     Head: Normocephalic and atraumatic.  Eyes:     Conjunctiva/sclera: Conjunctivae normal.   Cardiovascular:     Rate and Rhythm: Normal rate and regular rhythm.     Pulses:          Dorsalis pedis pulses are 2+ on the right side and 2+ on the left side.  Pulmonary:     Effort: Pulmonary effort is normal.     Breath sounds: Normal breath sounds.  Abdominal:     General: There is no distension.     Palpations: Abdomen is soft.     Tenderness: There is no abdominal tenderness. There is no guarding or rebound.     Comments: RUQ and LUQ pain to palpation   Musculoskeletal:     Right lower leg: No edema.     Left lower leg: No edema.     Right foot: Normal range of motion. No deformity, bunion,  Charcot foot, foot drop or prominent metatarsal heads.     Left foot: Normal range of motion. No deformity, bunion, Charcot foot, foot drop or prominent metatarsal heads.  Feet:     Right foot:     Protective Sensation: 6 sites tested.  3 sites sensed.     Skin integrity: Skin integrity normal.     Toenail Condition: Right toenails are normal.     Left foot:     Protective Sensation: 6 sites tested.  2 sites sensed.     Skin integrity: Skin integrity normal.     Toenail Condition: Left toenails are normal.  Skin:    General: Skin is warm and dry.  Neurological:     General: No focal deficit present.     Mental Status: She is alert. Mental status is at baseline.  Psychiatric:        Mood and Affect: Mood normal.        Behavior: Behavior normal.     Assessment & Plan:   1. Pain in joint, multiple sites: Questionable diagnosis in the past of autoimmune disease like RA, fibromyalgia? Unable to verify, still complaining of pain in multiple joints and now with numbness in both feet. Start autoimmune work up including ANA, inflammatory markers, RF. Follow up in 1 month for recheck.   - Antinuclear Antib (ANA) - C-reactive protein - Sed Rate (ESR) - Rheumatoid Factor  2. Numbness and tingling of both feet: Decreased sensation on exam to monofilament, L>R. Labs as above with  additional TSH, A1c, Vitamin B12, Vitamin D.   - B12 and Folate Panel - TSH - Vitamin D (25 hydroxy) - COMPLETE METABOLIC PANEL WITH GFR - HgB A1c  3. Anemia, unspecified type: Low last month after GI bleed, recheck today. Abdominal symptoms have resolved, still on Protonix 40 mg daily. Planning to see GI next week.   - CBC w/Diff/Platelet  4. PTSD (post-traumatic stress disorder)/Anxiety: Doing well with change of Effexor dose. Continue 150 mg in the morning and 75 mg at night. Referral placed to social work to help with mental help resources and counseling services.   - Vitamin D (25 hydroxy) - AMB Referral to [place]  5. Encounter for vitamin deficiency screening  - Vitamin D (25 hydroxy)  6. Ambulatory dysfunction: Walks with a cane outside of her house but unsteady on her feet due to arthralgias, referral to home health for home aid and PT placed today.   - Ambulatory referral to Home Health  7. Lipid screening: Lipid screening today, patient is fasting.  - Lipid Profile  8. Vaccine for streptococcus pneumoniae and influenza: Pneumonia vaccine administered today.   - Pneumococcal conjugate vaccine 20-valent (Prevnar 20)  Outpatient Encounter Medications as of 08/28/2021  Medication Sig   acetaminophen (TYLENOL) 650 MG CR tablet Take 650 mg by mouth every 8 (eight) hours as needed for pain.   pantoprazole (PROTONIX) 40 MG tablet Take 1 tablet (40 mg total) by mouth 2 (two) times daily.   venlafaxine XR (EFFEXOR XR) 150 MG 24 hr capsule Take 1 capsule (150 mg total) by mouth daily with breakfast. Brand name please per patient's wishes.   venlafaxine XR (EFFEXOR XR) 75 MG 24 hr capsule Take 1 capsule (75 mg total) by mouth daily with breakfast. Brand name please per patient's wishes.   No facility-administered encounter medications on file as of 08/28/2021.    Follow-up: No follow-ups on file.   Teodora Medici, DO

## 2021-08-27 NOTE — Telephone Encounter (Signed)
  Care Management   Follow Up Note   08/27/2021 Name: Dawn [name] MRN: [id number] DOB: Apr 12, 1940   Referred by: Margarita Mail, DO Reason for referral : Care Coordination   An unsuccessful telephone outreach was attempted today. The patient was referred to the case management team for assistance with care management and care coordination.   Follow Up Plan: Telephone follow up appointment with care management team member scheduled for: 08/31/21  .cls

## 2021-08-27 NOTE — Addendum Note (Signed)
Addended by: Wenda Overland on: 08/27/2021 01:21 PM   Modules accepted: Orders, Level of Service

## 2021-08-27 NOTE — Addendum Note (Signed)
Addended by: Wenda Overland on: 08/27/2021 01:08 PM   Modules accepted: Orders, Level of Service

## 2021-08-27 NOTE — Patient Instructions (Signed)
Visit Information  Thank you for taking time to visit with me today. Please don't hesitate to contact me if I can be of assistance to you before our next scheduled telephone appointment.  Following are the goals we discussed today:   - begin personal counseling - learn and use visualization or guided imagery - practice relaxation or meditation daily - talk about feelings with a friend, family or spiritual advisor - practice positive thinking and self-talk  -contact legal aid to assist with housing [phone] -contact [employer] to confirm balance due for rent and to file an appeal regarding eviction  Our next appointment is by telephone on 08/30/21 at 2pm  Please call the care guide team at [phone] if you need to cancel or reschedule your appointment.   If you are experiencing a Mental Health or Behavioral Health Crisis or need someone to talk to, please call the Suicide and Crisis Lifeline: 988   Patient verbalizes understanding of instructions and care plan provided today and agrees to view in MyChart. Active MyChart status and patient understanding of how to access instructions and care plan via MyChart confirmed with patient.      Verna Czech, LCSW [facility] [phone]

## 2021-08-28 ENCOUNTER — Ambulatory Visit: Payer: Medicare Other | Admitting: Internal Medicine

## 2021-08-29 ENCOUNTER — Ambulatory Visit: Payer: Self-pay | Admitting: *Deleted

## 2021-08-29 DIAGNOSIS — F419 Anxiety disorder, unspecified: Secondary | ICD-10-CM

## 2021-08-29 DIAGNOSIS — F4310 Post-traumatic stress disorder, unspecified: Secondary | ICD-10-CM

## 2021-08-30 ENCOUNTER — Telehealth: Payer: Medicare Other

## 2021-08-30 NOTE — Chronic Care Management (AMB) (Signed)
Care Management Clinical Social Work Note  08/30/2021 Name: Dawn [name] MRN: [id number] DOB: [birth year]  Dawn [name] is a 81 y.o. year old female who is a primary care patient of Margarita Mail, DO.  The Care Management team was consulted for assistance with chronic disease management and coordination needs.  Engaged with patient by telephone for follow up visit in response to provider referral for social work chronic care management and care coordination services  Consent to Services:  [name] was given information about Care Management services today including:  Care Management services includes personalized support from designated clinical staff supervised by her physician, including individualized plan of care and coordination with other care providers 24/7 contact phone numbers for assistance for urgent and routine care needs. The patient may stop case management services at any time by phone call to the office staff.  Patient agreed to services and consent obtained.   Assessment: Review of patient past medical history, allergies, medications, and health status, including review of relevant consultants reports was performed today as part of a comprehensive evaluation and provision of chronic care management and care coordination services.  SDOH (Social Determinants of Health) assessments and interventions performed:    Advanced Directives Status: Not addressed in this encounter.  Care Plan  Allergies  Allergen Reactions   Doxycycline Nausea And Vomiting   Nsaids Other (See Comments)    GI Bleed   Penicillin G Swelling   Sulfa Antibiotics Nausea And Vomiting    Outpatient Encounter Medications as of 08/29/2021  Medication Sig   acetaminophen (TYLENOL) 650 MG CR tablet Take 650 mg by mouth every 8 (eight) hours as needed for pain.   pantoprazole (PROTONIX) 40 MG tablet Take 1 tablet (40 mg total) by mouth 2 (two) times daily.   venlafaxine XR (EFFEXOR XR) 150 MG 24  hr capsule Take 1 capsule (150 mg total) by mouth daily with breakfast. Brand name please per patient's wishes.   venlafaxine XR (EFFEXOR XR) 75 MG 24 hr capsule Take 1 capsule (75 mg total) by mouth daily with breakfast. Brand name please per patient's wishes.   No facility-administered encounter medications on file as of 08/29/2021.    Patient Active Problem List   Diagnosis Date Noted   Type 2 diabetes mellitus with diabetic neuropathy, unspecified (HCC) 07/26/2021   Ambulatory dysfunction 07/26/2021   Fibromyalgia 06/03/2021   Melena 06/01/2021   Hypertension    History of ischemic colitis 2013    Hyperglycemia    Generalized anxiety disorder 04/16/2017   Major depressive disorder 04/16/2017   Panic disorder 04/16/2017   PTSD (post-traumatic stress disorder) 04/16/2017   DDD (degenerative disc disease), lumbar 07/17/2014   Lumbar facet arthropathy 07/17/2014   Lumbar radicular pain 07/17/2014   Sacroiliac joint dysfunction 07/17/2014    Conditions to be addressed/monitored: Anxiety; Mental Health Concerns   Care Plan : General Social Work (Adult)  Updates made by Wenda Overland, LCSW since 08/30/2021 12:00 AM     Problem: [facility] AMB "PATIENT-SPECIFIC PROBLEM"   Note:   CARE PLAN ENTRY (see longitudinal plan of care for additional care plan information)  Current Barriers:  Knowledge deficits related to accessing mental health provider in patient with Anxiety  Patient is experiencing symptoms of  Post Traumatic Stress Disorder which seem to be exacerbated by stress   Patient confirmed difficulty breathing, difficulty organizing her thoughts, which leads to inability leaving her home at times Patient also requesting personal care services due to increased arthritis pain  Transportation to obtain money to pay rent   Patient needs Support, Education, and Care Coordination in order to meet unmet mental health needs  ADL IADL limitations and Mental Health Concerns   Clinical  Social Work Goal(s):  Over the next 90 days, patient will work with SW bi-weekly by telephone or in person to reduce or manage symptoms of anxiety until connected for ongoing counseling resources.  Patient will implement clinical interventions discussed today to decreases symptoms of anxiety and increase knowledge and/or ability of: coping skills. Over the next 90 days, patient will demonstrate improved adherence to prescribed treatment plan for post traumatic stress disorder as evidenced by active participation in therapy   Interventions:   Continued to assess patient's understanding, education, previous treatment and care coordination needs  Follow up on status of patient's rent, confirmed that patient's landlord Elease Hashimoto was contacted and patient has received a 30 day notice for eviction, she would be able to file an appeal It was confirmed by [employer] that patient has the right  to file an appeal, however patient has not filed an appeal or contacted legal Aid for assistance-filing the appeal highly encouraged as well as contacting Legal Aid for additional support Emotional support provided, Solution-Focused Strategies employed Placement in an Assisted Living discussed, patient willing to consider this option as a Plan B Confirmed that patient's family has been made aware of 30 day notice for eviction Other interventions include: Solution-Focused Strategies employed:  Active listening / Reflection utilized  Emotional Support Provided  Update 08/26/21 Phone call from patient stating that she has not filed the appeal with regards to her eviction. Per patient, she will need to go to the Housing Authority to file this.  Patient urged to do so as soon as possible to discuss her options Update 08/29/21 Return phone call from patient, discussed options to address her eviction. She continues to confirm that she has not contacted the Housing Authority to file an appeal regarding the eviction notice received.  She verbalizes plan to contact an attorney. This Child psychotherapist strongly urged patient to file the appeal in addition to contacting an attorney. Patient verbalized understanding of her options. Patient to contact her providers office with any care management/coordination needs int the future.  Patient Self Care Activities & Deficits:  Patient is unable to independently navigate community resource options without care coordination support Patient is able to implement clinical interventions discussed today and is motivated for treatment  Patient will select one of the agencies from the list provided and call to schedule an appointment  Motivation for treatment  Please see past updates related to this goal by clicking on the "Past Updates" button in the selected goal        Follow Up Plan: Client will contact her providers office with any additional care coordination needs  Verna Czech, LCSW Clinical Social Worker  [facility] Care Management [phone]

## 2021-08-30 NOTE — Patient Instructions (Signed)
Visit Information  Thank you for taking time to visit with me today. Please don't hesitate to contact me if I can be of assistance to you before our next scheduled telephone appointment.  Following are the goals we discussed today:   - begin personal counseling - learn and use visualization or guided imagery - practice relaxation or meditation daily - talk about feelings with a friend, family or spiritual advisor - practice positive thinking and self-talk  -contact legal aid to assist with housing [phone] -contact [employer] to confirm balance due for rent and to file an appeal regarding eviction  If you are experiencing a Mental Health or Behavioral Health Crisis or need someone to talk to, please call the Suicide and Crisis Lifeline: 988   Patient verbalizes understanding of instructions and care plan provided today and agrees to view in MyChart. Active MyChart status and patient understanding of how to access instructions and care plan via MyChart confirmed with patient.     No further follow up required: patient to contact her providers office with any additional community resource needs  Verna Czech, LCSW Clinical Social Worker  [facility] Care Management [phone]

## 2021-09-02 NOTE — Progress Notes (Unsigned)
New Patient Office Visit  Subjective:  Patient ID: Dawn [name], female    DOB: Sep 04, 1940  Age: 81 y.o. MRN: [id number]  CC:  No chief complaint on file.   HPI Dawn [name] presents for follow up. She was recently seen in the hospital for GI bleed, was seen here and then went back to the ER for same issue. Ended up waiting for 4 hours without being seen. States that since then, her abdominal pain has completely resolved. She denies nausea, vomiting, constipation/diarrhea, bloody stools or dark colored stools. Hemoglobin low from 06/03/21 11.6, lipase negative, LFTs mildly elevated with AST 44 and ALT 58, alk phos 225. She is on Protonix 40 mg daily and will see the GI next week.   Hypertension/CHF: -Medications: None, was on a beta blocker previously for about 1 year but ran out of it. No side effects. 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 Also chronic fatigue and numbness in her feet.   -Decreased sensation with monofilament L>R -TSH, vitamin B12/folate negative, A1c showing newly diabetic   Diabetes, Type 2: -Last A1c 6.7% 6/23 -Medications: Nothing yet  -Checking BG at home: *** -Fasting home BG: *** -Post-prandial home BG: *** -Highest home BG since last visit: *** -Lowest home BG since last visit: *** -Diet: *** -Exercise: *** -Eye exam: *** -Foot exam: Decreased sensation 6/23 -Microalbumin: *** -Statin: *** -PNA vaccine: *** -Denies symptoms of hypoglycemia, polyuria, polydipsia, numbness extremities, foot ulcers/trauma. ***  MDD: -Mood status: better -Current treatment: Effexor XR 150 in the am and 75 XR in the pm -Satisfied with current  treatment?: yes -Symptom severity: moderate -Duration of current treatment : years - had been on Effexor 225 for years, decreased in the hospital -Side effects: no Medication compliance: excellent compliance Psychotherapy/counseling: no in the past, interested in starting therapy again  Depressed mood: yes Anxious mood: yes     07/22/2021    9:21 AM 07/01/2021    2:15 PM 06/03/2021    9:20 AM  Depression screen PHQ 2/9  Decreased Interest 0 0 0  Down, Depressed, Hopeless 0 0 2  PHQ - 2 Score 0 0 2  Altered sleeping  0 3  Tired, decreased energy  0 3  Change in appetite  0 0  Feeling bad or failure about yourself   0 1  Trouble concentrating  0 3  Moving slowly or fidgety/restless  0 0  Suicidal thoughts  0 0  PHQ-9 Score  0 12  Difficult doing work/chores  Not difficult at all Somewhat difficult   Health Maintenance: -Blood work UTD -Mammogram/DEXA not interested in screening at this time  Past Medical History:  Diagnosis Date   CHF (congestive heart failure) (HCC)    Colitis, ischemic (HCC)    Fibromyalgia    Hypertension    Leaky heart valve    Lupus (HCC)    Osteoarthritis    PTSD (post-traumatic stress disorder)    Rheumatoid arthritis ([place])     Past Surgical History:  Procedure Laterality Date   TONSILLECTOMY      Family History  Problem Relation Age of Onset   Cancer Mother    Alzheimer's disease Mother    Cancer Father  prostate   ADD / ADHD Son     Social History   Socioeconomic History   Marital status: Married    Spouse name: Not on file   Number of children: Not on file   Years of education: Not on file   Highest education level: Not on file  Occupational History   Not on file  Tobacco Use   Smoking status: Never   Smokeless tobacco: Not on file  Vaping Use   Vaping Use: Never used  Substance and Sexual Activity   Alcohol use: No   Drug use: Not Currently   Sexual activity: Not Currently  Other Topics Concern   Not on file   Social History Narrative   Not on file   Social Determinants of Health   Financial Resource Strain: Not on file  Food Insecurity: No Food Insecurity (07/22/2021)   Hunger Vital Sign    Worried About Running Out of Food in the Last Year: Never true    Ran Out of Food in the Last Year: Never true  Transportation Needs: No Transportation Needs (07/22/2021)   PRAPARE - Hydrologist (Medical): No    Lack of Transportation (Non-Medical): No  Physical Activity: Not on file  Stress: Stress Concern Present (07/22/2021)   [place]    Feeling of Stress : To some extent  Social Connections: Socially Isolated (07/22/2021)   Social Connection and Isolation Panel [NHANES]    Frequency of Communication with Friends and Family: Once a week    Frequency of Social Gatherings with Friends and Family: Once a week    Attends Religious Services: Never    Marine scientist or Organizations: No    Attends Archivist Meetings: Never    Marital Status: Divorced  Human resources officer Violence: Not on file    ROS Review of Systems  Constitutional:  Positive for fatigue. Negative for chills and fever.  Eyes:  Negative for visual disturbance.  Respiratory:  Negative for cough and shortness of breath.   Cardiovascular:  Negative for chest pain, palpitations and leg swelling.  Gastrointestinal:  Negative for abdominal pain, blood in stool, constipation, diarrhea, nausea and vomiting.  Genitourinary:  Negative for dysuria and hematuria.  Musculoskeletal:  Positive for arthralgias.  Neurological:  Positive for numbness. Negative for dizziness and headaches.    Objective:   Today's Vitals: There were no vitals taken for this visit.  Physical Exam Constitutional:      Appearance: Normal appearance.  HENT:     Head: Normocephalic and atraumatic.  Eyes:     Conjunctiva/sclera: Conjunctivae normal.   Cardiovascular:     Rate and Rhythm: Normal rate and regular rhythm.     Pulses:          Dorsalis pedis pulses are 2+ on the right side and 2+ on the left side.  Pulmonary:     Effort: Pulmonary effort is normal.     Breath sounds: Normal breath sounds.  Abdominal:     General: There is no distension.     Palpations: Abdomen is soft.     Tenderness: There is no abdominal tenderness. There is no guarding or rebound.     Comments: RUQ and LUQ pain to palpation   Musculoskeletal:     Right lower leg: No edema.     Left lower leg: No edema.     Right foot: Normal range of motion. No deformity, bunion,  Charcot foot, foot drop or prominent metatarsal heads.     Left foot: Normal range of motion. No deformity, bunion, Charcot foot, foot drop or prominent metatarsal heads.  Feet:     Right foot:     Protective Sensation: 6 sites tested.  3 sites sensed.     Skin integrity: Skin integrity normal.     Toenail Condition: Right toenails are normal.     Left foot:     Protective Sensation: 6 sites tested.  2 sites sensed.     Skin integrity: Skin integrity normal.     Toenail Condition: Left toenails are normal.  Skin:    General: Skin is warm and dry.  Neurological:     General: No focal deficit present.     Mental Status: She is alert. Mental status is at baseline.  Psychiatric:        Mood and Affect: Mood normal.        Behavior: Behavior normal.     Assessment & Plan:   1. Pain in joint, multiple sites: Questionable diagnosis in the past of autoimmune disease like RA, fibromyalgia? Unable to verify, still complaining of pain in multiple joints and now with numbness in both feet. Start autoimmune work up including ANA, inflammatory markers, RF. Follow up in 1 month for recheck.   - Antinuclear Antib (ANA) - C-reactive protein - Sed Rate (ESR) - Rheumatoid Factor  2. Numbness and tingling of both feet: Decreased sensation on exam to monofilament, L>R. Labs as above with  additional TSH, A1c, Vitamin B12, Vitamin D.   - B12 and Folate Panel - TSH - Vitamin D (25 hydroxy) - COMPLETE METABOLIC PANEL WITH GFR - HgB A1c  3. Anemia, unspecified type: Low last month after GI bleed, recheck today. Abdominal symptoms have resolved, still on Protonix 40 mg daily. Planning to see GI next week.   - CBC w/Diff/Platelet  4. PTSD (post-traumatic stress disorder)/Anxiety: Doing well with change of Effexor dose. Continue 150 mg in the morning and 75 mg at night. Referral placed to social work to help with mental help resources and counseling services.   - Vitamin D (25 hydroxy) - AMB Referral to [place]  5. Encounter for vitamin deficiency screening  - Vitamin D (25 hydroxy)  6. Ambulatory dysfunction: Walks with a cane outside of her house but unsteady on her feet due to arthralgias, referral to home health for home aid and PT placed today.   - Ambulatory referral to Home Health  7. Lipid screening: Lipid screening today, patient is fasting.  - Lipid Profile  8. Vaccine for streptococcus pneumoniae and influenza: Pneumonia vaccine administered today.   - Pneumococcal conjugate vaccine 20-valent (Prevnar 20)  Outpatient Encounter Medications as of 09/03/2021  Medication Sig   acetaminophen (TYLENOL) 650 MG CR tablet Take 650 mg by mouth every 8 (eight) hours as needed for pain.   pantoprazole (PROTONIX) 40 MG tablet Take 1 tablet (40 mg total) by mouth 2 (two) times daily.   venlafaxine XR (EFFEXOR XR) 150 MG 24 hr capsule Take 1 capsule (150 mg total) by mouth daily with breakfast. Brand name please per patient's wishes.   venlafaxine XR (EFFEXOR XR) 75 MG 24 hr capsule Take 1 capsule (75 mg total) by mouth daily with breakfast. Brand name please per patient's wishes.   No facility-administered encounter medications on file as of 09/03/2021.    Follow-up: No follow-ups on file.   Teodora Medici, DO

## 2021-09-03 ENCOUNTER — Telehealth: Payer: Self-pay

## 2021-09-03 ENCOUNTER — Encounter: Payer: Self-pay | Admitting: Internal Medicine

## 2021-09-03 ENCOUNTER — Telehealth (INDEPENDENT_AMBULATORY_CARE_PROVIDER_SITE_OTHER): Payer: Medicare Other | Admitting: Internal Medicine

## 2021-09-03 ENCOUNTER — Ambulatory Visit: Payer: Self-pay | Admitting: *Deleted

## 2021-09-03 DIAGNOSIS — F331 Major depressive disorder, recurrent, moderate: Secondary | ICD-10-CM

## 2021-09-03 DIAGNOSIS — F4310 Post-traumatic stress disorder, unspecified: Secondary | ICD-10-CM

## 2021-09-03 DIAGNOSIS — Z59819 Housing instability, housed unspecified: Secondary | ICD-10-CM

## 2021-09-03 DIAGNOSIS — Z8719 Personal history of other diseases of the digestive system: Secondary | ICD-10-CM

## 2021-09-03 DIAGNOSIS — E1140 Type 2 diabetes mellitus with diabetic neuropathy, unspecified: Secondary | ICD-10-CM | POA: Diagnosis not present

## 2021-09-03 MED ORDER — ARIPIPRAZOLE 2 MG PO TABS: 2.0000 mg | ORAL_TABLET | Freq: Every day | ORAL | 1 refills | Status: DC

## 2021-09-03 MED ORDER — PANTOPRAZOLE SODIUM 40 MG PO TBEC: 40.0000 mg | DELAYED_RELEASE_TABLET | Freq: Every day | ORAL | 1 refills | Status: DC

## 2021-09-03 MED ORDER — METFORMIN HCL 500 MG PO TABS: 500.0000 mg | ORAL_TABLET | Freq: Two times a day (BID) | ORAL | 3 refills | Status: DC

## 2021-09-03 NOTE — Patient Instructions (Addendum)
Visit Information  Thank you for taking time to visit with me today. Please don't hesitate to contact me if I can be of assistance to you before our next scheduled telephone appointment.  Following are the goals we discussed today:  patient to consider [facility] or Assisted Living for housing options --contact legal aid to assist with housing rights and advocacy [phone]  Our next appointment is by telephone on 09/04/21 at 2pm  Please call the care guide team at [phone] if you need to cancel or reschedule your appointment.   If you are experiencing a Mental Health or Behavioral Health Crisis or need someone to talk to, please call the Suicide and Crisis Lifeline: 988   Patient verbalizes understanding of instructions and care plan provided today and agrees to view in MyChart. Active MyChart status and patient understanding of how to access instructions and care plan via MyChart confirmed with patient.     Telephone follow up appointment with care management team member scheduled for: 09/04/21  Verna Czech, LCSW Clinical Social Worker  [facility] Care Management [phone]

## 2021-09-03 NOTE — Telephone Encounter (Signed)
   Telephone encounter was:  Successful.  09/03/2021 Name: Dawn [name] MRN: [id number] DOB: [birth year]  [name] is a 81 y.o. year old female who is a primary care patient of Margarita Mail, DO . The community resource team was consulted for assistance with  housing  Care guide performed the following interventions: Patient provided with information about care guide support team and interviewed to confirm resource needs.Patient is living in a senior complex. Manager said patients rent is late and wants her to move patient does not drive  Follow Up Plan:  Care guide will follow up with patient by phone over the next day    [facility] Guide, Embedded Care Coordination [facility], Care Management  [phone] [address] [place], [place], [place] [address] Phone: [phone] Email: [email or web address]

## 2021-09-03 NOTE — Chronic Care Management (AMB) (Signed)
Care Management Clinical Social Work Note  09/03/2021 Name: Dawn [name] MRN: [id number] DOB: February 23, 1940  Dawn [name] is a 81 y.o. year old female who is a primary care patient of Margarita Mail, DO.  The Care Management team was consulted for assistance with chronic disease management and coordination needs.  Engaged with patient by telephone for follow up visit in response to provider referral for social work chronic care management and care coordination services  Consent to Services:  [name] was given information about Care Management services today including:  Care Management services includes personalized support from designated clinical staff supervised by her physician, including individualized plan of care and coordination with other care providers 24/7 contact phone numbers for assistance for urgent and routine care needs. The patient may stop case management services at any time by phone call to the office staff.  Patient agreed to services and consent obtained.   Assessment: Review of patient past medical history, allergies, medications, and health status, including review of relevant consultants reports was performed today as part of a comprehensive evaluation and provision of chronic care management and care coordination services.  SDOH (Social Determinants of Health) assessments and interventions performed:    Advanced Directives Status: Not addressed in this encounter.  Care Plan  Allergies  Allergen Reactions   Doxycycline Nausea And Vomiting   Nsaids Other (See Comments)    GI Bleed   Penicillin G Swelling   Sulfa Antibiotics Nausea And Vomiting    Outpatient Encounter Medications as of 09/03/2021  Medication Sig   acetaminophen (TYLENOL) 650 MG CR tablet Take 650 mg by mouth every 8 (eight) hours as needed for pain.   ARIPiprazole (ABILIFY) 2 MG tablet Take 1 tablet (2 mg total) by mouth daily.   metFORMIN (GLUCOPHAGE) 500 MG tablet Take 1 tablet (500  mg total) by mouth 2 (two) times daily with a meal.   pantoprazole (PROTONIX) 40 MG tablet Take 1 tablet (40 mg total) by mouth daily.   venlafaxine XR (EFFEXOR XR) 150 MG 24 hr capsule Take 1 capsule (150 mg total) by mouth daily with breakfast. Brand name please per patient's wishes. (Patient taking differently: Take 150 mg by mouth 2 (two) times daily. Brand name please per patient's wishes.)   venlafaxine XR (EFFEXOR XR) 75 MG 24 hr capsule Take 1 capsule (75 mg total) by mouth daily with breakfast. Brand name please per patient's wishes. (Patient not taking: Reported on 09/03/2021)   No facility-administered encounter medications on file as of 09/03/2021.    Patient Active Problem List   Diagnosis Date Noted   Type 2 diabetes mellitus with diabetic neuropathy, unspecified (HCC) 07/26/2021   Ambulatory dysfunction 07/26/2021   Fibromyalgia 06/03/2021   Melena 06/01/2021   Hypertension    History of ischemic colitis 2013    Hyperglycemia    Generalized anxiety disorder 04/16/2017   Major depressive disorder 04/16/2017   Panic disorder 04/16/2017   PTSD (post-traumatic stress disorder) 04/16/2017   DDD (degenerative disc disease), lumbar 07/17/2014   Lumbar facet arthropathy 07/17/2014   Lumbar radicular pain 07/17/2014   Sacroiliac joint dysfunction 07/17/2014    Conditions to be addressed/monitored: Anxiety; Limited social support, Housing barriers, ADL IADL limitations, and Mental Health Concerns   Care Plan : General Social Work (Adult)  Updates made by [employer], Clent Jacks, LCSW since 09/03/2021 12:00 AM     Problem: CHL AMB "PATIENT-SPECIFIC PROBLEM"   Note:   CARE PLAN ENTRY (see longitudinal plan of care for  additional care plan information)  Current Barriers:  Patient having housing challenges, has received notice of eviction and will need to move by 09/11/21 Patient is experiencing symptoms of  Post Traumatic Stress Disorder which seem to be exacerbated by stress related  to her housing challenges  Patient confirmed that she did not contact the housing authority to file the appeal as previously recommended Patient confirms no available family members or friends that she can resided with    Patient needs Support, Education, and Care Coordination in order to meet housing needs ADL IADL limitations and Mental Health Concerns   Clinical Social Work Goal(s):  Over the next 90 days, patient will work with SW bi-weekly to  Patient will implement interventions discussed today to develop a pan for housing Over the next 90 days, patient will demonstrate improved adherence to prescribed treatment plan for housing by participating actively with plan for re-housing  Interventions:   Continued to assess patient's understanding, education, previous treatment and care coordination needs  Follow up on status of patient's rent, confirmed that patient's landlord Elease Hashimoto was contacted and patient has received a 30 day notice for eviction, she would be able to file an appeal It was confirmed by [place] that patient has the right  to file an appeal, however patient has not filed an appeal or contacted legal Aid for assistance-filing the appeal highly encouraged as well as contacting Legal Aid for additional support Emotional support provided, Solution-Focused Strategies employed Placement in an Assisted Living discussed, patient willing to consider this option as a Plan B Confirmed that patient's family has been made aware of 30 day notice for eviction Other interventions include: Solution-Focused Strategies employed:  Active listening / Reflection utilized  Emotional Support Provided  Update 08/26/21 Phone call from patient stating that she has not filed the appeal with regards to her eviction. Per patient, she will need to go to the Housing Authority to file this.  Patient urged to do so as soon as possible to discuss her options Update 08/29/21 Return phone call from patient,  discussed options to address her eviction. She continues to confirm that she has not contacted the Housing Authority to file an appeal regarding the eviction notice received. She verbalizes plan to contact an attorney. This Child psychotherapist strongly urged patient to file the appeal in addition to contacting an attorney. Patient verbalized understanding of her options. Patient to contact her providers office with any care management/coordination needs int the future. Update 09/03/21 Message received from [facility] stating that patient was being evicted next week and that patient was unaware that her rent had not been paid.  Phone call to patient who confirmed that she had not filed the appeal as suggested and considers to discuss contacting a attorney.  Patient is not sure at this point what her plans will be for re-housing. This Child psychotherapist reviewed patient's options including [facility] and Assisted Living. Collaboration phone call to [employer], however patient will need to call personally to discuss her situation and possible housing there. Patient  will consider Assisted Living as well. This Child psychotherapist confirmed with [facility] and patient's provider that patient was well aware of her upcoming eviction and did not take the necessary steps to file the appeal as recommended. Patient's provider agreeable the Assisted Living would be the recommended level of care if patient is agreeable. Patient is open to considering Shelter or Assisted Living and will call this social worker back with her decision.  Patient provided  to the number for [facility] to call to complete the intake-[phone].  Patient Self Care Activities & Deficits:  Patient is unable to independently navigate community resource options without care coordination support Patient is able to implement clinical interventions discussed today and is motivated for treatment  Patient will select one of the agencies from the list  provided and call to schedule an appointment  Motivation for treatment  Please see past updates related to this goal by clicking on the "Past Updates" button in the selected goal        Follow Up Plan: SW will follow up with patient by phone over the next 7 business days  Verna Czech, LCSW Clinical Social Worker  [facility] Care Management [phone]

## 2021-09-03 NOTE — Telephone Encounter (Signed)
Mailing resources   [facility] Pink[facility] Care Guide, Embedded Care Coordination [place], Care Management  [phone] [address], [place], [place] [address] Phone: [phone] Email: [email or web address]     

## 2021-09-04 ENCOUNTER — Telehealth: Payer: Self-pay | Admitting: *Deleted

## 2021-09-04 NOTE — Telephone Encounter (Signed)
  Care Management   Follow Up Note   09/04/2021 Name: Dawn [name] MRN: [id number] DOB: September 02, 1940   Referred by: Margarita Mail, DO Reason for referral : Care Coordination   An unsuccessful telephone outreach was attempted today. The patient was referred to the case management team for assistance with care management and care coordination.   Follow Up Plan: Telephone follow up appointment with care management team member to be scheduled within 7-10 business days.  Verna Czech, LCSW [facility] Care Management [phone]

## 2021-09-09 ENCOUNTER — Ambulatory Visit: Payer: 59 | Admitting: Internal Medicine

## 2021-09-09 ENCOUNTER — Telehealth: Payer: Medicare Other

## 2021-09-09 ENCOUNTER — Telehealth: Payer: Self-pay | Admitting: *Deleted

## 2021-09-09 NOTE — Telephone Encounter (Signed)
  Care Management   Follow Up Note   09/09/2021 Name: Dawn [name] MRN: [id number] DOB: [birth year]   Referred by: Margarita Mail, DO Reason for referral : Care Coordination   A second unsuccessful telephone outreach was attempted today. The patient was referred to the case management team for assistance with care management and care coordination.   Follow Up Plan: Telephone follow up appointment with care management team member scheduled for: 09/11/21  Verna Czech, LCSW Clinical Social Worker  [facility] Care Management [phone]

## 2021-09-11 ENCOUNTER — Ambulatory Visit: Payer: Self-pay | Admitting: *Deleted

## 2021-09-11 DIAGNOSIS — F331 Major depressive disorder, recurrent, moderate: Secondary | ICD-10-CM

## 2021-09-11 DIAGNOSIS — Z59819 Housing instability, housed unspecified: Secondary | ICD-10-CM

## 2021-09-11 DIAGNOSIS — E1140 Type 2 diabetes mellitus with diabetic neuropathy, unspecified: Secondary | ICD-10-CM

## 2021-09-11 DIAGNOSIS — F4310 Post-traumatic stress disorder, unspecified: Secondary | ICD-10-CM

## 2021-09-11 NOTE — Chronic Care Management (AMB) (Signed)
Care Management Clinical Social Work Note  09/11/2021 Name: Dawn [name] MRN: [id number] DOB: 20-Jun-1940  Dawn [name] is a 81 y.o. year old female who is a primary care patient of Margarita Mail, DO.  The Care Management team was consulted for assistance with chronic disease management and coordination needs.  Engaged with patient by telephone for follow up visit in response to provider referral for social work chronic care management and care coordination services  Consent to Services:  [name] was given information about Care Management services today including:  Care Management services includes personalized support from designated clinical staff supervised by her physician, including individualized plan of care and coordination with other care providers 24/7 contact phone numbers for assistance for urgent and routine care needs. The patient may stop case management services at any time by phone call to the office staff.  Patient agreed to services and consent obtained.   Assessment: Review of patient past medical history, allergies, medications, and health status, including review of relevant consultants reports was performed today as part of a comprehensive evaluation and provision of chronic care management and care coordination services.  SDOH (Social Determinants of Health) assessments and interventions performed:    Advanced Directives Status: Not addressed in this encounter.  Care Plan  Allergies  Allergen Reactions   Doxycycline Nausea And Vomiting   Nsaids Other (See Comments)    GI Bleed   Penicillin G Swelling   Sulfa Antibiotics Nausea And Vomiting    Outpatient Encounter Medications as of 09/11/2021  Medication Sig   acetaminophen (TYLENOL) 650 MG CR tablet Take 650 mg by mouth every 8 (eight) hours as needed for pain.   ARIPiprazole (ABILIFY) 2 MG tablet Take 1 tablet (2 mg total) by mouth daily.   metFORMIN (GLUCOPHAGE) 500 MG tablet Take 1 tablet (500  mg total) by mouth 2 (two) times daily with a meal.   pantoprazole (PROTONIX) 40 MG tablet Take 1 tablet (40 mg total) by mouth daily.   venlafaxine XR (EFFEXOR XR) 150 MG 24 hr capsule Take 1 capsule (150 mg total) by mouth daily with breakfast. Brand name please per patient's wishes. (Patient taking differently: Take 150 mg by mouth 2 (two) times daily. Brand name please per patient's wishes.)   venlafaxine XR (EFFEXOR XR) 75 MG 24 hr capsule Take 1 capsule (75 mg total) by mouth daily with breakfast. Brand name please per patient's wishes. (Patient not taking: Reported on 09/03/2021)   No facility-administered encounter medications on file as of 09/11/2021.    Patient Active Problem List   Diagnosis Date Noted   Type 2 diabetes mellitus with diabetic neuropathy, unspecified (HCC) 07/26/2021   Ambulatory dysfunction 07/26/2021   Fibromyalgia 06/03/2021   Melena 06/01/2021   Hypertension    History of ischemic colitis 2013    Hyperglycemia    Generalized anxiety disorder 04/16/2017   Major depressive disorder 04/16/2017   Panic disorder 04/16/2017   PTSD (post-traumatic stress disorder) 04/16/2017   DDD (degenerative disc disease), lumbar 07/17/2014   Lumbar facet arthropathy 07/17/2014   Lumbar radicular pain 07/17/2014   Sacroiliac joint dysfunction 07/17/2014    Conditions to be addressed/monitored: Anxiety; Mental Health Concerns   Care Plan : General Social Work (Adult)  Updates made by Wenda Overland, LCSW since 09/11/2021 12:00 AM     Problem: CHL AMB "PATIENT-SPECIFIC PROBLEM"   Note:   CARE PLAN ENTRY (see longitudinal plan of care for additional care plan information)  Current Barriers:  Patient  having housing challenges, has received notice of eviction and will need to move by 09/11/21 Patient is experiencing symptoms of  Post Traumatic Stress Disorder which seem to be exacerbated by stress related to her housing challenges  Patient confirmed that she did not  contact the housing authority to file the appeal as previously recommended Patient confirms no available family members or friends that she can resided with    Patient needs Support, Education, and Care Coordination in order to meet housing needs ADL IADL limitations and Mental Health Concerns   Clinical Social Work Goal(s):  Over the next 90 days, patient will work with SW bi-weekly to  Patient will implement interventions discussed today to develop a pan for housing Over the next 90 days, patient will demonstrate improved adherence to prescribed treatment plan for housing by participating actively with plan for re-housing  Interventions: Update 09/11/21 Phone call from patient stating that she is now willing to enter a Assisted Living for placement. Per patient she has been to [facility] before and does not want to return. Phone call made to [facility] who states that they do have a bed available and will need to review the Fl2 once completed and interview patient before the final decision is made regarding acceptance. Fl2  to be faxed to [facility] at [phone]. Patient ha been instructed to start the Special Assistance application process by presenting to th Department of Social Services as soon as she is able.  Patient Self Care Activities & Deficits:  Patient is unable to independently navigate community resource options without care coordination support Patient is able to implement clinical interventions discussed today and is motivated for treatment  Patient will select one of the agencies from the list provided and call to schedule an appointment  Motivation for treatment  Please see past updates related to this goal by clicking on the "Past Updates" button in the selected goal        Follow Up Plan: SW will follow up with patient by phone over the next 14 business days.  Verna Czech, LCSW [facility] Care  Management [phone]

## 2021-09-11 NOTE — Patient Instructions (Signed)
Visit Information  Thank you for taking time to visit with me today. Please don't hesitate to contact me if I can be of assistance to you before our next scheduled telephone appointment.  Following are the goals we discussed today:   - patient to consider [facility] or Assisted Living for housing options --contact legal aid to assist with housing rights and advocacy [phone] -Please contact the Department of Social Services to start the process for Special Assistance Medicaid for Assisted Living Placement    Our next appointment is by telephone on 09/23/21 at 10am  Please call the care guide team at [phone] if you need to cancel or reschedule your appointment.   If you are experiencing a Mental Health or Behavioral Health Crisis or need someone to talk to, please call the Suicide and Crisis Lifeline: 988   Patient verbalizes understanding of instructions and care plan provided today and agrees to view in MyChart. Active MyChart status and patient understanding of how to access instructions and care plan via MyChart confirmed with patient.     Telephone follow up appointment with care management team member scheduled for: 09/23/21  Verna Czech, LCSW Clinical Social Worker  [facility] Care Management [phone]

## 2021-09-18 ENCOUNTER — Telehealth: Payer: Self-pay | Admitting: Internal Medicine

## 2021-09-18 NOTE — Telephone Encounter (Signed)
Copied from CRM [id number]. Topic: Referral - Status >> Sep 18, 2021 12:27 PM Lyman Speller wrote: Reason for CRM:  Asher Muir from [facility] care called about referral they received for the pt/ they have been trying to reach the pt with no success and have left message but cant get a returned call / please advise

## 2021-09-18 NOTE — Telephone Encounter (Signed)
Patient states after she picks up her ARIPiprazole (ABILIFY) 2 MG tablet refill she would like PCP to increase the dosage because its helping with her PTSD  [facility] [address] [place], [place] - [address] Phone:  [phone]  Fax:  [phone]

## 2021-09-19 ENCOUNTER — Other Ambulatory Visit: Payer: Self-pay | Admitting: Internal Medicine

## 2021-09-19 DIAGNOSIS — F4310 Post-traumatic stress disorder, unspecified: Secondary | ICD-10-CM

## 2021-09-19 NOTE — Telephone Encounter (Signed)
Lvm for pt to call back and schedule an appt 

## 2021-09-19 NOTE — Telephone Encounter (Signed)
Per Dr. Caralee Ates she needs f/u appt for medication adjustment

## 2021-09-19 NOTE — Telephone Encounter (Signed)
Requested Prescriptions  Pending Prescriptions Disp Refills  . venlafaxine XR (EFFEXOR-XR) 150 MG 24 hr capsule [Pharmacy Med Name: Venlafaxine HCl ER 150 MG Oral Capsule Extended Release 24 Hour] 90 capsule 0    Sig: TAKE 1 CAPSULE BY MOUTH IN THE MORNING WITH BREAKFAST     Psychiatry: Antidepressants - SNRI - desvenlafaxine & venlafaxine Failed - 09/19/2021 12:11 AM      Failed - Cr in normal range and within 360 days    Creat  Date Value Ref Range Status  07/01/2021 0.99 (H) 0.60 - 0.95 mg/dL Final         Failed - Lipid Panel in normal range within the last 12 months    Cholesterol  Date Value Ref Range Status  07/01/2021 249 (H) <200 mg/dL Final   LDL Cholesterol (Calc)  Date Value Ref Range Status  07/01/2021 145 (H) mg/dL (calc) Final    Comment:    Reference range: <100 . Desirable range <100 mg/dL for primary prevention;   <70 mg/dL for patients with CHD or diabetic patients  with > or = 2 CHD risk factors. Marland Kitchen LDL-C is now calculated using the Martin-Hopkins  calculation, which is a validated novel method providing  better accuracy than the Friedewald equation in the  estimation of LDL-C.  Horald Pollen et al. Lenox Ahr. 0272;536(64): 2061-2068  (http://education.[employer].com/faq/FAQ164)    HDL  Date Value Ref Range Status  07/01/2021 71 > OR = 50 mg/dL Final   Triglycerides  Date Value Ref Range Status  07/01/2021 193 (H) <150 mg/dL Final         Passed - Completed PHQ-2 or PHQ-9 in the last 360 days      Passed - Last BP in normal range    BP Readings from Last 1 Encounters:  07/01/21 136/84         Passed - Valid encounter within last 6 months    Recent Outpatient Visits          2 weeks ago PTSD (post-traumatic stress disorder)   [facility] Margarita Mail, DO   2 months ago Pain in joint, multiple sites   [facility] Margarita Mail, DO   3 months ago Hospital discharge follow-up   [facility] Margarita Mail, [place]

## 2021-09-23 ENCOUNTER — Ambulatory Visit: Payer: Medicare Other | Admitting: *Deleted

## 2021-09-23 ENCOUNTER — Telehealth: Payer: Self-pay | Admitting: Internal Medicine

## 2021-09-23 ENCOUNTER — Ambulatory Visit: Payer: Self-pay | Admitting: *Deleted

## 2021-09-23 ENCOUNTER — Ambulatory Visit: Payer: Medicaid Other | Admitting: Internal Medicine

## 2021-09-23 DIAGNOSIS — F331 Major depressive disorder, recurrent, moderate: Secondary | ICD-10-CM

## 2021-09-23 DIAGNOSIS — F4310 Post-traumatic stress disorder, unspecified: Secondary | ICD-10-CM

## 2021-09-23 DIAGNOSIS — Z59819 Housing instability, housed unspecified: Secondary | ICD-10-CM

## 2021-09-23 NOTE — Patient Instructions (Signed)
Visit Information  Thank you for taking time to visit with me today. Please don't hesitate to contact me if I can be of assistance to you before our next scheduled telephone appointment.   --contact legal aid to assist with housing rights and advocacy [phone] as well as the Department of Social Services if needed in the future -Please contact [facility] to schedule the in home care assessment [phone] -ollowing are the goals we discussed today:     Please call the care guide team at [phone] if you need to cancel or reschedule your appointment.   If you are experiencing a Mental Health or Behavioral Health Crisis or need someone to talk to, please call the Suicide and Crisis Lifeline: 988   Patient verbalizes understanding of instructions and care plan provided today and agrees to view in MyChart. Active MyChart status and patient understanding of how to access instructions and care plan via MyChart confirmed with patient.     No further follow up required: Patient will remain in her current living environment with the assistance of the Department of Social Services Patient to contact [facility] to schedule the in home assessment for an aid [phone]    Verna Czech, LCSW Clinical Social Worker  [facility] Care Management [phone]

## 2021-09-23 NOTE — Progress Notes (Deleted)
Established Patient Office Visit  Subjective   Patient ID: Dawn [name], female    DOB: 14-Apr-1940  Age: 81 y.o. MRN: [id number]  No chief complaint on file.   HPI  Dawn [name] is here for follow up today. She states that she is very upset because she recently received an eviction notice that is to be executed on 09/12/2021, which is next week.  Patient states she was told by her apartment manager that she is behind on her rent.  The patient states she did not know about this and has no ability to pay it.  She also states that people are breaking into her apartment when she is not there and stealing things like her silverware and towels.  She also states that her apartment is being vandalized and she had a lazy boy chair that was broken.  She has not called the police about this.  She lives by herself.  She does have an adult son who lives in [place], she states she asked if she could stay with him and he said no.  The patient has a history of PTSD and anxiety and is currently being treated for major depressive disorder.  MDD: -Mood status: exacerbated  -Current treatment: Effexor XR 150 in the am and 75 XR in the pm, Abilify 2 mg added at last telemedicine appointment 1 month ago  -Satisfied with current treatment?: yes -Symptom severity: moderate -Duration of current treatment : years - had been on Effexor 225 for years, decreased in the hospital -Side effects: no Medication compliance: excellent compliance Psychotherapy/counseling: no in the past, has spoken to social worker about it   Depressed mood: yes Anxious mood: yes Denies visual or auditory hallucinations Denies paranoid or delusional thinking Referral to Psychiatry also placed at last visit      09/03/2021   10:05 AM 07/22/2021    9:21 AM 07/01/2021    2:15 PM 06/03/2021    9:20 AM  Depression screen PHQ 2/9  Decreased Interest 0 0 0 0  Down, Depressed, Hopeless 3 0 0 2  PHQ - 2 Score 3 0 0 2  Altered sleeping 3  0 3   Tired, decreased energy 3  0 3  Change in appetite 2  0 0  Feeling bad or failure about yourself  3  0 1  Trouble concentrating 3  0 3  Moving slowly or fidgety/restless 0  0 0  Suicidal thoughts 0  0 0  PHQ-9 Score 17  0 12  Difficult doing work/chores Very difficult  Not difficult at all Somewhat difficult    Hypertension/CHF: -Medications: None, was on a beta blocker previously for about 1 year but ran out of it. No side effects. 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 Also chronic fatigue and numbness in her feet.   -Decreased sensation with monofilament L>R -TSH, vitamin B12/folate negative, A1c showing newly diabetic   Diabetes, Type 2: -Last A1c 6.7% 6/23 -Medications: Metformin 500 mg BID started last time -Foot exam: Decreased sensation 6/23 -Microalbumin: Due at follow-up -Statin: No -PNA vaccine: Up-to-date -Denies symptoms of hypoglycemia, polyuria, polydipsia, foot ulcers/trauma.   Health Maintenance: -Blood work UTD -Mammogram/DEXA not interested in screening at  this time    {History (Optional):23778}  ROS    Objective:     There were no vitals taken for this visit. {Vitals History (Optional):23777}  Physical Exam   No results found for any visits on 09/23/21.  {Labs (Optional):23779}  The ASCVD Risk score (Arnett DK, et al., 2019) failed to calculate for the following reasons:   The 2019 ASCVD risk score is only valid for ages 31 to 86    Assessment & Plan:   Problem List Items Addressed This Visit   None   No follow-ups on file.    Margarita Mail, DO

## 2021-09-23 NOTE — Telephone Encounter (Signed)
Pt called in with anxiety attack per pt. She hung up before transferred to me, I called back and there was no answer. Voicemail left with number to reach [place].

## 2021-09-23 NOTE — Chronic Care Management (AMB) (Signed)
Care Management Clinical Social Work Note  09/23/2021 Name: Dawn [name] MRN: [id number] DOB: [birth year]  Dawn [name] is a 81 y.o. year old female who is a primary care patient of Margarita Mail, DO.  The Care Management team was consulted for assistance with chronic disease management and coordination needs.  Engaged with patient by telephone for follow up visit in response to provider referral for social work chronic care management and care coordination services  Consent to Services:  Dawn [name] was given information about Care Management services today including:  Care Management services includes personalized support from designated clinical staff supervised by her physician, including individualized plan of care and coordination with other care providers 24/7 contact phone numbers for assistance for urgent and routine care needs. The patient may stop case management services at any time by phone call to the office staff.  Patient agreed to services and consent obtained.   Assessment: Review of patient past medical history, allergies, medications, and health status, including review of relevant consultants reports was performed today as part of a comprehensive evaluation and provision of chronic care management and care coordination services.  SDOH (Social Determinants of Health) assessments and interventions performed:    Advanced Directives Status: Not addressed in this encounter.  Care Plan  Allergies  Allergen Reactions   Doxycycline Nausea And Vomiting   Nsaids Other (See Comments)    GI Bleed   Penicillin G Swelling   Sulfa Antibiotics Nausea And Vomiting    Outpatient Encounter Medications as of 09/23/2021  Medication Sig   acetaminophen (TYLENOL) 650 MG CR tablet Take 650 mg by mouth every 8 (eight) hours as needed for pain.   ARIPiprazole (ABILIFY) 2 MG tablet Take 1 tablet (2 mg total) by mouth daily.   metFORMIN (GLUCOPHAGE) 500 MG tablet Take 1 tablet (500  mg total) by mouth 2 (two) times daily with a meal.   pantoprazole (PROTONIX) 40 MG tablet Take 1 tablet (40 mg total) by mouth daily.   venlafaxine XR (EFFEXOR XR) 75 MG 24 hr capsule Take 1 capsule (75 mg total) by mouth daily with breakfast. Brand name please per patient's wishes. (Patient not taking: Reported on 09/03/2021)   venlafaxine XR (EFFEXOR-XR) 150 MG 24 hr capsule TAKE 1 CAPSULE BY MOUTH IN THE MORNING WITH BREAKFAST   No facility-administered encounter medications on file as of 09/23/2021.    Patient Active Problem List   Diagnosis Date Noted   Type 2 diabetes mellitus with diabetic neuropathy, unspecified (HCC) 07/26/2021   Ambulatory dysfunction 07/26/2021   Fibromyalgia 06/03/2021   Melena 06/01/2021   Hypertension    History of ischemic colitis 2013    Hyperglycemia    Generalized anxiety disorder 04/16/2017   Major depressive disorder 04/16/2017   Panic disorder 04/16/2017   PTSD (post-traumatic stress disorder) 04/16/2017   DDD (degenerative disc disease), lumbar 07/17/2014   Lumbar facet arthropathy 07/17/2014   Lumbar radicular pain 07/17/2014   Sacroiliac joint dysfunction 07/17/2014    Conditions to be addressed/monitored: Anxiety; Mental Health Concerns   Care Plan : General Social Work (Adult)  Updates made by Wenda Overland, LCSW since 09/23/2021 12:00 AM     Problem: [facility] AMB "PATIENT-SPECIFIC PROBLEM" Resolved 09/23/2021  Note:   CARE PLAN ENTRY (see longitudinal plan of care for additional care plan information)  Current Barriers:  Patient having housing challenges, has received notice of eviction and will need to move by 09/11/21(resolved)  Patient confirmed that she did not contact the housing  authority to file the appeal as previously recommended, however she did contact the Department of Social Services for assistance with her current living situation   Patient needs Support, Education, and Care Coordination in order to meet housing needs ADL IADL  limitations and Mental Health Concerns   Clinical Social Work Goal(s):  Over the next 90 days, patient will work with SW bi-weekly to  Patient will implement interventions discussed today to develop a plan for housing Over the next 90 days, patient will demonstrate improved adherence to prescribed treatment plan for housing by participating actively with plan for re-housing  Interventions: Update 09/23/21 Follow up phone call to patient regarding her current housing situation. Patient confirmed that she remains in her current apartment stating that she contacted the department of Social Services and they assisted her with her eviction. Per patient, he rent is now current and she  is notwilling to enter a Assisted Living for placement at this time. Patient remains interested in obtaining a aid, [facility] has been trying to reach patient to schedule an assessment. Contact information provided to patient to call and schedule. Patient agreeable to calling [facility] today [phone]. Patient confirms having no additional community resource needs at this time.  Patient Self Care Activities & Deficits:  Patient is unable to independently navigate community resource options without care coordination support Patient is able to implement clinical interventions discussed today and is motivated for treatment  Patient will select one of the agencies from the list provided and call to schedule an appointment  Motivation for treatment  Please see past updates related to this goal by clicking on the "Past Updates" button in the selected goal        Follow Up Plan: Client will contact [facility] to schedule in home assessment for an aid   Verna Czech, LCSW Clinical Social Worker  [facility] Care Management [phone]

## 2021-09-23 NOTE — Patient Outreach (Signed)
  Care Coordination   09/23/2021 Name: Dawn [name] MRN: [id number] DOB: 08-18-1940   Care Coordination Outreach Attempts:  An unsuccessful telephone outreach was attempted today to offer the patient information about available care coordination services as a benefit of their health plan.   Follow Up Plan:  Additional outreach attempts will be made to offer the patient care coordination information and services.   Encounter Outcome:  No Answer  Care Coordination Interventions Activated:  No   Care Coordination Interventions:  No, not indicated    [facility] Care Management [phone]

## 2021-09-24 ENCOUNTER — Ambulatory Visit: Payer: Self-pay | Admitting: *Deleted

## 2021-09-24 NOTE — Progress Notes (Deleted)
Established Patient Office Visit  Subjective   Patient ID: Dawn [name], female    DOB: [birth year]  Age: 81 y.o. MRN: [id number]  No chief complaint on file.   HPI  Dawn [name] is here for follow up today. She states that she is very upset because she recently received an eviction notice that is to be executed on 09/12/2021, which is next week.  Patient states she was told by her apartment manager that she is behind on her rent.  The patient states she did not know about this and has no ability to pay it.  She also states that people are breaking into her apartment when she is not there and stealing things like her silverware and towels.  She also states that her apartment is being vandalized and she had a lazy boy chair that was broken.  She has not called the police about this.  She lives by herself.  She does have an adult son who lives in [place], she states she asked if she could stay with him and he said no.  The patient has a history of PTSD and anxiety and is currently being treated for major depressive disorder.  MDD: -Mood status: exacerbated  -Current treatment: Effexor XR 150 in the am and 75 XR in the pm, Abilify 2 mg added at last telemedicine appointment 1 month ago  -Satisfied with current treatment?: yes -Symptom severity: moderate -Duration of current treatment : years - had been on Effexor 225 for years, decreased in the hospital -Side effects: no Medication compliance: excellent compliance Psychotherapy/counseling: no in the past, has spoken to social worker about it   Depressed mood: yes Anxious mood: yes Denies visual or auditory hallucinations Denies paranoid or delusional thinking Referral to Psychiatry also placed at last visit      09/03/2021   10:05 AM 07/22/2021    9:21 AM 07/01/2021    2:15 PM 06/03/2021    9:20 AM  Depression screen PHQ 2/9  Decreased Interest 0 0 0 0  Down, Depressed, Hopeless 3 0 0 2  PHQ - 2 Score 3 0 0 2  Altered sleeping 3  0 3   Tired, decreased energy 3  0 3  Change in appetite 2  0 0  Feeling bad or failure about yourself  3  0 1  Trouble concentrating 3  0 3  Moving slowly or fidgety/restless 0  0 0  Suicidal thoughts 0  0 0  PHQ-9 Score 17  0 12  Difficult doing work/chores Very difficult  Not difficult at all Somewhat difficult    Hypertension/CHF: -Medications: None, was on a beta blocker previously for about 1 year but ran out of it. No side effects. 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 Also chronic fatigue and numbness in her feet.   -Decreased sensation with monofilament L>R -TSH, vitamin B12/folate negative, A1c showing newly diabetic   Diabetes, Type 2: -Last A1c 6.7% 6/23 -Medications: Metformin 500 mg BID started last time -Foot exam: Decreased sensation 6/23 -Microalbumin: Due at follow-up -Statin: No -PNA vaccine: Up-to-date -Denies symptoms of hypoglycemia, polyuria, polydipsia, foot ulcers/trauma.   Health Maintenance: -Blood work UTD -Mammogram/DEXA not interested in screening at  this time    {History (Optional):23778}  ROS    Objective:     There were no vitals taken for this visit. {Vitals History (Optional):23777}  Physical Exam   No results found for any visits on 09/25/21.  {Labs (Optional):23779}  The ASCVD Risk score (Arnett DK, et al., 2019) failed to calculate for the following reasons:   The 2019 ASCVD risk score is only valid for ages 4 to 7    Assessment & Plan:   Problem List Items Addressed This Visit   None   No follow-ups on file.    Margarita Mail, DO

## 2021-09-24 NOTE — Telephone Encounter (Signed)
  Chief Complaint: Stopped Effexor and now feeling shaky, lump in throat, and nausea and anxious.  "I stopped taking it but I tell everyone it's because my insurance stopped paying for it".  Symptoms:  Was on Effexor for fear related to PTSD.   Troubled childhood.   Frequency: Last Effexor was Sat. Or Sun. Pertinent Negatives: Patient denies wanting to harm herself. Disposition: [] ED /[] Urgent Care (no appt availability in office) / [x] Appointment(In office/virtual)/ []  [facility]/ [] Home Care/ [] Refused Recommended Disposition /[] [facility]/ []  Follow-up with PCP Additional Notes: Pt already had an appt. With Dr. for 09/25/2021 at 11:00 so will keep that appt. With instructions to go to the ED if her symptoms become worse which she was agreeable to doing.

## 2021-09-24 NOTE — Telephone Encounter (Signed)
Appt sch'd for the 9th

## 2021-09-24 NOTE — Telephone Encounter (Signed)
Reason for Disposition  MODERATE anxiety (e.g., persistent or frequent anxiety symptoms; interferes with sleep, school, or work)  Answer Assessment - Initial Assessment Questions 1. CONCERN: "Did anything happen that prompted you to call today?"      Calling in with anxiety.   Insurance not pay for my Effexor.    I weaned off of it.   I'm just so nervous.   I'm not scarred.   I have PTSD.   That's why I was on it.    I'm ok not to take it due to fear.   I'm using Dramamine.    Been off Effexor a day.  Last day I took it was Sat. Or Sunday.     Shaking, lump in my throat, feels like I want to vomit.   The Dramamine is helping with that.    2. ANXIETY SYMPTOMS: "Can you describe how you (your loved one; patient) have been feeling?" (e.g., tense, restless, panicky, anxious, keyed up, overwhelmed, sense of impending doom).      I'm shaky and anxious.    3. ONSET: "How long have you been feeling this way?" (e.g., hours, days, weeks)     This morning was rough but I got some Dramamine and it helped calm me down.    Having symptoms since Monday the symptoms started. 4. SEVERITY: "How would you rate the level of anxiety?" (e.g., 0 - 10; or mild, moderate, severe).     I'm not scarred as a result of the PTSD now so I'm proud of that. 5. FUNCTIONAL IMPAIRMENT: "How have these feelings affected your ability to do daily activities?" "Have you had more difficulty than usual doing your normal daily activities?" (e.g., getting better, same, worse; self-care, school, work, interactions)     A little because of the anxiousness.   6. HISTORY: "Have you felt this way before?" "Have you ever been diagnosed with an anxiety problem in the past?" (e.g., generalized anxiety disorder, panic attacks, PTSD). If Yes, ask: "How was this problem treated?" (e.g., medicines, counseling, etc.)     Yes 7. RISK OF HARM - SUICIDAL IDEATION: "Do you ever have thoughts of hurting or killing yourself?" If Yes, ask:  "Do you have these  feelings now?" "Do you have a plan on how you would do this?"     No  I'm trying to live 8. TREATMENT:  "What has been done so far to treat this anxiety?" (e.g., medicines, relaxation strategies). "What has helped?"     Dramamine  9. TREATMENT - THERAPIST: "Do you have a counselor or therapist? Name?"     Not asked 10. POTENTIAL TRIGGERS: "Do you drink caffeinated beverages (e.g., coffee, colas, teas), and how much daily?" "Do you drink alcohol or use any drugs?" "Have you started any new medicines recently?"       Stopped Effexor  11. PATIENT SUPPORT: "Who is with you now?" "Who do you live with?" "Do you have family or friends who you can talk to?"        Not asked 12. OTHER SYMPTOMS: "Do you have any other symptoms?" (e.g., feeling depressed, trouble concentrating, trouble sleeping, trouble breathing, palpitations or fast heartbeat, chest pain, sweating, nausea, or diarrhea)       Shaking, lump in throat and nausea "When I feel better I can't remember all they symptoms".    13. PREGNANCY: "Is there any chance you are pregnant?" "When was your last menstrual period?"       N/A  Protocols used:  Anxiety and Panic Attack-A-AH

## 2021-09-24 NOTE — Telephone Encounter (Signed)
Refilled 09/19/2021 #90 0 refills - confirmed by same pharmacy. Requested Prescriptions  Pending Prescriptions Disp Refills  . venlafaxine XR (EFFEXOR-XR) 150 MG 24 hr capsule [Pharmacy Med Name: Venlafaxine HCl ER 150 MG Oral Capsule Extended Release 24 Hour] 90 capsule 0    Sig: TAKE 1 CAPSULE BY MOUTH IN THE MORNING WITH BREAKFAST     Psychiatry: Antidepressants - SNRI - desvenlafaxine & venlafaxine Failed - 09/23/2021  9:41 AM      Failed - Cr in normal range and within 360 days    Creat  Date Value Ref Range Status  07/01/2021 0.99 (H) 0.60 - 0.95 mg/dL Final         Failed - Lipid Panel in normal range within the last 12 months    Cholesterol  Date Value Ref Range Status  07/01/2021 249 (H) <200 mg/dL Final   LDL Cholesterol (Calc)  Date Value Ref Range Status  07/01/2021 145 (H) mg/dL (calc) Final    Comment:    Reference range: <100 . Desirable range <100 mg/dL for primary prevention;   <70 mg/dL for patients with CHD or diabetic patients  with > or = 2 CHD risk factors. Marland Kitchen LDL-C is now calculated using the Martin-Hopkins  calculation, which is a validated novel method providing  better accuracy than the Friedewald equation in the  estimation of LDL-C.  Horald Pollen et al. Lenox Ahr. 4758;307(46): 2061-2068  (http://education.[employer].com/faq/FAQ164)    HDL  Date Value Ref Range Status  07/01/2021 71 > OR = 50 mg/dL Final   Triglycerides  Date Value Ref Range Status  07/01/2021 193 (H) <150 mg/dL Final         Passed - Completed PHQ-2 or PHQ-9 in the last 360 days      Passed - Last BP in normal range    BP Readings from Last 1 Encounters:  07/01/21 136/84         Passed - Valid encounter within last 6 months    Recent Outpatient Visits          3 weeks ago PTSD (post-traumatic stress disorder)   [facility] Margarita Mail, DO   2 months ago Pain in joint, multiple sites   [facility] Margarita Mail, DO    3 months ago Hospital discharge follow-up   [facility] Margarita Mail, DO      Future Appointments            Tomorrow Margarita Mail, DO [facility], [facility]

## 2021-09-24 NOTE — Telephone Encounter (Signed)
Please call to schedule she missed her appt the other day

## 2021-09-25 ENCOUNTER — Ambulatory Visit: Payer: Medicaid Other | Admitting: Internal Medicine

## 2021-09-26 ENCOUNTER — Ambulatory Visit: Payer: Self-pay | Admitting: *Deleted

## 2021-09-26 DIAGNOSIS — F4310 Post-traumatic stress disorder, unspecified: Secondary | ICD-10-CM

## 2021-09-26 DIAGNOSIS — I10 Essential (primary) hypertension: Secondary | ICD-10-CM

## 2021-09-26 DIAGNOSIS — F411 Generalized anxiety disorder: Secondary | ICD-10-CM

## 2021-09-26 NOTE — Chronic Care Management (AMB) (Deleted)
ccm 

## 2021-09-26 NOTE — Chronic Care Management (AMB) (Signed)
Care Management Clinical Social Work Note  09/26/2021 Name: Dawn [name] MRN: [id number] DOB: 15-Sep-1940  Dawn [name] is a 81 y.o. year old female who is a primary care patient of Margarita Mail, DO.  The Care Management team was consulted for assistance with chronic disease management and coordination needs.  Engaged with patient by telephone for follow up visit in response to provider referral for social work chronic care management and care coordination services  Consent to Services:  [name] was given information about Care Management services today including:  Care Management services includes personalized support from designated clinical staff supervised by her physician, including individualized plan of care and coordination with other care providers 24/7 contact phone numbers for assistance for urgent and routine care needs. The patient may stop case management services at any time by phone call to the office staff.  Patient agreed to services and consent obtained.   Assessment: Review of patient past medical history, allergies, medications, and health status, including review of relevant consultants reports was performed today as part of a comprehensive evaluation and provision of chronic care management and care coordination services.  SDOH (Social Determinants of Health) assessments and interventions performed:    Advanced Directives Status: Not addressed in this encounter.  Care Plan  Allergies  Allergen Reactions   Doxycycline Nausea And Vomiting   Nsaids Other (See Comments)    GI Bleed   Penicillin G Swelling   Sulfa Antibiotics Nausea And Vomiting    Outpatient Encounter Medications as of 09/26/2021  Medication Sig   acetaminophen (TYLENOL) 650 MG CR tablet Take 650 mg by mouth every 8 (eight) hours as needed for pain.   ARIPiprazole (ABILIFY) 2 MG tablet Take 1 tablet (2 mg total) by mouth daily.   metFORMIN (GLUCOPHAGE) 500 MG tablet Take 1 tablet (500  mg total) by mouth 2 (two) times daily with a meal.   pantoprazole (PROTONIX) 40 MG tablet Take 1 tablet (40 mg total) by mouth daily.   venlafaxine XR (EFFEXOR XR) 75 MG 24 hr capsule Take 1 capsule (75 mg total) by mouth daily with breakfast. Brand name please per patient's wishes. (Patient not taking: Reported on 09/03/2021)   venlafaxine XR (EFFEXOR-XR) 150 MG 24 hr capsule TAKE 1 CAPSULE BY MOUTH IN THE MORNING WITH BREAKFAST   No facility-administered encounter medications on file as of 09/26/2021.    Patient Active Problem List   Diagnosis Date Noted   Type 2 diabetes mellitus with diabetic neuropathy, unspecified (HCC) 07/26/2021   Ambulatory dysfunction 07/26/2021   Fibromyalgia 06/03/2021   Melena 06/01/2021   Hypertension    History of ischemic colitis 2013    Hyperglycemia    Generalized anxiety disorder 04/16/2017   Major depressive disorder 04/16/2017   Panic disorder 04/16/2017   PTSD (post-traumatic stress disorder) 04/16/2017   DDD (degenerative disc disease), lumbar 07/17/2014   Lumbar facet arthropathy 07/17/2014   Lumbar radicular pain 07/17/2014   Sacroiliac joint dysfunction 07/17/2014    Conditions to be addressed/monitored: Anxiety; Limited social support and Mental Health Concerns   Care Plan : General Social Work (Adult)  Updates made by Wenda Overland, LCSW since 09/26/2021 12:00 AM     Problem: CHL AMB "PATIENT-SPECIFIC PROBLEM" Resolved 09/26/2021  Note:   CARE PLAN ENTRY (see longitudinal plan of care for additional care plan information)  Current Barriers:  Patient having housing challenges, has received notice of eviction and will need to move by 09/11/21(resolved)  Patient confirmed that she did  not contact the housing authority to file the appeal as previously recommended, however she did contact the Department of Social Services for assistance with her current living situation   Patient needs Support, Education, and Care Coordination in order  to meet housing needs ADL IADL limitations and Mental Health Concerns   Clinical Social Work Goal(s):  Over the next 90 days, patient will work with SW bi-weekly to  Patient will implement interventions discussed today to develop a plan for housing Over the next 90 days, patient will demonstrate improved adherence to prescribed treatment plan for housing by participating actively with plan for re-housing  Interventions: Patient confirmed experiencing disorganized thoughts, confusion and paranoia Confirmed refusal to leave her apartment ,fearing that someone will come an take her things Patient requesting refill on Effexor, patient encouraged to contact her providers office with this request Patient confirmed unsure if she has contacted [facility] regarding in home aid care "I can't remember" Confirmed that DSS worker Santo Held is on vacation, CSW will contact the Department of Social Services to report concerns related to patients confusion and follow through Active listening / Reflection utilized  Emotional Support Provided   Patient Self Care Activities & Deficits:  Patient is unable to independently navigate community resource options without care coordination support Patient is able to implement clinical interventions discussed today and is motivated for treatment  Patient will select one of the agencies from the list provided and call to schedule an appointment  Motivation for treatment  Please see past updates related to this goal by clicking on the "Past Updates" button in the selected goal      Care Plan : General Social Work (Adult)  Updates made by [employer], Clent Jacks, LCSW since 09/26/2021 12:00 AM     Problem: CHL AMB "PATIENT-SPECIFIC PROBLEM"   Note:   Care Coordination Interventions:  Patient confirmed experiencing disorganized thoughts, confusion and paranoia Confirmed refusal to leave her apartment ,fearing that someone will come an take her  things Patient requesting refill on Effexor, patient encouraged to contact her providers office with this request Patient confirmed unsure if she has contacted [facility] regarding in home aid care "I can't remember" Confirmed that DSS worker Santo Held is on vacation, CSW will contact the Department of Social Services to report concerns related to patients confusion and follow through DSS contacted, report made with Pat Kocher who confirmed that patient has an open DSS case, confirmed that her social worker is Horticulturist, commercial Active listening / Reflection utilized  Industrial/product designer Provided       Follow Up Plan: SW will follow up with patient by phone over the next 14 business days   [employer], LCSW Clinical Social Worker  [facility] Care Management [phone]

## 2021-09-26 NOTE — Patient Instructions (Addendum)
Visit Information  Thank you for taking time to visit with me today. Please don't hesitate to contact me if I can be of assistance to you before our next scheduled telephone appointment.  Following are the goals we discussed today:  Please follow recommendations set by the Department of Social Services  Our next appointment is by telephone on 10/01/21 at 10am  Please call the care guide team at [phone] if you need to cancel or reschedule your appointment.   If you are experiencing a Mental Health or Behavioral Health Crisis or need someone to talk to, please call the Suicide and Crisis Lifeline: 988   Patient verbalizes understanding of instructions and care plan provided today and agrees to view in MyChart. Active MyChart status and patient understanding of how to access instructions and care plan via MyChart confirmed with patient.     Telephone follow up appointment with care management team member scheduled for: 10/01/21   Verna Czech, LCSW Clinical Social Worker  [facility] Care Management [phone]

## 2021-10-01 ENCOUNTER — Ambulatory Visit: Payer: Self-pay | Admitting: *Deleted

## 2021-10-01 ENCOUNTER — Telehealth: Payer: Medicaid Other | Admitting: *Deleted

## 2021-10-01 ENCOUNTER — Ambulatory Visit: Payer: Self-pay | Admitting: Licensed Clinical Social Worker

## 2021-10-01 NOTE — Patient Outreach (Signed)
  Care Coordination   10/01/2021 Name: Mercades M [place] MRN: [id number] DOB: September 16, 1940   Care Coordination Outreach Attempts:  An unsuccessful telephone outreach was attempted today to offer the patient information about available care coordination services as a benefit of their health plan.   Follow Up Plan:  Additional outreach attempts will be made to offer the patient care coordination information and services.   Encounter Outcome:  No Answer  Care Coordination Interventions Activated:  No   Care Coordination Interventions:  No, not indicated    Jenel Lucks, MSW, LCSW [facility] Care Management [facility]  [facility] [email or web address] Phone [phone] 3:20 PM

## 2021-10-01 NOTE — Patient Outreach (Signed)
  Care Coordination   10/01/2021 Name: Dawn [name] MRN: [id number] DOB: 04/09/1940   Care Coordination Outreach Attempts:  An unsuccessful telephone outreach was attempted today to offer the patient information about available care coordination services as a benefit of their health plan.   Follow Up Plan:  Additional outreach attempts will be made to offer the patient care coordination information and services.   Encounter Outcome:  No Answer  Care Coordination Interventions Activated:  No   Care Coordination Interventions:  No, not indicated    Petula Rotolo, LCSW Clinical Social Worker  [facility] Care Management [phone]

## 2021-10-02 ENCOUNTER — Ambulatory Visit: Payer: Self-pay | Admitting: *Deleted

## 2021-10-02 NOTE — Telephone Encounter (Signed)
  Chief Complaint: advise Symptoms: NA Frequency: NA Pertinent Negatives: Patient denies NA Disposition: [] ED /[] Urgent Care (no appt availability in office) / [] Appointment(In office/virtual)/ []  [place] Virtual Care/ [] Home Care/ [] Refused Recommended Disposition /[] [place] Mobile Bus/ []  Follow-up with PCP Additional Notes: Pt calling to speak to Dr. "  Agent transferred to NT. Pt states she was notified she won $275,000 in a lottery and it would go to her PayPal acct.  Does not recall how she was notified, then stated "Computer probably." States "Spun a wheel" (Unsure) Asked if she had given any money to site, states no. Advised to not give money, any account numbers, ANY information, no contact.  States son did look at notification "Said well we'll see." Advised pt to alert sheriffs dept. States she will do so but wants Dr. to know "And no one else." Reason for Disposition  Nursing judgment or information in reference  Answer Assessment - Initial Assessment Questions 1. REASON FOR CALL: "What is your main concern right now?"     Advise  Protocols used: No Guideline Available-A-AH

## 2021-10-03 ENCOUNTER — Ambulatory Visit: Payer: 59 | Admitting: Licensed Clinical Social Worker

## 2021-10-03 ENCOUNTER — Ambulatory Visit: Payer: Self-pay | Admitting: Licensed Clinical Social Worker

## 2021-10-03 NOTE — Patient Outreach (Signed)
  Care Coordination   10/03/2021 Name: Dawn [name] MRN: [id number] DOB: Dec 24, 1940   Care Coordination Outreach Attempts:  A second unsuccessful outreach was attempted today to offer the patient with information about available care coordination services as a benefit of their health plan.     Follow Up Plan:  Additional outreach attempts will be made to offer the patient care coordination information and services.   Encounter Outcome:  No Answer  Care Coordination Interventions Activated:  No   Care Coordination Interventions:  No, not indicated    Jenel Lucks, MSW, LCSW [facility] Care Management [facility]  [facility] [email or web address] Phone [phone] 12:24 PM

## 2021-10-04 ENCOUNTER — Ambulatory Visit: Payer: Self-pay | Admitting: *Deleted

## 2021-10-04 ENCOUNTER — Telehealth: Payer: Self-pay

## 2021-10-04 NOTE — Patient Instructions (Signed)
Visit Information  Thank you for taking time to visit with me today. Please don't hesitate to contact me if I can be of assistance to you.   Following are the goals we discussed today:   Goals Addressed             This Visit's Progress    "I have so many needs"   On track    Care Coordination Interventions: Solution-Focused Strategies employed:  Active listening / Reflection utilized  Emotional Support Provided Provided psychoeducation for mental health needs  Participation in counseling encouraged  Participation in support group encouraged  Verbalization of feelings encouraged  Pt has difficulty managing psychosocial stressors, including an upcoming eviction. Hearing is schedule next week, per pt LCSW provided pt with contact information for Legal Aid, who can assist pt with additional support Patient endorses disassociation due to PTSD, memory concerns, and hx of avoidance. Patient reports limited support system. She is unable to reside with son LCSW discussed safety plan with patient. Crisis intervention resources were provided, including mobile crisis number  LCSW provided pt with colleague contact numbers, in the case she has any questions regarding supportive resources Patient endorses transportation issues that prevent her from filling mental health medication Patient is open to follow up with RN. LCSW will inform, Chrystal Land        COMPLETED: Manage My Emotions       Timeframe:  Long-Range Goal Priority:  Medium Start Date:    07/22/21                         Expected End Date:      09/23/21               Follow Up Date     --Please follow up with recommendations from the Department of Social Services - Why is this important?   When you are stressed, down or upset, your body reacts too.  For example, your blood pressure may get higher; you may have a headache or stomachache.  When your emotions get the best of you, your body's ability to fight off cold and flu  gets weak.  These steps will help you manage your emotions.     Notes:         Please call the care guide team at [phone] if you need to cancel or reschedule your appointment.   If you are experiencing a Mental Health or Behavioral Health Crisis or need someone to talk to, please call the Suicide and Crisis Lifeline: 988 call 911   The patient verbalized understanding of instructions, educational materials, and care plan provided today and DECLINED offer to receive copy of patient instructions, educational materials, and care plan.   LCSW Itha Kroeker will coordinate services with [employer] for continued care coordination services  Jenel Lucks, MSW, LCSW [facility] Care Management [facility]  [facility] [email or web address] Phone [phone] 11:00 AM

## 2021-10-04 NOTE — Telephone Encounter (Signed)
I spoke with Dawn [name]. She states she "doesn't know how to put in words" when I asked what was going on. She states "I feel they're listening to me. I have proof" she's unsure why "they" are listening, unsure of who "they" is. Inquired if she made psychiatry appt. and she stated no she hasn't. She never answered their calls or called back.  She wants to know what she should be doing. She wants her phone to be monitored. She states two female officers came and did a wellness check and have left already.  She got upset that I said "feel like" when she has "proof." She wants "[place] or the FBI to monitor her phone." She kept repeating that. She states she says "orange" when she feels like she's in danger. Unsure why she said that. She couldn't tell me any more and would not elaborate. Patient then hung up phone.

## 2021-10-04 NOTE — Patient Outreach (Signed)
  Care Coordination   Follow Up Visit Note   10/04/2021 Name: Fiorella M [place] MRN: [id number] DOB: Feb 01, 1941  Jemeka M [place] is a 81 y.o. year old female who sees Margarita Mail, DO for primary care. I  contacted Adult Protective Services to report concerns related to patient's ability to care for herself independently  What matters to the patients health and wellness today?  Housing    Goals Addressed             This Visit's Progress    "I have so many needs"       Care Coordination Interventions:  Phone call to patient, voicemail was full could not leave a message Collaboration phone call from Jenel Lucks, LCSW to discuss concerns related to patient's ability to provide care for herself.  Adult Protective Services Contacted to report patient's escalating confusion, paranoia,memory concerns, history of avoidance and poor follow through. Patient continues to face to eviction and there are concerns that patient is not taking her medications appropriately. Per chart review, law enforcement has been contacted for a wellness check. Adult Protective Services report made, spoke with Inetta Fermo [phone].           SDOH assessments and interventions completed:  Yes     Care Coordination Interventions Activated:  Yes  Care Coordination Interventions:  Yes, provided   Follow up plan:  Adult Protective Services contacted, this social worker to follow up within 1 wek    Encounter Outcome:  Pt. Visit Completed

## 2021-10-04 NOTE — Telephone Encounter (Signed)
Lvm for pt to return call to schedule appt    

## 2021-10-04 NOTE — Telephone Encounter (Signed)
  Chief Complaint: "In danger" Symptoms: NA Frequency: NA Pertinent Negatives: Patient denies NA Disposition: [] ED /[] Urgent Care (no appt availability in office) / [] Appointment(In office/virtual)/ []  [place] Virtual Care/ [] Home Care/ [] Refused Recommended Disposition /[] [place] Mobile Bus/ [x]  Follow-up with PCP Additional Notes: Pt called for refills, Effexor and Abilify. Stated to agent she was in danger. See summary from earlier this AM. Pt states she cannot talk about it "There's ears." States people are listening. States police did arrive for wellness check, cannot recall what was advised. Called practice to alert of situation, Melissa transferred call to [facility], call connected  to pt. Reason for Disposition  [1] Caller is very confused AND [2] no other adult (e.g., friend or family member) available  Answer Assessment - Initial Assessment Questions 1. SITUATION:  Document reason for call.     Refills 2. BACKGROUND: Document any background information (e.g., prior calls, known psychiatric history)      3. ASSESSMENT: Document your nursing assessment.     Pt states she is in danger 4. RESPONSE: Document what your response or recommendation was.  Protocols used: Difficult Call-A-AH

## 2021-10-04 NOTE — Telephone Encounter (Signed)
Please make patient an appointment

## 2021-10-04 NOTE — Patient Outreach (Signed)
  Care Coordination   Initial Visit Note   10/04/2021 Name: Dawn [name] MRN: [id number] DOB: 06-Apr-1940  Dawn [name] is a 81 y.o. year old female who sees Margarita Mail, DO for primary care. I spoke with  Dawn [name] by phone today  What matters to the patients health and wellness today?  Eviction Assistance    Goals Addressed             This Visit's Progress    "I have so many needs"   On track    Care Coordination Interventions: Solution-Focused Strategies employed:  Active listening / Reflection utilized  Emotional Support Provided Provided psychoeducation for mental health needs  Participation in counseling encouraged  Participation in support group encouraged  Verbalization of feelings encouraged  Pt has difficulty managing psychosocial stressors, including an upcoming eviction. Hearing is schedule next week, per pt LCSW provided pt with contact information for Legal Aid, who can assist pt with additional support Patient endorses disassociation due to PTSD, memory concerns, and hx of avoidance. Patient reports limited support system. She is unable to reside with son LCSW discussed safety plan with patient. Crisis intervention resources were provided, including mobile crisis number  LCSW provided pt with colleague contact numbers, in the case she has any questions regarding supportive resources Patient endorses transportation issues that prevent her from filling mental health medication Patient is open to follow up with RN. LCSW will inform, Chrystal Land        COMPLETED: Manage My Emotions       Timeframe:  Long-Range Goal Priority:  Medium Start Date:    07/22/21                         Expected End Date:      09/23/21               Follow Up Date     --Please follow up with recommendations from the Department of Social Services - Why is this important?   When you are stressed, down or upset, your body reacts too.  For example, your blood pressure may  get higher; you may have a headache or stomachache.  When your emotions get the best of you, your body's ability to fight off cold and flu gets weak.  These steps will help you manage your emotions.     Notes:         SDOH assessments and interventions completed:  Yes     Care Coordination Interventions Activated:  Yes  Care Coordination Interventions:  Yes, provided   Follow up plan:  LCSW collaborate with Chrystal Land, LCSW about follow up appt    Encounter Outcome:  Pt. Visit Completed   Jenel Lucks, MSW, LCSW [facility] Care Managment [facility]  [facility] [email or web address] Phone [phone] 10:53 AM

## 2021-10-04 NOTE — Telephone Encounter (Signed)
  Chief Complaint: having PTSD episode Symptoms: anxiety attack, can not remember suddenly Frequency: started 2 days ago, worsening and she is scared Pertinent Negatives: Patient denies na Disposition: [x] ED /[] Urgent Care (no appt availability in office) / [] Appointment(In office/virtual)/ []  [place] Virtual Care/ [] Home Care/ [] Refused Recommended Disposition /[] [place] Mobile Bus/ []  Follow-up with PCP Additional Notes: 911 called for pt who is having a mental health crisis. Unsure if EMS or Police will go, 911 operator was going to stay on with pt after I hung up.   Reason for Disposition  [1] Panic attack symptoms (diagnosed in the past) AND [2] not better with usual treatment, reassurance, or Care Advice  Answer Assessment - Initial Assessment Questions 1. CONCERN: "Did anything happen that prompted you to call today?"      Has PTSD, feels like loosing memory 2. ANXIETY SYMPTOMS: "Can you describe how you (your loved one; patient) have been feeling?" (e.g., tense, restless, panicky, anxious, keyed up, overwhelmed, sense of impending doom).      PTSD, cold sweats 3. ONSET: "How long have you been feeling this way?" (e.g., hours, days, weeks)     3 days 4. SEVERITY: "How would you rate the level of anxiety?" (e.g., 0 - 10; or mild, moderate, severe).     10 5. FUNCTIONAL IMPAIRMENT: "How have these feelings affected your ability to do daily activities?" "Have you had more difficulty than usual doing your normal daily activities?" (e.g., getting better, same, worse; self-care, school, work, interactions)     Walks with cane 6. HISTORY: "Have you felt this way before?" "Have you ever been diagnosed with an anxiety problem in the past?" (e.g., generalized anxiety disorder, panic attacks, PTSD). If Yes, ask: "How was this problem treated?" (e.g., medicines, counseling, etc.)     Yes with PTSD 7. RISK OF HARM - SUICIDAL IDEATION: "Do you ever have thoughts of hurting or killing  yourself?" If Yes, ask:  "Do you have these feelings now?" "Do you have a plan on how you would do this?"  no  Protocols used: Anxiety and Panic Attack-A-AH

## 2021-10-07 ENCOUNTER — Ambulatory Visit: Payer: Self-pay | Admitting: *Deleted

## 2021-10-07 ENCOUNTER — Ambulatory Visit: Payer: Self-pay

## 2021-10-07 NOTE — Telephone Encounter (Signed)
      Chief Complaint: Feeling anxious, not sleeping well, "ever since I won the lottery." Symptoms: Asking for Effexor 75 mg and "something for my nerves and I need to sleep." Frequency: "Since I won the lottery." Pertinent Negatives: Patient denies and thoughts of self harm. Disposition: [] ED /[] Urgent Care (no appt availability in office) / [] Appointment(In office/virtual)/ []  [facility]/ [] Home Care/ [] Refused Recommended Disposition /[] [facility]/ [x]  Follow-up with PCP Additional Notes: Declines OV. Requests medication.  Answer Assessment - Initial Assessment Questions 1. CONCERN: "Did anything happen that prompted you to call today?"      Unsure 2. ANXIETY SYMPTOMS: "Can you describe how you (your loved one; patient) have been feeling?" (e.g., tense, restless, panicky, anxious, keyed up, overwhelmed, sense of impending doom).      Anxiety 3. ONSET: "How long have you been feeling this way?" (e.g., hours, days, weeks)     Today 4. SEVERITY: "How would you rate the level of anxiety?" (e.g., 0 - 10; or mild, moderate, severe).     Moderate 5. FUNCTIONAL IMPAIRMENT: "How have these feelings affected your ability to do daily activities?" "Have you had more difficulty than usual doing your normal daily activities?" (e.g., getting better, same, worse; self-care, school, work, interactions)     Not sleeping well 6. HISTORY: "Have you felt this way before?" "Have you ever been diagnosed with an anxiety problem in the past?" (e.g., generalized anxiety disorder, panic attacks, PTSD). If Yes, ask: "How was this problem treated?" (e.g., medicines, counseling, etc.)     Yes, PTSD 7. RISK OF HARM - SUICIDAL IDEATION: "Do you ever have thoughts of hurting or killing yourself?" If Yes, ask:  "Do you have these feelings now?" "Do you have a plan on how you would do this?"     nO 8. TREATMENT:  "What has been done so far to treat this anxiety?" (e.g., medicines, relaxation  strategies). "What has helped?"     Effexor 9. TREATMENT - THERAPIST: "Do you have a counselor or therapist? Name?"     No 10. POTENTIAL TRIGGERS: "Do you drink caffeinated beverages (e.g., coffee, colas, teas), and how much daily?" "Do you drink alcohol or use any drugs?" "Have you started any new medicines recently?"       No 11. PATIENT SUPPORT: "Who is with you now?" "Who do you live with?" "Do you have family or friends who you can talk to?"        No 12. OTHER SYMPTOMS: "Do you have any other symptoms?" (e.g., feeling depressed, trouble concentrating, trouble sleeping, trouble breathing, palpitations or fast heartbeat, chest pain, sweating, nausea, or diarrhea)       No 13. PREGNANCY: "Is there any chance you are pregnant?" "When was your last menstrual period?"       No  Protocols used: Anxiety and Panic Attack-A-AH

## 2021-10-07 NOTE — Telephone Encounter (Signed)
Called appt  scheduled 

## 2021-10-07 NOTE — Patient Instructions (Signed)
Visit Information  Thank you for taking time to visit with me today. Please don't hesitate to contact me if I can be of assistance to you.   Following are the goals we discussed today:   Goals Addressed             This Visit's Progress    "I have so many needs"       Care Coordination Interventions:  Follow up Phone call to patient, patient discussed improvement in her mood, stating that she received a message on her phone that she had won a car and several thousands of dollars.  Patient warned about the possibility that her winnings may be a scam and to exercise caution Patient has a scheduled appointment with PCP on 10/08/21-states that she will be transported by [facility] Patient confirmed that she received a letter in the mail regarding the court date for her eviction hearing, stating that is has been postponed until the end of the month Patient is not sure if she will attend the hearing which was highly encouraged Collaboration phone to patient's daughter in law Doyce Loose to discuss concerns related to patient's ability to provide care for herself and the recommendation for Assisted Living Per Marcelino Duster, she and patient's son visited patient over the weekend and discussed with her their concerns related to housing and winnings They are agreeable to moving patient to an assisted living  Adult Protective Services to be contacted if needed to continue to discuss concerns related to patient's capacity.           Our next appointment is by telephone on 10/08/21 at 3pm  Please call the care guide team at [phone] if you need to cancel or reschedule your appointment.   If you are experiencing a Mental Health or Behavioral Health Crisis or need someone to talk to, please call the Suicide and Crisis Lifeline: [phone]   Patient verbalizes understanding of instructions and care plan provided today and agrees to view in MyChart. Active MyChart status and patient understanding of how to  access instructions and care plan via MyChart confirmed with patient.     Telephone follow up appointment with care management team member scheduled for: 10/08/21  Verna Czech, LCSW Clinical Social Worker  [facility] Care Management [phone]

## 2021-10-07 NOTE — Patient Outreach (Addendum)
  Care Coordination   Follow Up Visit Note   10/07/2021 Name: Dawn [name] MRN: [id number] DOB: 08-26-1940  Dawn [name] is a 81 y.o. year old female who sees Margarita Mail, DO for primary care. I spoke with  Dawn [name] by phone today  What matters to the patients health and wellness today?  Housing    Goals Addressed             This Visit's Progress    "I have so many needs"       Care Coordination Interventions:  Follow up Phone call to patient, patient discussed improvement in her mood, stating that she received a message on her phone that she had won a car and several thousands of dollars.  Patient warned about the possibility that her winnings may be a scam and to exercise caution Patient has a scheduled appointment with PCP on 10/08/21-states that she will be transported by [facility] Patient confirmed that she received a letter in the mail regarding the court date for her eviction hearing, stating that is has been postponed until the end of the month Patient is not sure if she will attend the hearing which was highly encouraged Collaboration phone to patient's daughter in law Dawn [name] to discuss concerns related to patient's ability to provide care for herself and the recommendation for Assisted Living Per Dawn [name], she and patient's son visited patient over the weekend and discussed with her their concerns related to housing and winnings They are agreeable to moving patient to an assisted living  Adult Protective Services to be contacted if needed to continue to discuss concerns related to patient's capacity.           SDOH assessments and interventions completed:  Yes     Care Coordination Interventions Activated:  Yes  Care Coordination Interventions:  Yes, provided   Follow up plan: Follow up call scheduled for 10/08/21    Encounter Outcome:  Pt. Scheduled

## 2021-10-08 ENCOUNTER — Ambulatory Visit (INDEPENDENT_AMBULATORY_CARE_PROVIDER_SITE_OTHER): Payer: 59 | Admitting: Internal Medicine

## 2021-10-08 ENCOUNTER — Ambulatory Visit: Payer: Self-pay | Admitting: *Deleted

## 2021-10-08 ENCOUNTER — Encounter: Payer: Self-pay | Admitting: Internal Medicine

## 2021-10-08 VITALS — BP 144/72 | HR 115 | Temp 97.4°F | Resp 16 | Wt 203.0 lb

## 2021-10-08 DIAGNOSIS — E1140 Type 2 diabetes mellitus with diabetic neuropathy, unspecified: Secondary | ICD-10-CM | POA: Diagnosis not present

## 2021-10-08 DIAGNOSIS — F4310 Post-traumatic stress disorder, unspecified: Secondary | ICD-10-CM | POA: Diagnosis not present

## 2021-10-08 DIAGNOSIS — F331 Major depressive disorder, recurrent, moderate: Secondary | ICD-10-CM | POA: Diagnosis not present

## 2021-10-08 MED ORDER — VENLAFAXINE HCL ER 150 MG PO CP24: ORAL_CAPSULE | ORAL | 1 refills | Status: AC

## 2021-10-08 MED ORDER — VENLAFAXINE HCL ER 75 MG PO CP24: ORAL_CAPSULE | ORAL | 1 refills | Status: AC

## 2021-10-08 NOTE — Progress Notes (Signed)
Established Patient Office Visit  Subjective   Patient ID: Dawn [name], female    DOB: 06-22-1940  Age: 81 y.o. MRN: [id number]  Chief Complaint  Patient presents with   Follow-up   Depression    HPI Patient is here for follow up. Patient is here with her daughter in law [place].   Patient has been dealing with a stressful living situation over the last few weeks, was going to be evicted but still living at her current apartment. Plan for hearing next week to determine if she can stay there. Has not been on her Effexor or Abilify due to insurance coverage issues - has been out of medication for weeks. This has exacerbated her anxiety, paranoia and at times delusional behavior. She has called here multiple times with confusion and delusional thinking.   MDD: -Mood status: exacerbated  -Current treatment: Usually on Effexor XR 150 in the am and 75 XR in the pm but has bene out of this medication for >4 weeks as well as Abilify 5 mg.  -Satisfied with current treatment?: yes -Symptom severity: severe -Duration of current treatment : years - had been on Effexor 225 for years, decreased in the hospital -Side effects: no Medication compliance: excellent compliance when it is available to her  Psychotherapy/counseling: no in the past, has spoken to social worker about it   Depressed mood: yes Anxious mood: yes Denies visual or auditory hallucinations today Denies paranoid or delusional thinking today       10/08/2021   10:48 AM 09/03/2021   10:05 AM 07/22/2021    9:21 AM 07/01/2021    2:15 PM 06/03/2021    9:20 AM  Depression screen PHQ 2/9  Decreased Interest 0 0 0 0 0  Down, Depressed, Hopeless 0 3 0 0 2  PHQ - 2 Score 0 3 0 0 2  Altered sleeping 0 3  0 3  Tired, decreased energy 0 3  0 3  Change in appetite 0 2  0 0  Feeling bad or failure about yourself  0 3  0 1  Trouble concentrating 0 3  0 3  Moving slowly or fidgety/restless 0 0  0 0  Suicidal thoughts 0 0  0 0  PHQ-9  Score 0 17  0 12  Difficult doing work/chores Not difficult at all Very difficult  Not difficult at all Somewhat difficult   Hypertension/CHF: -Medications: None, was on a beta blocker previously for about 1 year but ran out of it. No side effects. Had taken Lasix in the past but became really shaky and tremulous  -Does not check blood pressure at home  -Denies chest pain, shortness of breath, dizziness, leg swelling  Diabetes, Type 2: -Last A1c 6.7% 6/23 -Medications: Metformin 500 mg  - causing drops in sugar/shakiness, taking irregularly  -Checking sugars: <200 fasting  -Foot exam: Decreased sensation 6/23 -Microalbumin: Due at follow-up -Statin: No -PNA vaccine: Up-to-date -Denies symptoms of hypoglycemia, polyuria, polydipsia, foot ulcers/trauma.    Health Maintenance: -Blood work UTD -Mammogram/DEXA not interested in screening at this time      Review of Systems  All other systems reviewed and are negative.     Objective:     Pulse (!) 115   Temp (!) 97.4 F (36.3 C)   Resp 16   Wt 203 lb (92.1 kg)   SpO2 98%   BMI 35.96 kg/m  BP Readings from Last 3 Encounters:  07/01/21 136/84  06/03/21 122/66  06/02/21 (!) 147/69  Wt Readings from Last 3 Encounters:  10/08/21 203 lb (92.1 kg)  07/01/21 203 lb 4.8 oz (92.2 kg)  06/03/21 205 lb 8 oz (93.2 kg)      Physical Exam Constitutional:      Appearance: Normal appearance.  HENT:     Head: Normocephalic and atraumatic.  Eyes:     Conjunctiva/sclera: Conjunctivae normal.  Cardiovascular:     Rate and Rhythm: Normal rate and regular rhythm.  Pulmonary:     Effort: Pulmonary effort is normal.     Breath sounds: Normal breath sounds.  Skin:    General: Skin is warm and dry.  Neurological:     General: No focal deficit present.     Mental Status: She is alert. Mental status is at baseline.  Psychiatric:        Mood and Affect: Mood normal.        Behavior: Behavior normal.     No results found for  any visits on 10/08/21.  Last CBC Lab Results  Component Value Date   WBC 13.8 (H) 07/01/2021   HGB 14.2 07/01/2021   HCT 42.4 07/01/2021   MCV 93.8 07/01/2021   MCH 31.4 07/01/2021   RDW 12.4 07/01/2021   PLT 343 98/92/1194   Last metabolic panel Lab Results  Component Value Date   GLUCOSE 172 (H) 07/01/2021   NA 135 07/01/2021   K 5.5 (H) 07/01/2021   CL 100 07/01/2021   CO2 24 07/01/2021   BUN 19 07/01/2021   CREATININE 0.99 (H) 07/01/2021   EGFR 58 (L) 07/01/2021   CALCIUM 9.5 07/01/2021   PROT 6.9 07/01/2021   ALBUMIN 3.6 06/03/2021   BILITOT 0.5 07/01/2021   ALKPHOS 225 (H) 06/03/2021   AST 24 07/01/2021   ALT 32 (H) 07/01/2021   ANIONGAP 9 06/03/2021   Last lipids Lab Results  Component Value Date   CHOL 249 (H) 07/01/2021   HDL 71 07/01/2021   LDLCALC 145 (H) 07/01/2021   TRIG 193 (H) 07/01/2021   CHOLHDL 3.5 07/01/2021   Last hemoglobin A1c Lab Results  Component Value Date   HGBA1C 6.7 (H) 07/01/2021   Last thyroid functions Lab Results  Component Value Date   TSH 2.44 07/01/2021   Last vitamin D Lab Results  Component Value Date   VD25OH 34 07/01/2021   Last vitamin B12 and Folate Lab Results  Component Value Date   VITAMINB12 356 07/01/2021   FOLATE 14.3 07/01/2021      The ASCVD Risk score (Arnett DK, et al., 2019) failed to calculate for the following reasons:   The 2019 ASCVD risk score is only valid for ages 41 to 9    Assessment & Plan:   1. PTSD (post-traumatic stress disorder)/Moderate episode of recurrent major depressive disorder [facility]): Patient with organized and clear mentation today but has has several delusional/paranoid episodes in the last few weeks without her medication. Restart Effexor 150 mg in the morning and 75 mg in the evening, along with Abilify 5 mg daily. Follow up in 1 month for recheck.  - venlafaxine XR (EFFEXOR XR) 75 MG 24 hr capsule; Take 75 mg in every evening.  Dispense: 90 capsule; Refill: 1 -  venlafaxine XR (EFFEXOR XR) 150 MG 24 hr capsule; Take 150 mg by mouth every morning.  Dispense: 90 capsule; Refill: 1  2. Type 2 diabetes mellitus with diabetic neuropathy, without long-term current use of insulin ([place]): A1c controlled, not tolerating Metformin well. Plan to discontinue Metformin, continue diet  and lifestyle changes and recheck A1c at follow up.   Return in about 4 weeks (around 11/05/2021) for in person .    Teodora Medici, DO

## 2021-10-08 NOTE — Patient Instructions (Signed)
Visit Information  Thank you for taking time to visit with me today. Please don't hesitate to contact me if I can be of assistance to you.   Following are the goals we discussed today:   Goals Addressed             This Visit's Progress    "I have so many needs"       Care Coordination Interventions:  Follow up Phone call to patient's daughter in law Marcelino Duster who was able to accompany patient to her provider's office today-patient gave verbal permission on 10/07/21 to speak with daughter in law Per patient's daughter in law, patient has been re-started on Effexor and Ability Patient's upcoming court hearing discussed, re-scheduled for 10/16/21 however patient remains in denial regarding her circumstances-Assisted Living discussed during office visit today, patient refuses to pursue this. This Child psychotherapist discussed the possibility of daughter in law attending the court hearing with patient, however she will not be able to due to her work schedule Housing options continue to be discussed, if patient declines Assisted Living, the local woman's shelter discussed as a possibility. Adult Protective Services has been contacted X2, no return call to date         Our next appointment is by telephone on 10/15/21 at 11am  Please call the care guide team at [phone] if you need to cancel or reschedule your appointment.   If you are experiencing a Mental Health or Behavioral Health Crisis or need someone to talk to, please call the Suicide and Crisis Lifeline: 988   Patient verbalizes understanding of instructions and care plan provided today and agrees to view in MyChart. Active MyChart status and patient understanding of how to access instructions and care plan via MyChart confirmed with patient.     Telephone follow up appointment with care management team member scheduled for: 10/15/21   Verna Czech, LCSW Clinical Social Worker  [facility] Care Management [phone]

## 2021-10-08 NOTE — Patient Instructions (Signed)
It was great seeing you today!  Plan discussed at today's visit: -Effexor resent to pharamcy - take 150 mg in the morning and 75 at night. If you do not have the medication by Friday, please call here and ask for Dr. Caralee Ates' nurse.  -Pick up Abilify 5 mg from the pharmacy today and start taking -Stop Metformin, continue diet and we will recheck A1c in 1 month  Follow up in: 1 month   Take care and let us know if you have any questions or concerns prior to your next visit.  Dr. Caralee Ates

## 2021-10-08 NOTE — Patient Outreach (Signed)
  Care Coordination   Initial Visit Note   10/08/2021 Name: Nakeya M [place] MRN: [id number] DOB: 08-01-1940  Estle M [place] is a 81 y.o. year old female who sees Margarita Mail, DO for primary care. I spoke with  Jahni M [place] daughter in law [place] by phone today  What matters to the patients health and wellness today?  Housing    Goals Addressed             This Visit's Progress    "I have so many needs"       Care Coordination Interventions:  Follow up Phone call to patient's daughter in law Marcelino Duster who was able to accompany patient to her provider's office today-patient gave verbal permission on 10/07/21 to speak with daughter in law Per patient's daughter in law, patient has been re-started on Effexor and Ability Patient's upcoming court hearing discussed, re-scheduled for 10/16/21 however patient remains in denial regarding her circumstances-Assisted Living discussed during office visit today, patient refuses to pursue this. This Child psychotherapist discussed the possibility of daughter in law attending the court hearing with patient, however she will not be able to due to her work schedule Housing options continue to be discussed, if patient declines Assisted Living, the local woman's shelter discussed as a possibility. Adult Protective Services has been contacted X2, no return call to date         SDOH assessments and interventions completed:  Yes     Care Coordination Interventions Activated:  Yes  Care Coordination Interventions:  Yes, provided   Follow up plan: Follow up call scheduled for 10/15/21    Encounter Outcome:  Pt. Scheduled

## 2021-10-14 ENCOUNTER — Encounter: Payer: Self-pay | Admitting: Licensed Clinical Social Worker

## 2021-10-14 ENCOUNTER — Telehealth: Payer: Self-pay

## 2021-10-14 NOTE — Telephone Encounter (Signed)
Got nurse call paperwork over the weekend that patient was having difficulty getting the Effexor.  Called the pharmacy today and they state she picked up both doses on 8/26.  Called and notified patient and told her to call back with any problems.

## 2021-10-15 ENCOUNTER — Telehealth: Payer: Self-pay | Admitting: *Deleted

## 2021-10-15 ENCOUNTER — Ambulatory Visit: Payer: Self-pay | Admitting: *Deleted

## 2021-10-15 NOTE — Patient Instructions (Signed)
Visit Information  Thank you for taking time to visit with me today. Please don't hesitate to contact me if I can be of assistance to you.   Following are the goals we discussed today:   Goals Addressed             This Visit's Progress    "I have so many needs"       Care Coordination Interventions:  Return phone call from patient stating plan to attend her eviction hearing tomorrow-she is asking what to expect Patient states that now she is back on her medications, she is now back to baseline This Child psychotherapist provided patient with the contact information for the Senior Line through Legal Aid [phone] hours 9-11am and 1pm-3pm . Encouraged patient to call for legal advice regarding her upcoming eviction hearing This social worker reviewed the eviction guide from the legal aid website with patient in preparation for her hearing tomorrow Patient now willing to consider Assisted Living, refuses option for the [facility] shelter at this time Patient will plan to attend the hearing and will continue to consider alternative housing options.        Our next appointment is by telephone on 10/23/21 at 10am  Please call the care guide team at [phone] if you need to cancel or reschedule your appointment.   If you are experiencing a Mental Health or Behavioral Health Crisis or need someone to talk to, please call the Suicide and Crisis Lifeline: 988   Patient verbalizes understanding of instructions and care plan provided today and agrees to view in MyChart. Active MyChart status and patient understanding of how to access instructions and care plan via MyChart confirmed with patient.     Telephone follow up appointment with care management team member scheduled for: 10/23/21  Verna Czech, LCSW Clinical Social Worker  [facility] Care Management [phone]

## 2021-10-15 NOTE — Patient Outreach (Signed)
  Care Coordination   Follow Up Visit Note   10/15/2021 Name: Moani M [place] MRN: [id number] DOB: [birth year]  Shalayne M [place] is a 81 y.o. year old female who sees Margarita Mail, DO for primary care. I spoke with  Naviah M [place] by phone today.  What matters to the patients health and wellness today?  Housing options    Goals Addressed             This Visit's Progress    "I have so many needs"       Care Coordination Interventions:  Return phone call from patient stating plan to attend her eviction hearing tomorrow-she is asking what to expect Patient states that now she is back on her medications, she is now back to baseline This Child psychotherapist provided patient with the contact information for the Senior Line through Legal Aid [phone] hours 9-11am and 1pm-3pm . Encouraged patient to call for legal advice regarding her upcoming eviction hearing This social worker reviewed the eviction guide from the legal aid website with patient in preparation for her hearing tomorrow Patient now willing to consider Assisted Living, refuses option for the [facility] shelter at this time Patient will plan to attend the hearing and will continue to consider alternative housing options.        SDOH assessments and interventions completed:  Yes     Care Coordination Interventions Activated:  Yes  Care Coordination Interventions:  Yes, provided   Follow up plan: Follow up call scheduled for 10/23/21    Encounter Outcome:  Pt. Visit Completed

## 2021-10-15 NOTE — Patient Outreach (Signed)
  Care Coordination   10/15/2021 Name: Dawn [name] MRN: [id number] DOB: Jan 11, 1941   Care Coordination Outreach Attempts:  An unsuccessful telephone outreach was attempted today to offer the patient information about available care coordination services as a benefit of their health plan.   Follow Up Plan:  Additional outreach attempts will be made to offer the patient care coordination information and services.   Encounter Outcome:  No Answer  Care Coordination Interventions Activated:  No   Care Coordination Interventions:  No, not indicated    Emigdio Wildeman, LCSW Clinical Social Worker  [facility] Care Management [phone]

## 2021-10-16 ENCOUNTER — Ambulatory Visit: Payer: Self-pay | Admitting: *Deleted

## 2021-10-16 NOTE — Patient Outreach (Signed)
  Care Coordination   Follow Up Visit Note   10/16/2021 Name: Dawn [name] MRN: [id number] DOB: [birth year]  Dawn [name] is a 81 y.o. year old female who sees Margarita Mail, DO for primary care. I spoke with  Dawn [name] by phone today.  What matters to the patients health and wellness today?  Housing    Goals Addressed             This Visit's Progress    "I have so many needs"       Care Coordination Interventions:  Incoming phone call from patient who states that her checkbook is missing Patient encouraged to contact her bank to check withdrawals or any other suspicious activity Patient states that she did not attend her eviction hearing today and did not contact Legal Aid for assistance with her eviction hearing Encouraged patient again to contact legal aid for eviction advocacy [phone] between 9-11am and 1pm-3pm. Will await return call from patient's daughter in law to discuss the above concerns         SDOH assessments and interventions completed:  Yes     Care Coordination Interventions Activated:  Yes  Care Coordination Interventions:  Yes, provided   Follow up plan: Follow up call scheduled for 10/23/21    Encounter Outcome:  Pt. Visit Completed

## 2021-10-16 NOTE — Patient Instructions (Signed)
Visit Information  Thank you for taking time to visit with me today. Please don't hesitate to contact me if I can be of assistance to you.   Following are the goals we discussed today:   Goals Addressed             This Visit's Progress    "I have so many needs"       Care Coordination Interventions:  Phone call from patient on 10/16/21 stating that she won grant money Patient states that she forgot to call This social worker encouraged patient to use caution when receiving notifications of winning large sums of money Encouraged patient again to contact legal aid for eviction advocacy [phone] between 9-11am and 1pm-3pm. VM left today with patient's daughter in law to discuss  the above concerns        Our next appointment is by telephone on  10/23/21 at 10am  Please call the care guide team at [phone] if you need to cancel or reschedule your appointment.   If you are experiencing a Mental Health or Behavioral Health Crisis or need someone to talk to, please call the Suicide and Crisis Lifeline: 988   Patient verbalizes understanding of instructions and care plan provided today and agrees to view in MyChart. Active MyChart status and patient understanding of how to access instructions and care plan via MyChart confirmed with patient.     Telephone follow up appointment with care management team member scheduled for: 10/23/21  Verna Czech, LCSW Clinical Social Worker  [facility] Care Management [phone]

## 2021-10-16 NOTE — Patient Instructions (Signed)
Visit Information  Thank you for taking time to visit with me today. Please don't hesitate to contact me if I can be of assistance to you.   Following are the goals we discussed today:   Goals Addressed             This Visit's Progress    "I have so many needs"       Care Coordination Interventions:  Incoming phone call from patient who states that her checkbook is missing Patient encouraged to contact her bank to check withdrawals or any other suspicious activity Patient states that she did not attend her eviction hearing today and did not contact Legal Aid for assistance with her eviction hearing Encouraged patient again to contact legal aid for eviction advocacy [phone] between 9-11am and 1pm-3pm. Will await return call from patient's daughter in law to discuss the above concerns         Our next appointment is by telephone on 10/23/21 at 10am  Please call the care guide team at [phone] if you need to cancel or reschedule your appointment.   If you are experiencing a Mental Health or Behavioral Health Crisis or need someone to talk to, please call the Suicide and Crisis Lifeline: 988   Patient verbalizes understanding of instructions and care plan provided today and agrees to view in MyChart. Active MyChart status and patient understanding of how to access instructions and care plan via MyChart confirmed with patient.     Telephone follow up appointment with care management team member scheduled for: 10/23/21 Verna Czech, LCSW Clinical Social Worker  [facility] Care Management [phone]

## 2021-10-16 NOTE — Patient Outreach (Signed)
  Care Coordination   Follow Up Visit Note   10/16/2021 Name: Dawn [name] MRN: [id number] DOB: 07/12/1940  Dawn [name] is a 81 y.o. year old female who sees Margarita Mail, DO for primary care. I spoke with  Dawn [name] by phone on 10/15/21.  What matters to the patients health and wellness today?  Housing    Goals Addressed             This Visit's Progress    "I have so many needs"       Care Coordination Interventions:  Phone call from patient on 10/16/21 stating that she won grant money Patient states that she forgot to call This social worker encouraged patient to use caution when receiving notifications of winning large sums of money Encouraged patient again to contact legal aid for eviction advocacy [phone] between 9-11am and 1pm-3pm. VM left today with patient's daughter in law to discuss  the above concerns         SDOH assessments and interventions completed:  Yes     Care Coordination Interventions Activated:  Yes  Care Coordination Interventions:  Yes, provided   Follow up plan: Follow up call scheduled for 10/19/21    Encounter Outcome:  Pt. Visit Completed

## 2021-10-22 ENCOUNTER — Telehealth: Payer: Self-pay | Admitting: Internal Medicine

## 2021-10-22 ENCOUNTER — Telehealth: Payer: Self-pay | Admitting: *Deleted

## 2021-10-22 NOTE — Patient Outreach (Signed)
  Care Coordination   10/22/2021 Name: Dawn [name] MRN: [id number] DOB: 10/01/1940   Care Coordination Outreach Attempts: Return call to patient per patient request Follow Up Plan:  Additional outreach attempts will be made to discuss  care coordination needs  Encounter Outcome:  No Answer  Care Coordination Interventions Activated:  No   Care Coordination Interventions:  No, not indicated    Kiya Eno, LCSW Clinical Social Worker  [facility] Care Management [phone]

## 2021-10-22 NOTE — Telephone Encounter (Signed)
Copied from CRM [id number]. Topic: General - Other >> Oct 22, 2021 12:18 PM Tiffany B wrote: Reason for CRM: Patient would like a return call from Chrystal [facility], patient did not elaborate.

## 2021-10-23 ENCOUNTER — Ambulatory Visit: Payer: Self-pay | Admitting: *Deleted

## 2021-10-23 NOTE — Patient Outreach (Signed)
  Care Management   Outreach Note  10/23/2021 Name: Dawn [name] MRN: [id number] DOB: 1940-09-05  Referred by: Margarita Mail, DO Reason for referral : Care Coordination    Second unsuccessful telephone outreach attempt to Ms. Dawn [name] today.   Follow Up Plan: The CM team will reach out to Ms. Dawn [name] again over the next 7 days.   Verna Czech, LCSW Clinical Social Worker  [facility] Care Management [phone]

## 2021-10-28 ENCOUNTER — Ambulatory Visit: Payer: Self-pay | Admitting: *Deleted

## 2021-10-28 ENCOUNTER — Ambulatory Visit: Payer: Self-pay

## 2021-10-28 ENCOUNTER — Telehealth: Payer: Self-pay | Admitting: Internal Medicine

## 2021-10-28 NOTE — Telephone Encounter (Signed)
Pt requesting a rx for her "nerves"  Please assist pt further

## 2021-10-28 NOTE — Telephone Encounter (Signed)
Pt requesting an order for a home health aid  Please assist pt further

## 2021-10-28 NOTE — Patient Outreach (Signed)
  Care Coordination   Follow Up Visit Note   10/28/2021 Name: Dawn [name] MRN: [id number] DOB: [birth year]  Dawn [name] is a 81 y.o. year old female who sees Teodora Medici, DO for primary care. I spoke with  Adult Protective Services worker-Joy Sumpter by phone today.  What matters to the patients health and wellness today?  housing    Goals Addressed             This Visit's Progress    "I have so many needs"       Care Coordination Interventions:  Incoming phone call from the Department of [place] [phone] cell, office [phone] who confirmed that she will be evaluating patient in the home due to the safety concerns reported Per Steffanie Dunn, she met with patient in her home, patient was a no show for her eviction hearing, however [employer] is continuing with the eviction process Steffanie Dunn has requested patient's medical records for review-fax number provided This Education officer, museum will continue to follow status of APS referral         SDOH assessments and interventions completed:  No     Care Coordination Interventions Activated:  Yes  Care Coordination Interventions:  Yes, provided   Follow up plan: Follow up call scheduled for 11/08/21    Encounter Outcome:  Pt. Visit Completed

## 2021-10-28 NOTE — Telephone Encounter (Signed)
Patient called, no answer, mailbox is full. See Nurse Triage encounter.

## 2021-10-28 NOTE — Telephone Encounter (Signed)
Would she qualify?

## 2021-10-28 NOTE — Telephone Encounter (Signed)
Pt called, unable to LVM d/t mailbox full. Will route to practice for review.

## 2021-10-28 NOTE — Telephone Encounter (Signed)
Patient called, no answer, voicemail full. Will need to be triaged for wanting a Rx for her nerves.      Pt requesting a rx for her "nerves"   Please assist pt further

## 2021-10-29 ENCOUNTER — Encounter: Payer: Self-pay | Admitting: Internal Medicine

## 2021-10-30 NOTE — Telephone Encounter (Signed)
Pt notified, and told to be sure to answers calls

## 2021-11-04 ENCOUNTER — Telehealth: Payer: Self-pay | Admitting: Internal Medicine

## 2021-11-04 NOTE — Telephone Encounter (Signed)
Pt is calling to speak with [place], Dawn [name] Pt is calling to report that she received an eviction notice. And she does not know what to do. Please advise

## 2021-11-05 ENCOUNTER — Encounter: Payer: Self-pay | Admitting: Internal Medicine

## 2021-11-05 ENCOUNTER — Ambulatory Visit: Payer: 59 | Admitting: Internal Medicine

## 2021-11-05 ENCOUNTER — Telehealth (INDEPENDENT_AMBULATORY_CARE_PROVIDER_SITE_OTHER): Payer: 59 | Admitting: Internal Medicine

## 2021-11-05 ENCOUNTER — Ambulatory Visit: Payer: Self-pay

## 2021-11-05 DIAGNOSIS — F331 Major depressive disorder, recurrent, moderate: Secondary | ICD-10-CM

## 2021-11-05 DIAGNOSIS — F419 Anxiety disorder, unspecified: Secondary | ICD-10-CM | POA: Diagnosis not present

## 2021-11-05 DIAGNOSIS — F4310 Post-traumatic stress disorder, unspecified: Secondary | ICD-10-CM

## 2021-11-05 MED ORDER — ARIPIPRAZOLE 5 MG PO TABS: 5.0000 mg | ORAL_TABLET | Freq: Every day | ORAL | 0 refills | Status: DC

## 2021-11-05 MED ORDER — HYDROXYZINE HCL 10 MG PO TABS: 10.0000 mg | ORAL_TABLET | Freq: Every day | ORAL | 0 refills | Status: DC | PRN

## 2021-11-05 NOTE — Telephone Encounter (Signed)
  Chief Complaint: PTSD Symptoms: scared, petrified, depressed Frequency: yesterday  Pertinent Negatives: Patient denies SI Disposition: [] ED /[] Urgent Care (no appt availability in office) / [x] Appointment(In office/virtual)/ []  [place] [facility]/ [] Home Care/ [] Refused Recommended Disposition /[]  Mobile Bus/ []  Follow-up with PCP Additional Notes: pt states she received eviction notice yesterday and has until tomorrow to move out. Pt states her PTSD is bad right now and she is just scared. Pt asked if someone would be able to speak on her behalf and get a loan for her to pay what she owes on apartment. I advised her that Chrystal would be the one to talk to her more about that and that message has already been sent to her. Spoke to [place], [facility] who said pt would schedule appt for PTSD if wanted. Pt was agreeable to schedule VV but didn't want to come to office for in person appt. Scheduled appt for 1300 today with Dr. Rosana Berger.   Reason for Disposition  MODERATE anxiety (e.g., persistent or frequent anxiety symptoms; interferes with sleep, school, or work)  Answer Assessment - Initial Assessment Questions 1. CONCERN: "Did anything happen that prompted you to call today?"      PTSD and got evicted  2. ANXIETY SYMPTOMS: "Can you describe how you (your loved one; patient) have been feeling?" (e.g., tense, restless, panicky, anxious, keyed up, overwhelmed, sense of impending doom).      Scared, depressed, petrified 3. ONSET: "How long have you been feeling this way?" (e.g., hours, days, weeks)     Yesterday  4. SEVERITY: "How would you rate the level of anxiety?" (e.g., 0 - 10; or mild, moderate, severe).     Moderate  5. FUNCTIONAL IMPAIRMENT: "How have these feelings affected your ability to do daily activities?" "Have you had more difficulty than usual doing your normal daily activities?" (e.g., getting better, same, worse; self-care, school, work, interactions)     Worse  6.  HISTORY: "Have you felt this way before?" "Have you ever been diagnosed with an anxiety problem in the past?" (e.g., generalized anxiety disorder, panic attacks, PTSD). If Yes, ask: "How was this problem treated?" (e.g., medicines, counseling, etc.)     PTSD, panic attacks  7. RISK OF HARM - SUICIDAL IDEATION: "Do you ever have thoughts of hurting or killing yourself?" If Yes, ask:  "Do you have these feelings now?" "Do you have a plan on how you would do this?"     No  8. TREATMENT:  "What has been done so far to treat this anxiety?" (e.g., medicines, relaxation strategies). "What has helped?"     Medication  9. TREATMENT - THERAPIST: "Do you have a counselor or therapist? Name?"     no 11. PATIENT SUPPORT: "Who is with you now?" "Who do you live with?" "Do you have family or friends who you can talk to?"        no  Protocols used: Anxiety and Panic Attack-A-AH

## 2021-11-05 NOTE — Progress Notes (Signed)
Virtual Visit via Telephone Note  I connected with Dawn [name] on 11/05/21 at  1:00 PM EDT by telephone and verified that I am speaking with the correct person using two identifiers.  Location: Patient: Home  Provider: [facility]   I discussed the limitations, risks, security and privacy concerns of performing an evaluation and management service by telephone and the availability of in person appointments. I also discussed with the patient that there may be a patient responsible charge related to this service. The patient expressed understanding and agreed to proceed.   History of Present Illness:  Patient has been dealing with a stressful living situation over the summer, was going to be evicted but still living at her current apartment. At that time she was out of her medications, which has exacerbated her anxiety, paranoia and at times delusional behavior. She has called here multiple times with confusion and delusional thinking. Since our last visit, she has been on her medications but states that her PTSD is still affecting her and she cannot do the things she wants to do. She is scared to leave her apartment and having intrusive paranoid thoughts.    MDD: -Mood status: exacerbated  -Current treatment: Usually on Effexor XR 150 in the am and 75 XR in the pm but has bene out of this medication for >4 weeks as well as Abilify 2 mg.  -Satisfied with current treatment?: no -Symptom severity: severe -Duration of current treatment : years, Abilify is new -Side effects: no Medication compliance: excellent compliance  Psychotherapy/counseling: no in the past, has spoken to Education officer, museum about it   Depressed mood: yes Anxious mood: yes Denies visual or auditory hallucinations today Denies paranoid or delusional thinking today      11/05/2021   12:04 PM 10/08/2021   10:48 AM 09/03/2021   10:05 AM 07/22/2021    9:21 AM 07/01/2021    2:15 PM  Depression screen PHQ 2/9  Decreased Interest 3 0 0 0  0  Down, Depressed, Hopeless 3 0 3 0 0  PHQ - 2 Score 6 0 3 0 0  Altered sleeping 2 0 3  0  Tired, decreased energy 3 0 3  0  Change in appetite 0 0 2  0  Feeling bad or failure about yourself  1 0 3  0  Trouble concentrating 1 0 3  0  Moving slowly or fidgety/restless 1 0 0  0  Suicidal thoughts 0 0 0  0  PHQ-9 Score 14 0 17  0  Difficult doing work/chores Very difficult Not difficult at all Very difficult  Not difficult at all   Observations/Objective:  General: well sounding, in no acute distress Neuro: answers all questions appropriately Psych: depressed mood  Assessment and Plan:  1. 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 Increase Abilify to 5 mg, Hydroxyzine sent to take as needed for anxiety/sleep. Follow up scheduled on 10/9 where we can increase the Abilify further if it is needed.   - ARIPiprazole (ABILIFY) 5 MG tablet; Take 1 tablet (5 mg total) by mouth daily.  Dispense: 30 tablet; Refill: 0 - hydrOXYzine (ATARAX) 10 MG tablet; Take 1 tablet (10 mg total) by mouth daily as needed for anxiety.  Dispense: 30 tablet; Refill: 0  Follow Up Instructions: 11/25/21, already scheduled    I discussed the assessment and treatment plan with the patient. The  patient was provided an opportunity to ask questions and all were answered. The patient agreed with the plan and demonstrated an understanding of the instructions.   The patient was advised to call back or seek an in-person evaluation if the symptoms worsen or if the condition fails to improve as anticipated.  I provided 9 minutes of non-face-to-face time during this encounter.   Margarita Mail, DO

## 2021-11-08 ENCOUNTER — Ambulatory Visit: Payer: Self-pay

## 2021-11-08 ENCOUNTER — Ambulatory Visit: Payer: Self-pay | Admitting: *Deleted

## 2021-11-08 NOTE — Patient Instructions (Signed)
Visit Information  Thank you for taking time to visit with me today. Please don't hesitate to contact me if I can be of assistance to you.   Following are the goals we discussed today:   Goals Addressed             This Visit's Progress    "I have so many needs"       Care Coordination Interventions:  Return call to patient who confirmed that she has officially been evicted Patient now staying at the [facility] on [address], however does not have long term plan Per patient, her daughter [name] moved her  Contact information for the  Department of [place] [phone] cell, office [phone] provided to patient to request possible housing resources  Phone call to [place] worker Steffanie Dunn, VM left requesting a return call regarding patient's housing status         Our next appointment is by telephone on 11/11/21 at 9am  Please call the care guide team at [phone] if you need to cancel or reschedule your appointment.   If you are experiencing a Mental Health or [place] or need someone to talk to, please call the Suicide and Crisis Lifeline: 988 call 911   Patient verbalizes understanding of instructions and care plan provided today and agrees to view in [place]. Active MyChart status and patient understanding of how to access instructions and care plan via MyChart confirmed with patient.     Telephone follow up appointment with care management team member scheduled for: 11/11/21   Elliot Gurney, [place] Worker  [facility] Care Management [phone]

## 2021-11-08 NOTE — Telephone Encounter (Signed)
Tried to cal patient x2

## 2021-11-08 NOTE — Patient Outreach (Signed)
  Care Coordination   Follow Up Visit Note   11/08/2021 Name: Dawn [name] MRN: [id number] DOB: 1940-09-03  Dawn [name] is a 81 y.o. year old female who sees Dawn Medici, DO for primary care. I spoke with  Dawn [name] by phone today.  What matters to the patients health and wellness today?  housing    Goals Addressed             This Visit's Progress    "I have so many needs"       Care Coordination Interventions:  Return call to patient who confirmed that she has officially been evicted Patient now staying at the [facility] on [address], however does not have long term plan Per patient, her daughter Dawn [name] moved her  Contact information for the  Department of [place] [phone] cell, office [phone] provided to patient to request possible housing resources  Phone call to [place] worker Dawn [name], VM left requesting a return call regarding patient's housing status         SDOH assessments and interventions completed:  No     Care Coordination Interventions Activated:  Yes  Care Coordination Interventions:  Yes, provided   Follow up plan: Follow up call scheduled for 11/11/21    Encounter Outcome:  Pt. Visit Completed

## 2021-11-08 NOTE — Telephone Encounter (Signed)
Called to check in on patient.  She did get evicted and is staying at [facility] and has food and drink family knows.  Pt told to call if she needs [place].

## 2021-11-08 NOTE — Telephone Encounter (Addendum)
     Chief Complaint: Pt. Reports her daughter, Dawn [name], brought her to [place] in [place] - address [address]. "I think I was evicted from [facility]." States she has not eaten since yesterday. Has had a Sprite and is drinking water. States she feels safe. "I just don't know when she'll be back." Symptoms: Urine is dark, feels tired, but "I'm better now after talking to you." Gave nurse permission to call Dawn [name], daughter, number [phone]. Left message to call back.  Frequency: today Pertinent Negatives: Patient denies any other symptoms. Disposition: [] ED /[] Urgent Care (no appt availability in office) / [] Appointment(In office/virtual)/ []  [place] Virtual Care/ [] Home Care/ [] Refused Recommended Disposition /[] [place] Mobile Bus/ [x]  Follow-up with PCP Additional Notes: Offered to call EMS for pt. , declines. "I feel better now and I'll keep drinking water." Roselyn Reef in the practice notified. Answer Assessment - Initial Assessment Questions 1. LEVEL OF CONSCIOUSNESS: "How is he (she, the patient) acting right now?" (e.g., alert-oriented, confused, lethargic, stuporous, comatose)     Pt. Is alert 2. ONSET: "When did the confusion start?"  (minutes, hours, days)     Last night 3. PATTERN "Does this come and go, or has it been constant since it started?"  "Is it present now?"     Unsure 4. ALCOHOL or DRUGS: "Has he been drinking alcohol or taking any drugs?"      None known 5. NARCOTIC MEDICINES: "Has he been receiving any narcotic medications?" (e.g., morphine, Vicodin)     No 6. CAUSE: "What do you think is causing the confusion?"      Unsure 7. OTHER SYMPTOMS: "Are there any other symptoms?" (e.g., difficulty breathing, headache, fever, weakness)     Pt. Is tired, achy, has dark urine  Protocols used: Confusion - Delirium-A-AH

## 2021-11-11 ENCOUNTER — Encounter: Payer: Self-pay | Admitting: *Deleted

## 2021-11-15 ENCOUNTER — Encounter: Payer: Self-pay | Admitting: *Deleted

## 2021-11-15 ENCOUNTER — Telehealth: Payer: Self-pay | Admitting: *Deleted

## 2021-11-15 NOTE — Patient Outreach (Signed)
  Care Coordination   11/15/2021 Name: Dawn [name] MRN: [id number] DOB: 1940-03-22   Care Coordination Outreach Attempts:  An unsuccessful telephone outreach was attempted for a scheduled appointment today.  Follow Up Plan:  Additional outreach attempts will be made to offer the patient care coordination information and services.   Encounter Outcome:  No Answer  Care Coordination Interventions Activated:  No   Care Coordination Interventions:  No, not indicated    Arvie Villarruel, [place] Worker  [facility] Care Management [phone]

## 2021-11-19 ENCOUNTER — Ambulatory Visit: Payer: Self-pay | Admitting: *Deleted

## 2021-11-19 NOTE — Patient Instructions (Signed)
Visit Information  Thank you for taking time to visit with me today. Please don't hesitate to contact me if I can be of assistance to you.   Following are the goals we discussed today:   Goals Addressed             This Visit's Progress    "I have so many needs"       Care Coordination Interventions:  Return call to patient who continues to confirm that she has officially been evicted Patient now staying at the [facility] on [address], patient is interested in moving to [employer], continues to decline Assisted Living Placement Per patient, her daughter [name] moved her, I snow managing her money and working towards finding her a permanent place to live [employer] for the  Department of [place], office [phone] provided to patient to request possible housing resources  Additional Phone call to [place] worker Steffanie Dunn, VM left requesting a return call regarding patient's housing status Patient provided with a list of [place] contact numbers. Per patient, she has already submitted an application for [employer] This social worker will continue to follow to assist with identifying permanent housing         Our next appointment is by telephone on 12/03/21  at 10am  Please call the care guide team at [phone] if you need to cancel or reschedule your appointment.   If you are experiencing a Mental Health or [place] or need someone to talk to, please call the Suicide and Crisis Lifeline: 988 call 911   Patient verbalizes understanding of instructions and care plan provided today and agrees to view in [place]. Active MyChart status and patient understanding of how to access instructions and care plan via MyChart confirmed with patient.     Telephone follow up appointment with care management team member scheduled for: 12/03/21  Elliot Gurney, [place] Worker  [facility] Care  Management [phone]

## 2021-11-19 NOTE — Patient Outreach (Signed)
  Care Coordination   Follow Up Visit Note   11/19/2021 Name: Dawn [name] MRN: [id number] DOB: May 02, 1940  Dawn [name] is a 81 y.o. year old female who sees Teodora Medici, DO for primary care. I spoke with  Dawn [name] by phone today.  What matters to the patients health and wellness today?  housing    Goals Addressed             This Visit's Progress    "I have so many needs"       Care Coordination Interventions:  Return call to patient who continues to confirm that she has officially been evicted Patient now staying at the [facility] on [address], patient is interested in moving to [employer], continues to decline Assisted Living Placement Per patient, her daughter [name] moved her, I snow managing her money and working towards finding her a permanent place to live [employer] for the  Department of [place], office [phone] provided to patient to request possible housing resources  Additional Phone call to [place] worker Steffanie Dunn, VM left requesting a return call regarding patient's housing status Patient provided with a list of [place] contact numbers. Per patient, she has already submitted an application for [employer] This social worker will continue to follow to assist with identifying permanent housing         SDOH assessments and interventions completed:  Yes  SDOH Interventions Today    Flowsheet Row Most Recent Value  SDOH Interventions   Housing Interventions Other (Comment)  [patient active with LCSW, providing housing resources]        Care Coordination Interventions Activated:  No  Care Coordination Interventions:  No, not indicated   Follow up plan: Follow up call scheduled for 12/03/21 10am    Encounter Outcome:  Pt. Visit Completed

## 2021-11-21 ENCOUNTER — Ambulatory Visit: Payer: Self-pay | Admitting: *Deleted

## 2021-11-21 NOTE — Patient Outreach (Signed)
  Care Coordination   Follow Up Visit Note   11/21/2021 Name: Dawn [name] MRN: [id number] DOB: 05/16/1940  Dawn [name] is a 81 y.o. year old female who sees Teodora Medici, DO for primary care. I spoke with  Dawn [name] by phone today.  What matters to the patients health and wellness today?  Level of Care Concerns    Goals Addressed             This Visit's Progress    "I have so many needs"       Care Coordination Interventions:  Phone call from patient and patient's daughter Dawn [name] [phone] [email or web address] now agreeable to [place] Per patient's daughter, independent Living level of care is not an appropriate level of care Patient now staying at the [facility] on [address], however her time there is limited, per patient's daughter there are no availabilities at the [place] placement process discussed, Dawn [name] will need to be updated and will be requested, patient's daughter will also need to assist patient in applying for special assistance Medicaid Contact information for the  Department of Social Services-Adult Protective Services Steffanie Dunn, office [phone] provided to patient to request possible housing resources  Patient's daughter  provided with a preliminary list of Assisted Living Facilities and contact numbers-will e-mail list as well This social worker will continue to follow to assist with identifying permanent housing         SDOH assessments and interventions completed:  No     Care Coordination Interventions Activated:  Yes  Care Coordination Interventions:  Yes, provided   Follow up plan: Follow up call scheduled for 11/22/21    Encounter Outcome:  Pt. Visit Completed

## 2021-11-21 NOTE — Patient Instructions (Signed)
Visit Information  Thank you for taking time to visit with me today. Please don't hesitate to contact me if I can be of assistance to you.   Following are the goals we discussed today:   Goals Addressed             This Visit's Progress    "I have so many needs"       Care Coordination Interventions:  Phone call from patient and patient's daughter [name] [phone] [email or web address] now agreeable to [place] Per patient's daughter, independent Living level of care is not an appropriate level of care Patient now staying at the [facility] on [address], however her time there is limited, per patient's daughter there are no availabilities at the [place] placement process discussed, Phoebe Perch will need to be updated and will be requested, patient's daughter will also need to assist patient in applying for special assistance Medicaid Contact information for the  Department of Social Services-Adult Protective Services Steffanie Dunn, office [phone] provided to patient to request possible housing resources  Patient's daughter  provided with a preliminary list of Assisted Living Facilities and contact numbers-will e-mail list as well This social worker will continue to follow to assist with identifying permanent housing         Our next appointment is by telephone on 11/22/21 at 2:30pm  Please call the care guide team at [phone] if you need to cancel or reschedule your appointment.   If you are experiencing a Mental Health or [place] or need someone to talk to, please call the Suicide and Crisis Lifeline: 988   Patient verbalizes understanding of instructions and care plan provided today and agrees to view in [place]. Active MyChart status and patient understanding of how to access instructions and care plan via MyChart confirmed with patient.     Telephone follow up appointment with care management team member  scheduled for: 11/22/21  Elliot Gurney, [place] Worker  [facility] Care Management [phone]

## 2021-11-22 ENCOUNTER — Ambulatory Visit: Payer: Self-pay | Admitting: *Deleted

## 2021-11-22 NOTE — Patient Instructions (Signed)
Visit Information  Thank you for taking time to visit with me today. Please don't hesitate to contact me if I can be of assistance to you.   Following are the goals we discussed today:   Goals Addressed             This Visit's Progress    "I have so many needs"       Care Coordination Interventions:  Patient's provider contacted, Fl2 requested and faxed to provider's office Once Fl2 is received, the form will be faxed out to local facilities for review Phone call from patient's daughter Sharon,placement processed explained,  encouraged  her to contact the Department of Social Services to discuss special assistance medicaid eligibility List of assisted living facilities securely emailed to daughter [name] [email or web address]  Confirmed that she will be taking patient to  provider's office visit on 11/25/21 This social worker will continue to follow to assist with identifying permanent housing         Our next appointment is by telephone on 11/29/21 at 10am  Please call the care guide team at [phone] if you need to cancel or reschedule your appointment.   If you are experiencing a Mental Health or [place] or need someone to talk to, please call the Suicide and Crisis Lifeline: 988 call 911   Patient verbalizes understanding of instructions and care plan provided today and agrees to view in [place]. Active MyChart status and patient understanding of how to access instructions and care plan via MyChart confirmed with patient.     Telephone follow up appointment with care management team member scheduled for: 11/29/21  Elliot Gurney, [place] Worker  [facility] Care Management [phone]

## 2021-11-22 NOTE — Patient Outreach (Signed)
  Care Coordination   Follow Up Visit Note   11/22/2021 Name: Dawn [name] MRN: [id number] DOB: 05/10/1940  Dawn [name] is a 81 y.o. year old female who sees Teodora Medici, DO for primary care. I spoke with  Dawn [name] by phone today.  What matters to the patients health and wellness today?  Level of Care Concerns    Goals Addressed             This Visit's Progress    "I have so many needs"       Care Coordination Interventions:  Patient's provider contacted, Fl2 requested and faxed to provider's office Once Fl2 is received, the form will be faxed out to local facilities for review Phone call from patient's daughter Dawn [name],placement processed explained,  encouraged  her to contact the Department of Social Services to discuss special assistance medicaid eligibility List of assisted living facilities securely emailed to daughter Dawn [name] [email or web address]  Confirmed that she will be taking patient to  provider's office visit on 11/25/21 This social worker will continue to follow to assist with identifying permanent housing         SDOH assessments and interventions completed:  No     Care Coordination Interventions Activated:  Yes  Care Coordination Interventions:  Yes, provided   Follow up plan: Follow up call scheduled for 11/29/21    Encounter Outcome:  Pt. Visit Completed

## 2021-11-25 ENCOUNTER — Ambulatory Visit (INDEPENDENT_AMBULATORY_CARE_PROVIDER_SITE_OTHER): Payer: Medicare Other | Admitting: Internal Medicine

## 2021-11-25 ENCOUNTER — Encounter: Payer: Self-pay | Admitting: Internal Medicine

## 2021-11-25 VITALS — BP 128/82 | HR 93 | Resp 16 | Ht 63.0 in | Wt 203.5 lb

## 2021-11-25 DIAGNOSIS — F4310 Post-traumatic stress disorder, unspecified: Secondary | ICD-10-CM | POA: Diagnosis not present

## 2021-11-25 DIAGNOSIS — F419 Anxiety disorder, unspecified: Secondary | ICD-10-CM | POA: Diagnosis not present

## 2021-11-25 DIAGNOSIS — Z8719 Personal history of other diseases of the digestive system: Secondary | ICD-10-CM

## 2021-11-25 DIAGNOSIS — E1140 Type 2 diabetes mellitus with diabetic neuropathy, unspecified: Secondary | ICD-10-CM | POA: Diagnosis not present

## 2021-11-25 DIAGNOSIS — R32 Unspecified urinary incontinence: Secondary | ICD-10-CM

## 2021-11-25 DIAGNOSIS — F331 Major depressive disorder, recurrent, moderate: Secondary | ICD-10-CM

## 2021-11-25 LAB — POCT URINALYSIS DIPSTICK
Bilirubin, UA: NEGATIVE
Blood, UA: NEGATIVE
Glucose, UA: NEGATIVE
Ketones, UA: NEGATIVE
Leukocytes, UA: NEGATIVE
Nitrite, UA: NEGATIVE
Odor: NORMAL
Protein, UA: NEGATIVE
Spec Grav, UA: 1.02 (ref 1.010–1.025)
Urobilinogen, UA: 0.2 E.U./dL
pH, UA: 6.5 (ref 5.0–8.0)

## 2021-11-25 LAB — POCT GLYCOSYLATED HEMOGLOBIN (HGB A1C): Hemoglobin A1C: 6.5 % — AB (ref 4.0–5.6)

## 2021-11-25 MED ORDER — PANTOPRAZOLE SODIUM 40 MG PO TBEC: 40.0000 mg | DELAYED_RELEASE_TABLET | Freq: Every day | ORAL | 1 refills | Status: AC

## 2021-11-25 MED ORDER — ARIPIPRAZOLE 5 MG PO TABS: 5.0000 mg | ORAL_TABLET | Freq: Every day | ORAL | 0 refills | Status: DC

## 2021-11-25 MED ORDER — HYDROXYZINE HCL 10 MG PO TABS: 10.0000 mg | ORAL_TABLET | Freq: Every day | ORAL | 0 refills | Status: DC | PRN

## 2021-11-25 NOTE — Progress Notes (Signed)
Established Patient Office Visit  Subjective   Patient ID: Dawn [name], female    DOB: 06/23/1940  Age: 81 y.o. MRN: [id number]  Chief Complaint  Patient presents with   Follow-up    HPI Patient is here for follow up. Patient is here with her daughter.  Unfortunately she was recently evicted from her apartment.  We had tried previously to get her into assisted living, however this fell through.  She is now living at the [facility].  She states she is doing well better overall and feels safe, however her anxiety and stress levels have been exacerbated by this.  MDD: -Mood status: exacerbated  -Current treatment: Effexor XR 150 in the am and 75 XR in the pm, Abilify 5 mg and Hydroxyzine PRN -Satisfied with current treatment?: yes -Symptom severity: severe -Duration of current treatment : years; Abilify for the last couple weeks -Side effects: no Medication compliance: Fair compliance, not taking Abilify daily Psychotherapy/counseling: no in the past, has spoken to social worker about it   Depressed mood: yes Anxious mood: yes Denies visual or auditory hallucinations today Denies paranoid or delusional thinking today       11/25/2021    3:41 PM 11/05/2021   12:04 PM 10/08/2021   10:48 AM 09/03/2021   10:05 AM 07/22/2021    9:21 AM  Depression screen PHQ 2/9  Decreased Interest 0 3 0 0 0  Down, Depressed, Hopeless 1 3 0 3 0  PHQ - 2 Score 1 6 0 3 0  Altered sleeping 0 2 0 3   Tired, decreased energy 0 3 0 3   Change in appetite 0 0 0 2   Feeling bad or failure about yourself  0 1 0 3   Trouble concentrating 0 1 0 3   Moving slowly or fidgety/restless 0 1 0 0   Suicidal thoughts 0 0 0 0   PHQ-9 Score 1 14 0 17   Difficult doing work/chores Not difficult at all Very difficult Not difficult at all Very difficult    Hypertension/CHF: -Medications: None, was on a beta blocker previously for about 1 year but ran out of it. No side effects. Had taken Lasix in the past but became  really shaky and tremulous  -Does not check blood pressure at home  -Denies chest pain, shortness of breath, dizziness, leg swelling  Diabetes, Type 2: -Last A1c 6.7% 6/23 -Medications: Metformin 500 mg  - causing drops in sugar/shakiness, taking irregularly  -Checking sugars: <200 fasting  -Foot exam: Due today -Microalbumin: Due today -Statin: No -PNA vaccine: Up-to-date -Denies symptoms of hypoglycemia, polyuria, polydipsia, foot ulcers/trauma.   Also complaining of urinary incontinence since her infection.  She states she will lose all control of her bladder.  This happens a few times a week.  She denies burning with urination.  She does endorse increased urinary urgency and frequency but denies hematuria, fevers, abdominal or flank pain.   Health Maintenance: -Blood work UTD -Mammogram/DEXA not interested in screening at this time  Review of Systems  Constitutional:  Negative for chills and fever.  Eyes:  Negative for blurred vision.  Respiratory:  Negative for shortness of breath.   Cardiovascular:  Negative for chest pain.  Gastrointestinal:  Negative for abdominal pain, blood in stool, nausea and vomiting.  Genitourinary:  Positive for frequency and urgency. Negative for dysuria, flank pain and hematuria.  Neurological:  Negative for headaches.  All other systems reviewed and are negative.  Objective:     BP 128/82   Pulse 93   Resp 16   Ht 5' 3"  (1.6 m)   Wt 203 lb 8 oz (92.3 kg)   SpO2 97%   BMI 36.05 kg/m  BP Readings from Last 3 Encounters:  11/25/21 128/82  10/08/21 (!) 144/72  07/01/21 136/84   Wt Readings from Last 3 Encounters:  11/25/21 203 lb 8 oz (92.3 kg)  10/08/21 203 lb (92.1 kg)  07/01/21 203 lb 4.8 oz (92.2 kg)      Physical Exam Constitutional:      Appearance: Normal appearance.  HENT:     Head: Normocephalic and atraumatic.  Eyes:     Conjunctiva/sclera: Conjunctivae normal.  Cardiovascular:     Rate and Rhythm: Normal rate  and regular rhythm.     Pulses:          Dorsalis pedis pulses are 2+ on the right side and 2+ on the left side.  Pulmonary:     Effort: Pulmonary effort is normal.     Breath sounds: Normal breath sounds.  Musculoskeletal:     Right foot: Normal range of motion. No deformity, bunion, Charcot foot, foot drop or prominent metatarsal heads.     Left foot: Normal range of motion. No deformity, bunion, Charcot foot, foot drop or prominent metatarsal heads.  Feet:     Right foot:     Protective Sensation: 6 sites tested.  6 sites sensed.     Skin integrity: Skin integrity normal.     Toenail Condition: Right toenails are normal.     Left foot:     Protective Sensation: 6 sites tested.  6 sites sensed.     Skin integrity: Skin integrity normal.     Toenail Condition: Left toenails are normal.  Skin:    General: Skin is warm and dry.  Neurological:     General: No focal deficit present.     Mental Status: She is alert. Mental status is at baseline.  Psychiatric:        Mood and Affect: Mood normal.        Behavior: Behavior normal.     Results for orders placed or performed in visit on 11/25/21  POCT HgB A1C  Result Value Ref Range   Hemoglobin A1C 6.5 (A) 4.0 - 5.6 %   HbA1c POC (<> result, manual entry)     HbA1c, POC (prediabetic range)     HbA1c, POC (controlled diabetic range)    POCT Urinalysis Dipstick  Result Value Ref Range   Color, UA yellow    Clarity, UA clear    Glucose, UA Negative Negative   Bilirubin, UA neg    Ketones, UA neg    Spec Grav, UA 1.020 1.010 - 1.025   Blood, UA neg    pH, UA 6.5 5.0 - 8.0   Protein, UA Negative Negative   Urobilinogen, UA 0.2 0.2 or 1.0 E.U./dL   Nitrite, UA neg    Leukocytes, UA Negative Negative   Appearance clear    Odor normal     Last CBC Lab Results  Component Value Date   WBC 13.8 (H) 07/01/2021   HGB 14.2 07/01/2021   HCT 42.4 07/01/2021   MCV 93.8 07/01/2021   MCH 31.4 07/01/2021   RDW 12.4 07/01/2021    PLT 343 83/38/2505   Last metabolic panel Lab Results  Component Value Date   GLUCOSE 172 (H) 07/01/2021   NA 135 07/01/2021   K  5.5 (H) 07/01/2021   CL 100 07/01/2021   CO2 24 07/01/2021   BUN 19 07/01/2021   CREATININE 0.99 (H) 07/01/2021   EGFR 58 (L) 07/01/2021   CALCIUM 9.5 07/01/2021   PROT 6.9 07/01/2021   ALBUMIN 3.6 06/03/2021   BILITOT 0.5 07/01/2021   ALKPHOS 225 (H) 06/03/2021   AST 24 07/01/2021   ALT 32 (H) 07/01/2021   ANIONGAP 9 06/03/2021   Last lipids Lab Results  Component Value Date   CHOL 249 (H) 07/01/2021   HDL 71 07/01/2021   LDLCALC 145 (H) 07/01/2021   TRIG 193 (H) 07/01/2021   CHOLHDL 3.5 07/01/2021   Last hemoglobin A1c Lab Results  Component Value Date   HGBA1C 6.5 (A) 11/25/2021   Last thyroid functions Lab Results  Component Value Date   TSH 2.44 07/01/2021   Last vitamin D Lab Results  Component Value Date   VD25OH 34 07/01/2021   Last vitamin B12 and Folate Lab Results  Component Value Date   VITAMINB12 356 07/01/2021   FOLATE 14.3 07/01/2021      The ASCVD Risk score (Arnett DK, et al., 2019) failed to calculate for the following reasons:   The 2019 ASCVD risk score is only valid for ages 68 to 54    Assessment & Plan:   1. PTSD (post-traumatic stress disorder)/Moderate episode of recurrent major depressive disorder (HCC)/ Anxiety: Working with Education officer, museum with living situation, forms filled out to try to get her into assisted living.  Continue current medications, Effexor Exar 150 in the morning and 75 mg in the evening and Abilify 5 mg daily.  Discussed with the patient the importance of taking these medications regularly.  We will also use hydroxyzine as needed for anxiety and insomnia.  Abilify and hydroxyzine refilled today.  - ARIPiprazole (ABILIFY) 5 MG tablet; Take 1 tablet (5 mg total) by mouth daily.  Dispense: 90 tablet; Refill: 0 - hydrOXYzine (ATARAX) 10 MG tablet; Take 1 tablet (10 mg total) by mouth  daily as needed for anxiety.  Dispense: 90 tablet; Refill: 0  3. Type 2 diabetes mellitus with diabetic neuropathy, without long-term current use of insulin ([place]): A1c in the office today improved to 6.5%.  Continue metformin 500 mg once daily.  Microalbumin today, foot exam today.  - POCT HgB A1C - Urine Microalbumin w/creat. ratio - HM Diabetes Foot Exam  4. History of GI bleed: Stable, was out of medication for the last week and having some breakthrough symptoms but no abnormal stools or blood in the stool.  Continue pantoprazole 40 mg daily, refilled today.  - pantoprazole (PROTONIX) 40 MG tablet; Take 1 tablet (40 mg total) by mouth daily.  Dispense: 90 tablet; Refill: 1  5. Urinary incontinence, unspecified type: UA in the office today negative for acute infection, incontinence [place] due to increased stress and overactive bladder.  Discussed limiting liquids before bed.  - POCT Urinalysis Dipstick   Return in about 4 weeks (around 12/23/2021).    Teodora Medici, DO

## 2021-11-25 NOTE — Patient Instructions (Addendum)
It was great seeing you today!  Plan discussed at today's visit: -A1c improved to 6.5% -Refills sent to pharmacy -Take Hydroxyzine as needed for anxiety and sleep -Urine today normal  Follow up in: 1 month   Take care and let us know if you have any questions or concerns prior to your next visit.  Dr. Rosana Berger

## 2021-11-26 ENCOUNTER — Ambulatory Visit: Payer: Self-pay | Admitting: *Deleted

## 2021-11-26 LAB — MICROALBUMIN / CREATININE URINE RATIO
Creatinine, Urine: 102 mg/dL (ref 20–275)
Microalb Creat Ratio: 24 mcg/mg creat (ref <30)
Microalb, Ur: 2.4 mg/dL

## 2021-11-26 NOTE — Patient Instructions (Signed)
Visit Information  Thank you for taking time to visit with me today. Please don't hesitate to contact me if I can be of assistance to you.   Following are the goals we discussed today:   Goals Addressed             This Visit's Progress    "I have so many needs"       Care Coordination Interventions:  Phone call from patient's daughter-confirmed that the [facility] has been received and will be faxed out to local facilities Per patient's daughter, she has made several attempts to contact [facility] for shelter care, however they have not beds available List of Assisted Living facilities along with the contact number for the Department of Social Services securely emailed to [email or web address] Contacting  the Department of Social Services encouraged to discuss special assistance medicaid eligibility Confirmed that patient's daughter did accompany patient  to  her provider's office visit on 11/25/21 This social worker will continue to follow to assist with identifying permanent housing         Our next appointment is by telephone on 11/29/21 at 10am  Please call the care guide team at [phone] if you need to cancel or reschedule your appointment.   If you are experiencing a Mental Health or [place] or need someone to talk to, please call the Suicide and Crisis Lifeline: 988 call 911   Patient verbalizes understanding of instructions and care plan provided today and agrees to view in [place]. Active MyChart status and patient understanding of how to access instructions and care plan via MyChart confirmed with patient.     Telephone follow up appointment with care management team member scheduled for: 11/29/21  12/12/21, [place] Worker  [facility] Care Management [phone]

## 2021-11-26 NOTE — Patient Outreach (Addendum)
  Care Coordination   Multidisciplinary Case Review Note    11/26/2021 Name: Dawn [name] MRN: [id number] DOB: [birth year]  Dawn [name] is a 81 y.o. year old female who sees Teodora Medici, DO for primary care.  The  multidisciplinary care team met on 11/21/21 to review patient care needs and barriers.     Goals Addressed             This Visit's Progress    "I have so many needs"       Care Coordination Interventions:  Mutli-disciplinary Case discussion completed on 11/21/21 Patient has been evicted and now living at the [facility] Patient now willing to consider ALF Patient's provider contacted, Fl2 requested and faxed to provider's office Once Fl2 is received, the form will be faxed out to local facilities for review Phone call from patient's daughter Sharon,placement processed explained,  encouraged  her to contact the Department of Social Services to discuss special assistance medicaid eligibility List of assisted living facilities securely emailed to daughter Karenann Cai  Confirmed that she will be taking patient to  provider's office visit on 11/25/21 This social worker will continue to follow to assist with identifying permanent housing         SDOH assessments and interventions completed:  No     Care Coordination Interventions Activated:  Yes   Care Coordination Interventions:  Yes, provided   Follow up plan: Follow up call scheduled for 11/29/21    Multidisciplinary Team Attendees:   [place], LCSW Noreene Larsson, RN  Scribe for Multidisciplinary Case Review:  [employer]

## 2021-11-26 NOTE — Patient Outreach (Signed)
  Care Coordination   Follow Up Visit Note   11/26/2021 Name: Dawn [name] MRN: [id number] DOB: [birth year]  Dawn [name] is a 81 y.o. year old female who sees Teodora Medici, DO for primary care. I spoke with  Dawn [name]'s daughter Dawn [name] by phone today.  What matters to the patients health and wellness today?  Housing    Goals Addressed             This Visit's Progress    "I have so many needs"       Care Coordination Interventions:  Phone call from patient's daughter-confirmed that the [facility] has been received and will be faxed out to local facilities Per patient's daughter, she has made several attempts to contact [facility] for shelter care, however they have not beds available List of Assisted Living facilities along with the contact number for the Department of Social Services securely emailed to [email or web address] Contacting  the Department of Social Services encouraged to discuss special assistance medicaid eligibility Confirmed that patient's daughter did accompany patient  to  her provider's office visit on 11/25/21 This social worker will continue to follow to assist with identifying permanent housing         SDOH assessments and interventions completed:  No     Care Coordination Interventions Activated:  Yes  Care Coordination Interventions:  Yes, provided   Follow up plan: Follow up call scheduled for 11/29/21    Encounter Outcome:  Pt. Visit Completed

## 2021-11-27 ENCOUNTER — Ambulatory Visit: Payer: Self-pay | Admitting: *Deleted

## 2021-11-27 NOTE — Patient Outreach (Addendum)
  Care Coordination   Follow Up Visit Note   11/27/2021 Name: Dawn [name] MRN: [id number] DOB: [birth year]  Dawn [name] is a 81 y.o. year old female who sees Teodora Medici, DO for primary care. I spoke with  Assisted Living Facilities by phone today.  What matters to the patients health and wellness today?  Level of Care Needs    Goals Addressed             This Visit's Progress    "I have so many needs"       Care Coordination Interventions:  FL2 has been received and faxed to [place], [facility], and [employer] requesting additional clinical information, payor source and contact information for POA          SDOH assessments and interventions completed:  No     Care Coordination Interventions Activated:  Yes  Care Coordination Interventions:  Yes, provided   Follow up plan: Follow up call scheduled for 11/29/21    Encounter Outcome:  Pt. Visit Completed

## 2021-11-27 NOTE — Patient Instructions (Signed)
.  care 

## 2021-11-28 ENCOUNTER — Ambulatory Visit: Payer: Self-pay | Admitting: *Deleted

## 2021-11-28 NOTE — Patient Outreach (Signed)
  Care Coordination   Follow Up Visit Note   11/28/2021 Name: Dawn [name] MRN: [id number] DOB: 1940-08-12  Dawn [name] is a 81 y.o. year old female who sees Teodora Medici, DO for primary care. I  spoke to patient's daughter and Admissions Director for [employer]  What matters to the patients health and wellness today?  Level of Care concerns    Goals Addressed             This Visit's Progress    "I have so many needs"       Care Coordination Interventions:  Confirmed that [facility] has a bed for patient, Admissions Director Dawn [name] would need to speak to patient's family before official bed offer can be made Phone call to patient's daughter Dawn [name], discussed need to follow up with medicaid transferring to special assistance medicaid, contact information for Admissions Director provided to patient's daughter  This Education officer, museum to continue to follow           SDOH assessments and interventions completed:  No     Care Coordination Interventions Activated:  Yes  Care Coordination Interventions:  Yes, provided   Follow up plan: Follow up call scheduled for 11/28/21    Encounter Outcome:  Pt. Visit Completed

## 2021-11-28 NOTE — Patient Instructions (Signed)
Visit Information  Thank you for taking time to visit with me today. Please don't hesitate to contact me if I can be of assistance to you.   Following are the goals we discussed today:   Goals Addressed             This Visit's Progress    "I have so many needs"       Care Coordination Interventions:  Confirmed that [name] has a bed for patient, Admissions Director Warner Mccreedy would need to speak to patient's family before official bed offer can be made Phone call to patient's daughter Dawn [name], discussed need to follow up with medicaid transferring to special assistance medicaid, contact information for Admissions Director provided to patient's daughter  This social worker to continue to follow           Our next appointment is by telephone on 11/29/21 at 10am  Please call the care guide team at [phone] if you need to cancel or reschedule your appointment.   If you are experiencing a Mental Health or [place] or need someone to talk to, please call the Suicide and Crisis Lifeline: 988 call 911   Patient verbalizes understanding of instructions and care plan provided today and agrees to view in [place]. Active MyChart status and patient understanding of how to access instructions and care plan via MyChart confirmed with patient.     Telephone follow up appointment with care management team member scheduled for: 11/29/21  Elliot Gurney, [place] Worker  [facility] Care Management [phone]

## 2021-11-29 ENCOUNTER — Ambulatory Visit: Payer: Self-pay | Admitting: *Deleted

## 2021-11-30 NOTE — Patient Outreach (Signed)
  Care Coordination   Follow Up Visit Note   11/30/2021 Name: Dawn [name] MRN: [id number] DOB: [birth year]  Dawn [name] is a 81 y.o. year old female who sees Teodora Medici, DO for primary care. I spoke with  Dawn [name]'s daughter Dawn [name] by phone today.  What matters to the patients health and wellness today?  Level of Care concerns    Goals Addressed             This Visit's Progress    "I have so many needs"       Care Coordination Interventions:  Confirmed that [facility] has a bed for patient, Admissions Director Warner Mccreedy would need to speak to patient's family before official bed offer can be made [facility] also has a bed available, additional clinical provided to [place] Patient's daughter provided with the contact information for both [facility] and [facility] to discuss possible admission with the Admission Directors Placement process discussed, reinforced need to contact the admission directors to get a final decision as well as the Department of Social Services to apply for Special Assistance Medicaid Tour with [place] scheduled for 12/05/21 at 2pm This Education officer, museum to continue to follow to assist with ALF placement           SDOH assessments and interventions completed:  No     Care Coordination Interventions Activated:  Yes  Care Coordination Interventions:  Yes, provided   Follow up plan: Follow up call scheduled for 12/09/21    Encounter Outcome:  Pt. Visit Completed

## 2021-11-30 NOTE — Patient Outreach (Deleted)
  Care Coordination   Follow Up Visit Note   11/30/2021 Name: Talar M Martinique MRN: [id number] DOB: 1941/01/02  Alexas M Martinique is a 81 y.o. year old female who sees Teodora Medici, DO for primary care. I spoke with  Panzy M Martinique daughter [name] by phone today.  What matters to the patients health and wellness today?  Level of care needs    Goals Addressed             This Visit's Progress    "I have so many needs"       Care Coordination Interventions:  Confirmed that [facility] has a bed for patient, Admissions Director Warner Mccreedy would need to speak to patient's family before official bed offer can be made [facility] also has a bed available, additional clinical provided to [place] Patient's daughter provided with the contact information for both [facility] and [facility] to discuss possible admission with the Admission Directors Placement process discussed, reinforced need to contact the admission directors to get a final decision as well as the Department of Social Services to apply for Special Assistance Medicaid Tour with [place] scheduled for 01/05/22 at 2pm This Education officer, museum to continue to follow to assist with ALF placement           SDOH assessments and interventions completed:  No     Care Coordination Interventions Activated:  Yes  Care Coordination Interventions:  Yes, provided   Follow up plan: Follow up call scheduled for 12/06/21    Encounter Outcome:  Pt. Visit Completed

## 2021-11-30 NOTE — Patient Instructions (Addendum)
Visit Information  Thank you for taking time to visit with me today. Please don't hesitate to contact me if I can be of assistance to you.   Following are the goals we discussed today:   Goals Addressed             This Visit's Progress    "I have so many needs"       Care Coordination Interventions:  Confirmed that [facility] [place] has a bed for patient, Admissions Director Warner Mccreedy would need to speak to patient's family before official bed offer can be made [facility] also has a bed available, additional clinical provided to [place] Patient's daughter provided with the contact information for both [place] and [facility] to discuss possible admission with the Admission Directors Placement process discussed, reinforced need to contact the admission directors to get a final decision as well as the Department of Social Services to apply for Special Assistance Medicaid Tour with [place] scheduled for 12/05/21 at 2pm This social worker to continue to follow to assist with ALF placement           Our next appointment is by telephone on 12/06/21 at 11am  Please call the care guide team at [phone] if you need to cancel or reschedule your appointment.   If you are experiencing a Mental Health or [place] or need someone to talk to, please call the Suicide and Crisis Lifeline: 988 call 911   Patient verbalizes understanding of instructions and care plan provided today and agrees to view in [place]. Active MyChart status and patient understanding of how to access instructions and care plan via MyChart confirmed with patient.     Telephone follow up appointment with care management team member scheduled for: 12/06/21  Elliot Gurney, [place] Worker  [facility] Care Management [phone]

## 2021-12-03 ENCOUNTER — Encounter: Payer: Self-pay | Admitting: *Deleted

## 2021-12-03 ENCOUNTER — Ambulatory Visit: Payer: Self-pay | Admitting: *Deleted

## 2021-12-03 NOTE — Patient Outreach (Signed)
  Care Coordination   Level of Care coordination Visit Note   12/03/2021 Name: Amira M Martinique MRN: [id number] DOB: 1940-11-19  Rainy M Martinique is a 81 y.o. year old female who sees Teodora Medici, DO for primary care. I  engaged with [employer] today regarding possible placement  What matters to the patients health and wellness today?  Housing   Goals Addressed             This Visit's Progress    "I have so many needs"       Care Coordination Interventions:  [facility] requesting that the Fl2 and TB test result be provided Fl2 re-sent , TB test will need to be ordered Contact information provided to patient's daughter [name] for continued  placement coordination            SDOH assessments and interventions completed:  No     Care Coordination Interventions Activated:  Yes  Care Coordination Interventions:  Yes, provided   Follow up plan: Follow up call scheduled for 12/06/21    Encounter Outcome:  Pt. Visit Completed

## 2021-12-04 ENCOUNTER — Ambulatory Visit: Payer: Self-pay | Admitting: *Deleted

## 2021-12-04 NOTE — Patient Outreach (Addendum)
  Care Coordination   Multidisciplinary Case Review Note    12/04/2021 Name: Dawn [name] MRN: [id number] DOB: 08-Jun-1940  Dawn [name] is a 81 y.o. year old female who sees Dawn Medici, DO for primary care.  The  multidisciplinary care team met on 11/28/21 to review patient care needs and barriers.     Goals Addressed             This Visit's Progress    "I have so many needs"       Care Coordination Interventions:  Status Transition to [place]  Phone call from patient's daughter to confirm that she has a tour scheduled for 12/05/21 at [facility] Patient's daughter having trouble reaching the Department of Social Services to apply for special assistance medicaid-she is agreeable to presenting to the Department of Social Services on 12/05/21 CSW made attempt to contact the Department of social services on patient behalf regarding the special assistance medicaid-VM left requesting a return call [phone]           SDOH assessments and interventions completed:  No     Care Coordination Interventions Activated:  Yes   Care Coordination Interventions:  Yes, provided   Follow up plan: Follow up call scheduled for 12/05/21    Multidisciplinary Team Attendees:   [place], LCSW Dawn Catholic, RN Dawn Holts RN  Scribe for Multidisciplinary Case Review:  Dawn Kissoon, LCSW

## 2021-12-04 NOTE — Patient Outreach (Addendum)
  Care Coordination   Follow Up Visit Note   12/04/2021 Name: Samaria M Martinique MRN: [id number] DOB: 02/15/1941  Congetta M Martinique is a 81 y.o. year old female who sees Teodora Medici, DO for primary care. I spoke with  Jany M Martinique daughter [name] by phone today.  What matters to the patients health and wellness today?  Level of Care concerns    Goals Addressed             This Visit's Progress    "I have so many needs"       Care Coordination Interventions:  Phone call from patient's daughter to confirm that she has a tour scheduled for 12/05/21 at [facility] Patient's daughter having trouble reaching the Department of Social Services to apply for special assistance medicaid-she is agreeable to presenting to the Department of Social Services on 12/05/21 CSW made attempt to contact the Department of social services on patient behalf regarding the special assistance medicaid-VM left requesting a return call [phone]           SDOH assessments and interventions completed:  No     Care Coordination Interventions Activated:  Yes  Care Coordination Interventions:  Yes, provided   Follow up plan: Follow up call scheduled for 12/06/21    Encounter Outcome:  Pt. Visit Completed

## 2021-12-04 NOTE — Patient Instructions (Addendum)
Visit Information  Thank you for taking time to visit with me today. Please don't hesitate to contact me if I can be of assistance to you.   Following are the goals we discussed today:   Goals Addressed             This Visit's Progress    "I have so many needs"       Care Coordination Interventions:  Phone call from patient's daughter to confirm that she has a tour scheduled for 12/05/21 at [facility] Patient's daughter having trouble reaching the Department of Social Services to apply for special assistance medicaid-she is agreeable to presenting to the Department of Social Services on 12/05/21 CSW made attempt to contact the Department of social services on patient behalf regarding the special assistance medicaid-VM left requesting a return call [phone]           Our next appointment is by telephone on 12/06/21 at 10am  Please call the care guide team at [phone] if you need to cancel or reschedule your appointment.   If you are experiencing a Mental Health or [place] or need someone to talk to, please call the Suicide and Crisis Lifeline: 988 call 911   Patient verbalizes understanding of instructions and care plan provided today and agrees to view in [place]. Active MyChart status and patient understanding of how to access instructions and care plan via MyChart confirmed with patient.     Telephone follow up appointment with care management team member scheduled for: 12/06/21  Elliot Gurney, [place] Worker  [facility] Care Management [phone]

## 2021-12-05 ENCOUNTER — Ambulatory Visit: Payer: Self-pay | Admitting: *Deleted

## 2021-12-05 NOTE — Patient Instructions (Signed)
Visit Information  Thank you for taking time to visit with me today. Please don't hesitate to contact me if I can be of assistance to you.   Following are the goals we discussed today:   Goals Addressed             This Visit's Progress    "I have so many needs"       Care Coordination Interventions:  Status of Transition to [place] discussed Patient's daughter reports that they toured [place] and feel that it would be a good fit Daughter is now at the [employer] completing application for special assistance medicaid-fl2 requested and faxed Next meeting with [facility] to complete admission paper work is 12/18/21            Our next appointment is by telephone on 12/19/21 at 10am  Please call the care guide team at [phone] if you need to cancel or reschedule your appointment.   If you are experiencing a Mental Health or [place] or need someone to talk to, please call the Suicide and Crisis Lifeline: 988 call 911   Patient verbalizes understanding of instructions and care plan provided today and agrees to view in [place]. Active MyChart status and patient understanding of how to access instructions and care plan via MyChart confirmed with patient.     Telephone follow up appointment with care management team member scheduled for: 12/19/21  Elliot Gurney, [place] Worker [facility] Care Management [phone]

## 2021-12-05 NOTE — Patient Outreach (Signed)
  Care Coordination   Follow Up Visit Note   12/05/2021 Name: Dawn [name] MRN: [id number] DOB: 07-17-1940  Dawn [name] is a 81 y.o. year old female who sees Teodora Medici, DO for primary care. I spoke with  Dawn [name] by phone today.  What matters to the patients health and wellness today?  Housing    Goals Addressed             This Visit's Progress    "I have so many needs"       Care Coordination Interventions:  Status of Transition to [place] discussed Patient's daughter reports that they toured [place] and feel that it would be a good fit Daughter is now at the [facility] completing application for special assistance medicaid-fl2 requested and faxed Next meeting with [facility] to complete admission paper work is 12/18/21            SDOH assessments and interventions completed:  No     Care Coordination Interventions Activated:  Yes  Care Coordination Interventions:  Yes, provided   Follow up plan: Follow up call scheduled for 12/19/21    Encounter Outcome:  Pt. Visit Completed

## 2021-12-06 ENCOUNTER — Encounter: Payer: Self-pay | Admitting: *Deleted

## 2021-12-17 ENCOUNTER — Telehealth: Payer: Self-pay | Admitting: Internal Medicine

## 2021-12-17 NOTE — Telephone Encounter (Signed)
Copied from [place] [id number]. Topic: General - Other >> Dec 17, 2021 12:31 PM Jacinto Reap M wrote: Reason for CRM: Pt requests that Roselyn Reef return her call. Cb# [phone]

## 2021-12-17 NOTE — Telephone Encounter (Signed)
Tried to call vm full

## 2021-12-19 ENCOUNTER — Ambulatory Visit: Payer: Self-pay | Admitting: *Deleted

## 2021-12-19 NOTE — Patient Instructions (Signed)
Visit Information  Thank you for taking time to visit with me today. Please don't hesitate to contact me if I can be of assistance to you.   Following are the goals we discussed today:   Goals Addressed             This Visit's Progress    "I have so many needs"       Care Coordination Interventions:  Status of Transition to [place] discussed Patient's daughter reports despite pushback, patient will arrive today to [facility] at 1pm Confirmed that patient will be able to be followed by a psychiatrist for medication management while there as well Patient's daughter reports having no additional community resource needs at this time          If you are experiencing a [place] or [place] or need someone to talk to, please call the Suicide and Crisis Lifeline: 988 call 911   Patient verbalizes understanding of instructions and care plan provided today and agrees to view in [place]. Active MyChart status and patient understanding of how to access instructions and care plan via MyChart confirmed with patient.     No further follow up required: patient to transition to [place] today at [facility], [place] Worker  [facility] Care Management [phone]

## 2021-12-19 NOTE — Patient Outreach (Signed)
  Care Coordination   Follow Up Visit Note   12/19/2021 Name: Dawn [name] MRN: [id number] DOB: 10-26-1940  Dawn [name] is a 81 y.o. year old female who sees Teodora Medici, DO for primary care. I spoke with  Dawn [name] by phone today.  What matters to the patients health and wellness today?  Level of Care concerns    Goals Addressed             This Visit's Progress    "I have so many needs"       Care Coordination Interventions:  Status of Transition to [place] discussed Patient's daughter reports despite pushback, patient will arrive today to [place] at 1pm Confirmed that patient will be able to be followed by a psychiatrist for medication management while there as well Patient's daughter reports having no additional community resource needs at this time           SDOH assessments and interventions completed:  No     Care Coordination Interventions Activated:  Yes  Care Coordination Interventions:  Yes, provided   Follow up plan: No further intervention required.   Encounter Outcome:  Pt. Visit Completed

## 2022-01-15 ENCOUNTER — Ambulatory Visit: Payer: Self-pay | Admitting: *Deleted

## 2022-01-15 NOTE — Telephone Encounter (Signed)
Summary: medication dosage increase   Pt requesting an increase of hydrOXYzine (ATARAX) 10 MG tablet to 3xd until after the holidays  Please fu w/ pt         Reason for Disposition  [1] Caller has NON-URGENT medicine question about med that PCP prescribed AND [2] triager unable to answer question  Answer Assessment - Initial Assessment Questions 1. NAME of MEDICINE: "What medicine(s) are you calling about?"     Hydroxyzine 10 mg 2. QUESTION: "What is your question?" (e.g., double dose of medicine, side effect)     Patient is wanting to increase her medication during the holidays- 2/day and 1 extra if needed. 3. PRESCRIBER: "Who prescribed the medicine?" Reason: if prescribed by specialist, call should be referred to that group.     PCP 4. SYMPTOMS: "Do you have any symptoms?" If Yes, ask: "What symptoms are you having?"  "How bad are the symptoms (e.g., mild, moderate, severe)     Increased stress during holiday- seems to be shaking if she gets excited/worried- Patient would like to increase her hydroxyzine if PCP will allow. Patient states she is retaining fluid if she doesn't rest- patient is watching her ankles and elevating when needs. Patient would like to have an Rx for Magnesium gummies on file- she uses them for constipation as needed- 2-3/day. She does not know dosing- she does not have them on hand- but the staff at  [facility] like to have anything a patient takes on medication list. Update: Patient is now at [facility]- she is much happier and stable. Patient states she is in independent living and doing well- patient hopes to get her own apartment soon. Patient is due for office follow up and I did schedule her for next week.   [facility]  Protocols used: Medication Question Call-A-AH

## 2022-01-16 NOTE — Telephone Encounter (Signed)
Left detailed vm °

## 2022-01-23 ENCOUNTER — Ambulatory Visit: Payer: Medicare Other | Admitting: Internal Medicine

## 2022-02-18 DIAGNOSIS — I10 Essential (primary) hypertension: Secondary | ICD-10-CM | POA: Diagnosis not present

## 2022-02-18 DIAGNOSIS — G8929 Other chronic pain: Secondary | ICD-10-CM | POA: Diagnosis not present

## 2022-02-18 DIAGNOSIS — E119 Type 2 diabetes mellitus without complications: Secondary | ICD-10-CM | POA: Diagnosis not present

## 2022-02-18 DIAGNOSIS — R748 Abnormal levels of other serum enzymes: Secondary | ICD-10-CM | POA: Diagnosis not present

## 2022-02-20 DIAGNOSIS — F03918 Unspecified dementia, unspecified severity, with other behavioral disturbance: Secondary | ICD-10-CM | POA: Diagnosis not present

## 2022-02-20 DIAGNOSIS — F419 Anxiety disorder, unspecified: Secondary | ICD-10-CM | POA: Diagnosis not present

## 2022-02-20 DIAGNOSIS — F32A Depression, unspecified: Secondary | ICD-10-CM | POA: Diagnosis not present

## 2022-02-20 DIAGNOSIS — E663 Overweight: Secondary | ICD-10-CM | POA: Diagnosis not present

## 2022-02-20 DIAGNOSIS — R69 Illness, unspecified: Secondary | ICD-10-CM | POA: Diagnosis not present

## 2022-02-27 DIAGNOSIS — E663 Overweight: Secondary | ICD-10-CM | POA: Diagnosis not present

## 2022-02-27 DIAGNOSIS — F32A Depression, unspecified: Secondary | ICD-10-CM | POA: Diagnosis not present

## 2022-02-27 DIAGNOSIS — F5105 Insomnia due to other mental disorder: Secondary | ICD-10-CM | POA: Diagnosis not present

## 2022-02-27 DIAGNOSIS — R69 Illness, unspecified: Secondary | ICD-10-CM | POA: Diagnosis not present

## 2022-02-27 DIAGNOSIS — F419 Anxiety disorder, unspecified: Secondary | ICD-10-CM | POA: Diagnosis not present

## 2022-02-27 DIAGNOSIS — F03918 Unspecified dementia, unspecified severity, with other behavioral disturbance: Secondary | ICD-10-CM | POA: Diagnosis not present

## 2022-03-10 DIAGNOSIS — E782 Mixed hyperlipidemia: Secondary | ICD-10-CM | POA: Diagnosis not present

## 2022-03-10 DIAGNOSIS — D518 Other vitamin B12 deficiency anemias: Secondary | ICD-10-CM | POA: Diagnosis not present

## 2022-03-10 DIAGNOSIS — Z79899 Other long term (current) drug therapy: Secondary | ICD-10-CM | POA: Diagnosis not present

## 2022-03-10 DIAGNOSIS — E038 Other specified hypothyroidism: Secondary | ICD-10-CM | POA: Diagnosis not present

## 2022-03-10 DIAGNOSIS — E119 Type 2 diabetes mellitus without complications: Secondary | ICD-10-CM | POA: Diagnosis not present

## 2022-03-14 DIAGNOSIS — J449 Chronic obstructive pulmonary disease, unspecified: Secondary | ICD-10-CM | POA: Diagnosis not present

## 2022-03-15 DIAGNOSIS — E119 Type 2 diabetes mellitus without complications: Secondary | ICD-10-CM | POA: Diagnosis not present

## 2022-03-15 DIAGNOSIS — E782 Mixed hyperlipidemia: Secondary | ICD-10-CM | POA: Diagnosis not present

## 2022-03-15 DIAGNOSIS — D518 Other vitamin B12 deficiency anemias: Secondary | ICD-10-CM | POA: Diagnosis not present

## 2022-03-15 DIAGNOSIS — E038 Other specified hypothyroidism: Secondary | ICD-10-CM | POA: Diagnosis not present

## 2022-03-18 DIAGNOSIS — E119 Type 2 diabetes mellitus without complications: Secondary | ICD-10-CM | POA: Diagnosis not present

## 2022-03-18 DIAGNOSIS — K219 Gastro-esophageal reflux disease without esophagitis: Secondary | ICD-10-CM | POA: Diagnosis not present

## 2022-03-18 DIAGNOSIS — I10 Essential (primary) hypertension: Secondary | ICD-10-CM | POA: Diagnosis not present

## 2022-03-18 DIAGNOSIS — G8929 Other chronic pain: Secondary | ICD-10-CM | POA: Diagnosis not present

## 2022-03-24 DIAGNOSIS — Z79899 Other long term (current) drug therapy: Secondary | ICD-10-CM | POA: Diagnosis not present

## 2022-04-02 DIAGNOSIS — F32A Depression, unspecified: Secondary | ICD-10-CM | POA: Diagnosis not present

## 2022-04-02 DIAGNOSIS — F419 Anxiety disorder, unspecified: Secondary | ICD-10-CM | POA: Diagnosis not present

## 2022-04-02 DIAGNOSIS — R69 Illness, unspecified: Secondary | ICD-10-CM | POA: Diagnosis not present

## 2022-04-02 DIAGNOSIS — F03918 Unspecified dementia, unspecified severity, with other behavioral disturbance: Secondary | ICD-10-CM | POA: Diagnosis not present

## 2022-04-14 DIAGNOSIS — J449 Chronic obstructive pulmonary disease, unspecified: Secondary | ICD-10-CM | POA: Diagnosis not present

## 2022-04-15 DIAGNOSIS — E119 Type 2 diabetes mellitus without complications: Secondary | ICD-10-CM | POA: Diagnosis not present

## 2022-04-15 DIAGNOSIS — K219 Gastro-esophageal reflux disease without esophagitis: Secondary | ICD-10-CM | POA: Diagnosis not present

## 2022-04-15 DIAGNOSIS — I10 Essential (primary) hypertension: Secondary | ICD-10-CM | POA: Diagnosis not present

## 2022-04-15 DIAGNOSIS — G8929 Other chronic pain: Secondary | ICD-10-CM | POA: Diagnosis not present

## 2022-04-16 ENCOUNTER — Telehealth: Payer: Self-pay | Admitting: Internal Medicine

## 2022-04-16 NOTE — Telephone Encounter (Signed)
Called patient to schedule Medicare Annual Wellness Visit (AWV). Left message for patient to call back and schedule Medicare Annual Wellness Visit (AWV).  AWV-I DUE AS OF 02/17/09 PER [place] :   Please schedule an appointment at any time with [facility] .  If any questions, please contact me at [phone].  Thank you ,  [place] Direct Dial: [phone]

## 2022-04-17 DIAGNOSIS — J410 Simple chronic bronchitis: Secondary | ICD-10-CM | POA: Diagnosis not present

## 2022-04-17 DIAGNOSIS — R0602 Shortness of breath: Secondary | ICD-10-CM | POA: Diagnosis not present

## 2022-04-17 DIAGNOSIS — R069 Unspecified abnormalities of breathing: Secondary | ICD-10-CM | POA: Diagnosis not present

## 2022-04-17 DIAGNOSIS — R051 Acute cough: Secondary | ICD-10-CM | POA: Diagnosis not present

## 2022-04-17 DIAGNOSIS — R531 Weakness: Secondary | ICD-10-CM | POA: Diagnosis not present

## 2022-04-17 DIAGNOSIS — R5382 Chronic fatigue, unspecified: Secondary | ICD-10-CM | POA: Diagnosis not present

## 2022-04-17 DIAGNOSIS — R062 Wheezing: Secondary | ICD-10-CM | POA: Diagnosis not present

## 2022-04-17 DIAGNOSIS — J040 Acute laryngitis: Secondary | ICD-10-CM | POA: Diagnosis not present

## 2022-04-17 DIAGNOSIS — J039 Acute tonsillitis, unspecified: Secondary | ICD-10-CM | POA: Diagnosis not present

## 2022-04-21 DIAGNOSIS — R69 Illness, unspecified: Secondary | ICD-10-CM | POA: Diagnosis not present

## 2022-04-21 DIAGNOSIS — D518 Other vitamin B12 deficiency anemias: Secondary | ICD-10-CM | POA: Diagnosis not present

## 2022-04-21 DIAGNOSIS — E782 Mixed hyperlipidemia: Secondary | ICD-10-CM | POA: Diagnosis not present

## 2022-04-21 DIAGNOSIS — Z79899 Other long term (current) drug therapy: Secondary | ICD-10-CM | POA: Diagnosis not present

## 2022-04-21 DIAGNOSIS — E119 Type 2 diabetes mellitus without complications: Secondary | ICD-10-CM | POA: Diagnosis not present

## 2022-05-05 DIAGNOSIS — F32A Depression, unspecified: Secondary | ICD-10-CM | POA: Diagnosis not present

## 2022-05-05 DIAGNOSIS — F419 Anxiety disorder, unspecified: Secondary | ICD-10-CM | POA: Diagnosis not present

## 2022-05-05 DIAGNOSIS — R69 Illness, unspecified: Secondary | ICD-10-CM | POA: Diagnosis not present

## 2022-05-05 DIAGNOSIS — F03918 Unspecified dementia, unspecified severity, with other behavioral disturbance: Secondary | ICD-10-CM | POA: Diagnosis not present

## 2022-05-13 DIAGNOSIS — R110 Nausea: Secondary | ICD-10-CM | POA: Diagnosis not present

## 2022-05-13 DIAGNOSIS — I10 Essential (primary) hypertension: Secondary | ICD-10-CM | POA: Diagnosis not present

## 2022-05-13 DIAGNOSIS — R69 Illness, unspecified: Secondary | ICD-10-CM | POA: Diagnosis not present

## 2022-05-13 DIAGNOSIS — E119 Type 2 diabetes mellitus without complications: Secondary | ICD-10-CM | POA: Diagnosis not present

## 2022-05-19 DIAGNOSIS — F419 Anxiety disorder, unspecified: Secondary | ICD-10-CM | POA: Diagnosis not present

## 2022-05-19 DIAGNOSIS — F32A Depression, unspecified: Secondary | ICD-10-CM | POA: Diagnosis not present

## 2022-05-19 DIAGNOSIS — F4310 Post-traumatic stress disorder, unspecified: Secondary | ICD-10-CM | POA: Diagnosis not present

## 2022-05-19 DIAGNOSIS — B179 Acute viral hepatitis, unspecified: Secondary | ICD-10-CM | POA: Diagnosis not present

## 2022-05-19 DIAGNOSIS — R69 Illness, unspecified: Secondary | ICD-10-CM | POA: Diagnosis not present

## 2022-05-19 DIAGNOSIS — F03918 Unspecified dementia, unspecified severity, with other behavioral disturbance: Secondary | ICD-10-CM | POA: Diagnosis not present

## 2022-05-19 DIAGNOSIS — Z79899 Other long term (current) drug therapy: Secondary | ICD-10-CM | POA: Diagnosis not present

## 2022-06-10 DIAGNOSIS — F419 Anxiety disorder, unspecified: Secondary | ICD-10-CM | POA: Diagnosis not present

## 2022-06-10 DIAGNOSIS — E119 Type 2 diabetes mellitus without complications: Secondary | ICD-10-CM | POA: Diagnosis not present

## 2022-06-10 DIAGNOSIS — B372 Candidiasis of skin and nail: Secondary | ICD-10-CM | POA: Diagnosis not present

## 2022-06-10 DIAGNOSIS — I10 Essential (primary) hypertension: Secondary | ICD-10-CM | POA: Diagnosis not present

## 2022-06-11 DIAGNOSIS — F4310 Post-traumatic stress disorder, unspecified: Secondary | ICD-10-CM | POA: Diagnosis not present

## 2022-06-11 DIAGNOSIS — F32A Depression, unspecified: Secondary | ICD-10-CM | POA: Diagnosis not present

## 2022-06-11 DIAGNOSIS — F419 Anxiety disorder, unspecified: Secondary | ICD-10-CM | POA: Diagnosis not present

## 2022-06-11 DIAGNOSIS — F03918 Unspecified dementia, unspecified severity, with other behavioral disturbance: Secondary | ICD-10-CM | POA: Diagnosis not present

## 2022-06-13 DIAGNOSIS — J449 Chronic obstructive pulmonary disease, unspecified: Secondary | ICD-10-CM | POA: Diagnosis not present

## 2022-06-27 DIAGNOSIS — F4310 Post-traumatic stress disorder, unspecified: Secondary | ICD-10-CM | POA: Diagnosis not present

## 2022-06-27 DIAGNOSIS — F419 Anxiety disorder, unspecified: Secondary | ICD-10-CM | POA: Diagnosis not present

## 2022-06-27 DIAGNOSIS — F03918 Unspecified dementia, unspecified severity, with other behavioral disturbance: Secondary | ICD-10-CM | POA: Diagnosis not present

## 2022-06-27 DIAGNOSIS — F32A Depression, unspecified: Secondary | ICD-10-CM | POA: Diagnosis not present

## 2022-07-01 DIAGNOSIS — F419 Anxiety disorder, unspecified: Secondary | ICD-10-CM | POA: Diagnosis not present

## 2022-07-01 DIAGNOSIS — F4310 Post-traumatic stress disorder, unspecified: Secondary | ICD-10-CM | POA: Diagnosis not present

## 2022-07-01 DIAGNOSIS — Z79899 Other long term (current) drug therapy: Secondary | ICD-10-CM | POA: Diagnosis not present

## 2022-07-08 DIAGNOSIS — F32A Depression, unspecified: Secondary | ICD-10-CM | POA: Diagnosis not present

## 2022-07-08 DIAGNOSIS — F4310 Post-traumatic stress disorder, unspecified: Secondary | ICD-10-CM | POA: Diagnosis not present

## 2022-07-08 DIAGNOSIS — F419 Anxiety disorder, unspecified: Secondary | ICD-10-CM | POA: Diagnosis not present

## 2022-07-09 DIAGNOSIS — M25511 Pain in right shoulder: Secondary | ICD-10-CM | POA: Diagnosis not present

## 2022-07-11 DIAGNOSIS — Z79899 Other long term (current) drug therapy: Secondary | ICD-10-CM | POA: Diagnosis not present

## 2022-07-11 DIAGNOSIS — D518 Other vitamin B12 deficiency anemias: Secondary | ICD-10-CM | POA: Diagnosis not present

## 2022-07-11 DIAGNOSIS — E119 Type 2 diabetes mellitus without complications: Secondary | ICD-10-CM | POA: Diagnosis not present

## 2022-07-11 DIAGNOSIS — E038 Other specified hypothyroidism: Secondary | ICD-10-CM | POA: Diagnosis not present

## 2022-07-11 DIAGNOSIS — E782 Mixed hyperlipidemia: Secondary | ICD-10-CM | POA: Diagnosis not present

## 2022-07-13 DIAGNOSIS — J449 Chronic obstructive pulmonary disease, unspecified: Secondary | ICD-10-CM | POA: Diagnosis not present

## 2022-07-22 DIAGNOSIS — S46001A Unspecified injury of muscle(s) and tendon(s) of the rotator cuff of right shoulder, initial encounter: Secondary | ICD-10-CM | POA: Diagnosis not present

## 2022-07-22 DIAGNOSIS — B372 Candidiasis of skin and nail: Secondary | ICD-10-CM | POA: Diagnosis not present

## 2022-07-22 DIAGNOSIS — M25511 Pain in right shoulder: Secondary | ICD-10-CM | POA: Diagnosis not present

## 2022-07-22 DIAGNOSIS — I10 Essential (primary) hypertension: Secondary | ICD-10-CM | POA: Diagnosis not present

## 2022-08-06 DIAGNOSIS — F419 Anxiety disorder, unspecified: Secondary | ICD-10-CM | POA: Diagnosis not present

## 2022-08-06 DIAGNOSIS — F4310 Post-traumatic stress disorder, unspecified: Secondary | ICD-10-CM | POA: Diagnosis not present

## 2022-08-06 DIAGNOSIS — F32A Depression, unspecified: Secondary | ICD-10-CM | POA: Diagnosis not present

## 2022-08-11 DIAGNOSIS — M75121 Complete rotator cuff tear or rupture of right shoulder, not specified as traumatic: Secondary | ICD-10-CM | POA: Diagnosis not present

## 2022-08-11 DIAGNOSIS — G8929 Other chronic pain: Secondary | ICD-10-CM | POA: Diagnosis not present

## 2022-08-11 DIAGNOSIS — M19011 Primary osteoarthritis, right shoulder: Secondary | ICD-10-CM | POA: Diagnosis not present

## 2022-08-11 DIAGNOSIS — M47816 Spondylosis without myelopathy or radiculopathy, lumbar region: Secondary | ICD-10-CM | POA: Diagnosis not present

## 2022-08-11 DIAGNOSIS — M5416 Radiculopathy, lumbar region: Secondary | ICD-10-CM | POA: Diagnosis not present

## 2022-08-12 DIAGNOSIS — R002 Palpitations: Secondary | ICD-10-CM | POA: Diagnosis not present

## 2022-08-12 DIAGNOSIS — S46091D Other injury of muscle(s) and tendon(s) of the rotator cuff of right shoulder, subsequent encounter: Secondary | ICD-10-CM | POA: Diagnosis not present

## 2022-08-12 DIAGNOSIS — J309 Allergic rhinitis, unspecified: Secondary | ICD-10-CM | POA: Diagnosis not present

## 2022-08-12 DIAGNOSIS — I10 Essential (primary) hypertension: Secondary | ICD-10-CM | POA: Diagnosis not present

## 2022-08-13 DIAGNOSIS — R079 Chest pain, unspecified: Secondary | ICD-10-CM | POA: Diagnosis not present

## 2022-08-13 DIAGNOSIS — J449 Chronic obstructive pulmonary disease, unspecified: Secondary | ICD-10-CM | POA: Diagnosis not present

## 2022-08-14 DIAGNOSIS — G43909 Migraine, unspecified, not intractable, without status migrainosus: Secondary | ICD-10-CM | POA: Diagnosis not present

## 2022-08-14 DIAGNOSIS — F32A Depression, unspecified: Secondary | ICD-10-CM | POA: Diagnosis not present

## 2022-08-14 DIAGNOSIS — Z79891 Long term (current) use of opiate analgesic: Secondary | ICD-10-CM | POA: Diagnosis not present

## 2022-08-14 DIAGNOSIS — M47816 Spondylosis without myelopathy or radiculopathy, lumbar region: Secondary | ICD-10-CM | POA: Diagnosis not present

## 2022-08-14 DIAGNOSIS — M797 Fibromyalgia: Secondary | ICD-10-CM | POA: Diagnosis not present

## 2022-08-14 DIAGNOSIS — R5382 Chronic fatigue, unspecified: Secondary | ICD-10-CM | POA: Diagnosis not present

## 2022-08-14 DIAGNOSIS — M75121 Complete rotator cuff tear or rupture of right shoulder, not specified as traumatic: Secondary | ICD-10-CM | POA: Diagnosis not present

## 2022-08-14 DIAGNOSIS — M5416 Radiculopathy, lumbar region: Secondary | ICD-10-CM | POA: Diagnosis not present

## 2022-08-14 DIAGNOSIS — M19011 Primary osteoarthritis, right shoulder: Secondary | ICD-10-CM | POA: Diagnosis not present

## 2022-08-14 DIAGNOSIS — Z791 Long term (current) use of non-steroidal anti-inflammatories (NSAID): Secondary | ICD-10-CM | POA: Diagnosis not present

## 2022-08-14 DIAGNOSIS — I10 Essential (primary) hypertension: Secondary | ICD-10-CM | POA: Diagnosis not present

## 2022-08-14 DIAGNOSIS — G8929 Other chronic pain: Secondary | ICD-10-CM | POA: Diagnosis not present

## 2022-08-14 DIAGNOSIS — F410 Panic disorder [episodic paroxysmal anxiety] without agoraphobia: Secondary | ICD-10-CM | POA: Diagnosis not present

## 2022-08-20 DIAGNOSIS — E782 Mixed hyperlipidemia: Secondary | ICD-10-CM | POA: Diagnosis not present

## 2022-08-20 DIAGNOSIS — D518 Other vitamin B12 deficiency anemias: Secondary | ICD-10-CM | POA: Diagnosis not present

## 2022-08-20 DIAGNOSIS — E119 Type 2 diabetes mellitus without complications: Secondary | ICD-10-CM | POA: Diagnosis not present

## 2022-08-20 DIAGNOSIS — E038 Other specified hypothyroidism: Secondary | ICD-10-CM | POA: Diagnosis not present

## 2022-08-20 DIAGNOSIS — Z79899 Other long term (current) drug therapy: Secondary | ICD-10-CM | POA: Diagnosis not present

## 2022-08-27 DIAGNOSIS — L600 Ingrowing nail: Secondary | ICD-10-CM | POA: Diagnosis not present

## 2022-08-27 DIAGNOSIS — M2011 Hallux valgus (acquired), right foot: Secondary | ICD-10-CM | POA: Diagnosis not present

## 2022-08-27 DIAGNOSIS — I8393 Asymptomatic varicose veins of bilateral lower extremities: Secondary | ICD-10-CM | POA: Diagnosis not present

## 2022-08-27 DIAGNOSIS — M2012 Hallux valgus (acquired), left foot: Secondary | ICD-10-CM | POA: Diagnosis not present

## 2022-08-27 DIAGNOSIS — M79671 Pain in right foot: Secondary | ICD-10-CM | POA: Diagnosis not present

## 2022-08-27 DIAGNOSIS — B351 Tinea unguium: Secondary | ICD-10-CM | POA: Diagnosis not present

## 2022-08-27 DIAGNOSIS — E1140 Type 2 diabetes mellitus with diabetic neuropathy, unspecified: Secondary | ICD-10-CM | POA: Diagnosis not present

## 2022-08-27 DIAGNOSIS — M79672 Pain in left foot: Secondary | ICD-10-CM | POA: Diagnosis not present

## 2022-09-02 DIAGNOSIS — M5441 Lumbago with sciatica, right side: Secondary | ICD-10-CM | POA: Diagnosis not present

## 2022-09-02 DIAGNOSIS — G8929 Other chronic pain: Secondary | ICD-10-CM | POA: Diagnosis not present

## 2022-09-02 DIAGNOSIS — M5442 Lumbago with sciatica, left side: Secondary | ICD-10-CM | POA: Diagnosis not present

## 2022-09-04 ENCOUNTER — Other Ambulatory Visit: Payer: Self-pay | Admitting: Physical Medicine & Rehabilitation

## 2022-09-04 ENCOUNTER — Encounter: Payer: Self-pay | Admitting: Physical Medicine & Rehabilitation

## 2022-09-04 DIAGNOSIS — G8929 Other chronic pain: Secondary | ICD-10-CM

## 2022-09-08 DIAGNOSIS — E119 Type 2 diabetes mellitus without complications: Secondary | ICD-10-CM | POA: Diagnosis not present

## 2022-09-08 DIAGNOSIS — H2513 Age-related nuclear cataract, bilateral: Secondary | ICD-10-CM | POA: Diagnosis not present

## 2022-09-09 DIAGNOSIS — J309 Allergic rhinitis, unspecified: Secondary | ICD-10-CM | POA: Diagnosis not present

## 2022-09-09 DIAGNOSIS — F32A Depression, unspecified: Secondary | ICD-10-CM | POA: Diagnosis not present

## 2022-09-09 DIAGNOSIS — F03918 Unspecified dementia, unspecified severity, with other behavioral disturbance: Secondary | ICD-10-CM | POA: Diagnosis not present

## 2022-09-09 DIAGNOSIS — I951 Orthostatic hypotension: Secondary | ICD-10-CM | POA: Diagnosis not present

## 2022-09-09 DIAGNOSIS — F419 Anxiety disorder, unspecified: Secondary | ICD-10-CM | POA: Diagnosis not present

## 2022-09-09 DIAGNOSIS — F4310 Post-traumatic stress disorder, unspecified: Secondary | ICD-10-CM | POA: Diagnosis not present

## 2022-09-09 DIAGNOSIS — E119 Type 2 diabetes mellitus without complications: Secondary | ICD-10-CM | POA: Diagnosis not present

## 2022-09-09 DIAGNOSIS — R400 Somnolence: Secondary | ICD-10-CM | POA: Diagnosis not present

## 2022-09-12 DIAGNOSIS — J449 Chronic obstructive pulmonary disease, unspecified: Secondary | ICD-10-CM | POA: Diagnosis not present

## 2022-09-15 DIAGNOSIS — E782 Mixed hyperlipidemia: Secondary | ICD-10-CM | POA: Diagnosis not present

## 2022-09-15 DIAGNOSIS — E119 Type 2 diabetes mellitus without complications: Secondary | ICD-10-CM | POA: Diagnosis not present

## 2022-09-15 DIAGNOSIS — E612 Magnesium deficiency: Secondary | ICD-10-CM | POA: Diagnosis not present

## 2022-09-15 DIAGNOSIS — Z79899 Other long term (current) drug therapy: Secondary | ICD-10-CM | POA: Diagnosis not present

## 2022-09-15 DIAGNOSIS — D518 Other vitamin B12 deficiency anemias: Secondary | ICD-10-CM | POA: Diagnosis not present

## 2022-09-19 DIAGNOSIS — N3080 Other cystitis without hematuria: Secondary | ICD-10-CM | POA: Diagnosis not present

## 2022-09-22 DIAGNOSIS — M19011 Primary osteoarthritis, right shoulder: Secondary | ICD-10-CM | POA: Diagnosis not present

## 2022-09-22 DIAGNOSIS — G8929 Other chronic pain: Secondary | ICD-10-CM | POA: Diagnosis not present

## 2022-09-22 DIAGNOSIS — M25511 Pain in right shoulder: Secondary | ICD-10-CM | POA: Diagnosis not present

## 2022-09-22 DIAGNOSIS — M7581 Other shoulder lesions, right shoulder: Secondary | ICD-10-CM | POA: Diagnosis not present

## 2022-09-22 DIAGNOSIS — M75121 Complete rotator cuff tear or rupture of right shoulder, not specified as traumatic: Secondary | ICD-10-CM | POA: Diagnosis not present

## 2022-09-24 ENCOUNTER — Telehealth: Payer: Self-pay | Admitting: Internal Medicine

## 2022-09-24 NOTE — Telephone Encounter (Signed)
Contacted Leighanna M [place] to schedule their annual wellness visit. Patient declined to schedule AWV at this time.Patient is seeing doctors at the nursing home.   [facility] Care Guide [facility] AWV TEAM Direct Dial: [phone]

## 2022-09-25 DIAGNOSIS — R296 Repeated falls: Secondary | ICD-10-CM | POA: Diagnosis not present

## 2022-09-25 DIAGNOSIS — N390 Urinary tract infection, site not specified: Secondary | ICD-10-CM | POA: Diagnosis not present

## 2022-09-25 DIAGNOSIS — R5381 Other malaise: Secondary | ICD-10-CM | POA: Diagnosis not present

## 2022-09-29 DIAGNOSIS — D518 Other vitamin B12 deficiency anemias: Secondary | ICD-10-CM | POA: Diagnosis not present

## 2022-09-29 DIAGNOSIS — E782 Mixed hyperlipidemia: Secondary | ICD-10-CM | POA: Diagnosis not present

## 2022-09-29 DIAGNOSIS — Z79899 Other long term (current) drug therapy: Secondary | ICD-10-CM | POA: Diagnosis not present

## 2022-09-29 DIAGNOSIS — E119 Type 2 diabetes mellitus without complications: Secondary | ICD-10-CM | POA: Diagnosis not present

## 2022-10-02 DIAGNOSIS — F02A18 Dementia in other diseases classified elsewhere, mild, with other behavioral disturbance: Secondary | ICD-10-CM | POA: Diagnosis not present

## 2022-10-02 DIAGNOSIS — F419 Anxiety disorder, unspecified: Secondary | ICD-10-CM | POA: Diagnosis not present

## 2022-10-02 DIAGNOSIS — F32A Depression, unspecified: Secondary | ICD-10-CM | POA: Diagnosis not present

## 2022-10-02 DIAGNOSIS — F4310 Post-traumatic stress disorder, unspecified: Secondary | ICD-10-CM | POA: Diagnosis not present

## 2022-10-07 ENCOUNTER — Encounter: Payer: Self-pay | Admitting: Physical Medicine & Rehabilitation

## 2022-10-08 DIAGNOSIS — F419 Anxiety disorder, unspecified: Secondary | ICD-10-CM | POA: Diagnosis not present

## 2022-10-08 DIAGNOSIS — F4310 Post-traumatic stress disorder, unspecified: Secondary | ICD-10-CM | POA: Diagnosis not present

## 2022-10-08 DIAGNOSIS — F03A4 Unspecified dementia, mild, with anxiety: Secondary | ICD-10-CM | POA: Diagnosis not present

## 2022-10-08 DIAGNOSIS — F32A Depression, unspecified: Secondary | ICD-10-CM | POA: Diagnosis not present

## 2022-10-10 ENCOUNTER — Other Ambulatory Visit: Payer: Medicare HMO

## 2022-10-10 DIAGNOSIS — F419 Anxiety disorder, unspecified: Secondary | ICD-10-CM | POA: Diagnosis not present

## 2022-10-10 DIAGNOSIS — F32A Depression, unspecified: Secondary | ICD-10-CM | POA: Diagnosis not present

## 2022-10-10 DIAGNOSIS — F03918 Unspecified dementia, unspecified severity, with other behavioral disturbance: Secondary | ICD-10-CM | POA: Diagnosis not present

## 2022-10-10 DIAGNOSIS — F03A4 Unspecified dementia, mild, with anxiety: Secondary | ICD-10-CM | POA: Diagnosis not present

## 2022-10-13 DIAGNOSIS — J449 Chronic obstructive pulmonary disease, unspecified: Secondary | ICD-10-CM | POA: Diagnosis not present

## 2022-10-21 DIAGNOSIS — J309 Allergic rhinitis, unspecified: Secondary | ICD-10-CM | POA: Diagnosis not present

## 2022-10-21 DIAGNOSIS — R7989 Other specified abnormal findings of blood chemistry: Secondary | ICD-10-CM | POA: Diagnosis not present

## 2022-10-21 DIAGNOSIS — E119 Type 2 diabetes mellitus without complications: Secondary | ICD-10-CM | POA: Diagnosis not present

## 2022-10-21 DIAGNOSIS — M19011 Primary osteoarthritis, right shoulder: Secondary | ICD-10-CM | POA: Diagnosis not present

## 2022-10-27 DIAGNOSIS — F32A Depression, unspecified: Secondary | ICD-10-CM | POA: Diagnosis not present

## 2022-10-27 DIAGNOSIS — F03918 Unspecified dementia, unspecified severity, with other behavioral disturbance: Secondary | ICD-10-CM | POA: Diagnosis not present

## 2022-10-27 DIAGNOSIS — F4310 Post-traumatic stress disorder, unspecified: Secondary | ICD-10-CM | POA: Diagnosis not present

## 2022-10-27 DIAGNOSIS — F03A4 Unspecified dementia, mild, with anxiety: Secondary | ICD-10-CM | POA: Diagnosis not present

## 2022-10-27 DIAGNOSIS — F419 Anxiety disorder, unspecified: Secondary | ICD-10-CM | POA: Diagnosis not present

## 2022-11-13 DIAGNOSIS — F419 Anxiety disorder, unspecified: Secondary | ICD-10-CM | POA: Diagnosis not present

## 2022-11-13 DIAGNOSIS — F03A4 Unspecified dementia, mild, with anxiety: Secondary | ICD-10-CM | POA: Diagnosis not present

## 2022-11-13 DIAGNOSIS — J449 Chronic obstructive pulmonary disease, unspecified: Secondary | ICD-10-CM | POA: Diagnosis not present

## 2022-11-13 DIAGNOSIS — F4310 Post-traumatic stress disorder, unspecified: Secondary | ICD-10-CM | POA: Diagnosis not present

## 2022-11-13 DIAGNOSIS — F02A18 Dementia in other diseases classified elsewhere, mild, with other behavioral disturbance: Secondary | ICD-10-CM | POA: Diagnosis not present

## 2022-11-13 DIAGNOSIS — F32A Depression, unspecified: Secondary | ICD-10-CM | POA: Diagnosis not present

## 2022-11-13 DIAGNOSIS — F03918 Unspecified dementia, unspecified severity, with other behavioral disturbance: Secondary | ICD-10-CM | POA: Diagnosis not present

## 2022-11-17 DIAGNOSIS — E038 Other specified hypothyroidism: Secondary | ICD-10-CM | POA: Diagnosis not present

## 2022-11-17 DIAGNOSIS — I10 Essential (primary) hypertension: Secondary | ICD-10-CM | POA: Diagnosis not present

## 2022-11-17 DIAGNOSIS — E559 Vitamin D deficiency, unspecified: Secondary | ICD-10-CM | POA: Diagnosis not present

## 2022-11-17 DIAGNOSIS — E119 Type 2 diabetes mellitus without complications: Secondary | ICD-10-CM | POA: Diagnosis not present

## 2022-11-17 DIAGNOSIS — E782 Mixed hyperlipidemia: Secondary | ICD-10-CM | POA: Diagnosis not present

## 2022-11-19 DIAGNOSIS — B351 Tinea unguium: Secondary | ICD-10-CM | POA: Diagnosis not present

## 2022-11-19 DIAGNOSIS — L600 Ingrowing nail: Secondary | ICD-10-CM | POA: Diagnosis not present

## 2022-11-19 DIAGNOSIS — M79671 Pain in right foot: Secondary | ICD-10-CM | POA: Diagnosis not present

## 2022-11-19 DIAGNOSIS — E1140 Type 2 diabetes mellitus with diabetic neuropathy, unspecified: Secondary | ICD-10-CM | POA: Diagnosis not present

## 2022-11-21 DIAGNOSIS — R011 Cardiac murmur, unspecified: Secondary | ICD-10-CM | POA: Diagnosis not present

## 2022-11-24 DIAGNOSIS — F03A4 Unspecified dementia, mild, with anxiety: Secondary | ICD-10-CM | POA: Diagnosis not present

## 2022-11-24 DIAGNOSIS — F03918 Unspecified dementia, unspecified severity, with other behavioral disturbance: Secondary | ICD-10-CM | POA: Diagnosis not present

## 2022-11-24 DIAGNOSIS — F32A Depression, unspecified: Secondary | ICD-10-CM | POA: Diagnosis not present

## 2022-11-24 DIAGNOSIS — F4310 Post-traumatic stress disorder, unspecified: Secondary | ICD-10-CM | POA: Diagnosis not present

## 2022-11-24 DIAGNOSIS — F419 Anxiety disorder, unspecified: Secondary | ICD-10-CM | POA: Diagnosis not present

## 2022-12-01 DIAGNOSIS — Z79899 Other long term (current) drug therapy: Secondary | ICD-10-CM | POA: Diagnosis not present

## 2022-12-01 DIAGNOSIS — E038 Other specified hypothyroidism: Secondary | ICD-10-CM | POA: Diagnosis not present

## 2022-12-01 DIAGNOSIS — E559 Vitamin D deficiency, unspecified: Secondary | ICD-10-CM | POA: Diagnosis not present

## 2022-12-01 DIAGNOSIS — E119 Type 2 diabetes mellitus without complications: Secondary | ICD-10-CM | POA: Diagnosis not present

## 2022-12-01 DIAGNOSIS — E782 Mixed hyperlipidemia: Secondary | ICD-10-CM | POA: Diagnosis not present

## 2022-12-01 DIAGNOSIS — D519 Vitamin B12 deficiency anemia, unspecified: Secondary | ICD-10-CM | POA: Diagnosis not present

## 2022-12-08 DIAGNOSIS — F4310 Post-traumatic stress disorder, unspecified: Secondary | ICD-10-CM | POA: Diagnosis not present

## 2022-12-08 DIAGNOSIS — F03918 Unspecified dementia, unspecified severity, with other behavioral disturbance: Secondary | ICD-10-CM | POA: Diagnosis not present

## 2022-12-08 DIAGNOSIS — F32A Depression, unspecified: Secondary | ICD-10-CM | POA: Diagnosis not present

## 2022-12-08 DIAGNOSIS — F419 Anxiety disorder, unspecified: Secondary | ICD-10-CM | POA: Diagnosis not present

## 2022-12-08 DIAGNOSIS — F03A4 Unspecified dementia, mild, with anxiety: Secondary | ICD-10-CM | POA: Diagnosis not present

## 2022-12-08 DIAGNOSIS — I6523 Occlusion and stenosis of bilateral carotid arteries: Secondary | ICD-10-CM | POA: Diagnosis not present

## 2022-12-08 DIAGNOSIS — R0989 Other specified symptoms and signs involving the circulatory and respiratory systems: Secondary | ICD-10-CM | POA: Diagnosis not present

## 2022-12-09 DIAGNOSIS — I77811 Abdominal aortic ectasia: Secondary | ICD-10-CM | POA: Diagnosis not present

## 2022-12-10 DIAGNOSIS — D519 Vitamin B12 deficiency anemia, unspecified: Secondary | ICD-10-CM | POA: Diagnosis not present

## 2022-12-10 DIAGNOSIS — I10 Essential (primary) hypertension: Secondary | ICD-10-CM | POA: Diagnosis not present

## 2022-12-10 DIAGNOSIS — E119 Type 2 diabetes mellitus without complications: Secondary | ICD-10-CM | POA: Diagnosis not present

## 2022-12-10 DIAGNOSIS — E559 Vitamin D deficiency, unspecified: Secondary | ICD-10-CM | POA: Diagnosis not present

## 2022-12-10 DIAGNOSIS — E782 Mixed hyperlipidemia: Secondary | ICD-10-CM | POA: Diagnosis not present

## 2022-12-10 DIAGNOSIS — E038 Other specified hypothyroidism: Secondary | ICD-10-CM | POA: Diagnosis not present

## 2022-12-11 DIAGNOSIS — I70223 Atherosclerosis of native arteries of extremities with rest pain, bilateral legs: Secondary | ICD-10-CM | POA: Diagnosis not present

## 2022-12-12 DIAGNOSIS — R221 Localized swelling, mass and lump, neck: Secondary | ICD-10-CM | POA: Diagnosis not present

## 2022-12-12 DIAGNOSIS — F32A Depression, unspecified: Secondary | ICD-10-CM | POA: Diagnosis not present

## 2022-12-12 DIAGNOSIS — F03A4 Unspecified dementia, mild, with anxiety: Secondary | ICD-10-CM | POA: Diagnosis not present

## 2022-12-12 DIAGNOSIS — F02A18 Dementia in other diseases classified elsewhere, mild, with other behavioral disturbance: Secondary | ICD-10-CM | POA: Diagnosis not present

## 2022-12-12 DIAGNOSIS — F03918 Unspecified dementia, unspecified severity, with other behavioral disturbance: Secondary | ICD-10-CM | POA: Diagnosis not present

## 2022-12-12 DIAGNOSIS — F4310 Post-traumatic stress disorder, unspecified: Secondary | ICD-10-CM | POA: Diagnosis not present

## 2022-12-12 DIAGNOSIS — E042 Nontoxic multinodular goiter: Secondary | ICD-10-CM | POA: Diagnosis not present

## 2022-12-12 DIAGNOSIS — F419 Anxiety disorder, unspecified: Secondary | ICD-10-CM | POA: Diagnosis not present

## 2022-12-13 DIAGNOSIS — J449 Chronic obstructive pulmonary disease, unspecified: Secondary | ICD-10-CM | POA: Diagnosis not present

## 2022-12-16 DIAGNOSIS — F410 Panic disorder [episodic paroxysmal anxiety] without agoraphobia: Secondary | ICD-10-CM | POA: Diagnosis not present

## 2022-12-16 DIAGNOSIS — M545 Low back pain, unspecified: Secondary | ICD-10-CM | POA: Diagnosis not present

## 2022-12-16 DIAGNOSIS — J309 Allergic rhinitis, unspecified: Secondary | ICD-10-CM | POA: Diagnosis not present

## 2022-12-16 DIAGNOSIS — R590 Localized enlarged lymph nodes: Secondary | ICD-10-CM | POA: Diagnosis not present

## 2022-12-16 DIAGNOSIS — F329 Major depressive disorder, single episode, unspecified: Secondary | ICD-10-CM | POA: Diagnosis not present

## 2022-12-16 DIAGNOSIS — M19011 Primary osteoarthritis, right shoulder: Secondary | ICD-10-CM | POA: Diagnosis not present

## 2022-12-16 DIAGNOSIS — G8929 Other chronic pain: Secondary | ICD-10-CM | POA: Diagnosis not present

## 2022-12-16 DIAGNOSIS — K219 Gastro-esophageal reflux disease without esophagitis: Secondary | ICD-10-CM | POA: Diagnosis not present

## 2022-12-16 DIAGNOSIS — Z0000 Encounter for general adult medical examination without abnormal findings: Secondary | ICD-10-CM | POA: Diagnosis not present

## 2022-12-16 DIAGNOSIS — B372 Candidiasis of skin and nail: Secondary | ICD-10-CM | POA: Diagnosis not present

## 2022-12-16 DIAGNOSIS — E119 Type 2 diabetes mellitus without complications: Secondary | ICD-10-CM | POA: Diagnosis not present

## 2022-12-16 DIAGNOSIS — L659 Nonscarring hair loss, unspecified: Secondary | ICD-10-CM | POA: Diagnosis not present

## 2022-12-16 DIAGNOSIS — L409 Psoriasis, unspecified: Secondary | ICD-10-CM | POA: Diagnosis not present

## 2022-12-17 ENCOUNTER — Emergency Department: Payer: Medicare HMO

## 2022-12-17 ENCOUNTER — Other Ambulatory Visit: Payer: Self-pay

## 2022-12-17 DIAGNOSIS — R079 Chest pain, unspecified: Secondary | ICD-10-CM | POA: Diagnosis not present

## 2022-12-17 DIAGNOSIS — I499 Cardiac arrhythmia, unspecified: Secondary | ICD-10-CM | POA: Diagnosis not present

## 2022-12-17 DIAGNOSIS — I110 Hypertensive heart disease with heart failure: Secondary | ICD-10-CM | POA: Diagnosis not present

## 2022-12-17 DIAGNOSIS — Z743 Need for continuous supervision: Secondary | ICD-10-CM | POA: Diagnosis not present

## 2022-12-17 DIAGNOSIS — I213 ST elevation (STEMI) myocardial infarction of unspecified site: Secondary | ICD-10-CM | POA: Diagnosis not present

## 2022-12-17 DIAGNOSIS — R42 Dizziness and giddiness: Secondary | ICD-10-CM | POA: Diagnosis not present

## 2022-12-17 DIAGNOSIS — R0902 Hypoxemia: Secondary | ICD-10-CM | POA: Diagnosis not present

## 2022-12-17 DIAGNOSIS — R002 Palpitations: Secondary | ICD-10-CM | POA: Insufficient documentation

## 2022-12-17 DIAGNOSIS — R0602 Shortness of breath: Secondary | ICD-10-CM | POA: Diagnosis not present

## 2022-12-17 DIAGNOSIS — I509 Heart failure, unspecified: Secondary | ICD-10-CM | POA: Insufficient documentation

## 2022-12-17 LAB — CBC WITH DIFFERENTIAL/PLATELET
Abs Immature Granulocytes: 0.09 10*3/uL — ABNORMAL HIGH (ref 0.00–0.07)
Basophils Absolute: 0.1 10*3/uL (ref 0.0–0.1)
Basophils Relative: 0 %
Eosinophils Absolute: 0.2 10*3/uL (ref 0.0–0.5)
Eosinophils Relative: 1 %
HCT: 41.2 % (ref 36.0–46.0)
Hemoglobin: 14 g/dL (ref 12.0–15.0)
Immature Granulocytes: 1 %
Lymphocytes Relative: 14 %
Lymphs Abs: 1.9 10*3/uL (ref 0.7–4.0)
MCH: 31.7 pg (ref 26.0–34.0)
MCHC: 34 g/dL (ref 30.0–36.0)
MCV: 93.2 fL (ref 80.0–100.0)
Monocytes Absolute: 0.9 10*3/uL (ref 0.1–1.0)
Monocytes Relative: 7 %
Neutro Abs: 10.6 10*3/uL — ABNORMAL HIGH (ref 1.7–7.7)
Neutrophils Relative %: 77 %
Platelets: 286 10*3/uL (ref 150–400)
RBC: 4.42 MIL/uL (ref 3.87–5.11)
RDW: 12.3 % (ref 11.5–15.5)
WBC: 13.7 10*3/uL — ABNORMAL HIGH (ref 4.0–10.5)
nRBC: 0 % (ref 0.0–0.2)

## 2022-12-17 LAB — COMPREHENSIVE METABOLIC PANEL
ALT: 21 U/L (ref 0–44)
AST: 21 U/L (ref 15–41)
Albumin: 4.1 g/dL (ref 3.5–5.0)
Alkaline Phosphatase: 84 U/L (ref 38–126)
Anion gap: 7 (ref 5–15)
BUN: 18 mg/dL (ref 8–23)
CO2: 23 mmol/L (ref 22–32)
Calcium: 8.7 mg/dL — ABNORMAL LOW (ref 8.9–10.3)
Chloride: 105 mmol/L (ref 98–111)
Creatinine, Ser: 0.85 mg/dL (ref 0.44–1.00)
GFR, Estimated: >60 mL/min (ref >60)
Glucose, Bld: 168 mg/dL — ABNORMAL HIGH (ref 70–99)
Potassium: 4.2 mmol/L (ref 3.5–5.1)
Sodium: 135 mmol/L (ref 135–145)
Total Bilirubin: 0.7 mg/dL (ref 0.3–1.2)
Total Protein: 7.1 g/dL (ref 6.5–8.1)

## 2022-12-17 LAB — TROPONIN I (HIGH SENSITIVITY): Troponin I (High Sensitivity): 10 ng/L (ref <18)

## 2022-12-17 NOTE — ED Triage Notes (Signed)
Pt reports intermittent palpitations over the past week, pt denies pain or shortness of breath. Pt reports some dizziness with changing positions and tinnitus.

## 2022-12-17 NOTE — ED Triage Notes (Signed)
EMS brings pt in from [facility], [place] Ditto for c/o palpitations

## 2022-12-18 ENCOUNTER — Emergency Department
Admission: EM | Admit: 2022-12-18 | Discharge: 2022-12-18 | Disposition: A | Discharge status: Home / Self Care | Payer: Medicare HMO | Attending: Emergency Medicine | Admitting: Emergency Medicine

## 2022-12-18 DIAGNOSIS — R002 Palpitations: Secondary | ICD-10-CM

## 2022-12-18 DIAGNOSIS — R2241 Localized swelling, mass and lump, right lower limb: Secondary | ICD-10-CM | POA: Diagnosis not present

## 2022-12-18 LAB — TSH: TSH: 1.251 u[IU]/mL (ref 0.350–4.500)

## 2022-12-18 LAB — T4, FREE: Free T4: 0.94 ng/dL (ref 0.61–1.12)

## 2022-12-18 LAB — TROPONIN I (HIGH SENSITIVITY): Troponin I (High Sensitivity): 8 ng/L (ref <18)

## 2022-12-18 LAB — MAGNESIUM: Magnesium: 1.9 mg/dL (ref 1.7–2.4)

## 2022-12-18 NOTE — Discharge Instructions (Signed)
 Thank you for choosing [place] for your health care today!  Please see your primary doctor this week for a follow up appointment.   If you have any new, worsening, or unexpected symptoms call your doctor right away or come back to the emergency department for reevaluation.  It was my pleasure to care for you today.   Daneil Dan [place] Charon, MD

## 2022-12-18 NOTE — ED Notes (Signed)
Pt able to stand and walk from wheel chair to bed. Pt placed on cardiac monitor.

## 2022-12-18 NOTE — ED Provider Notes (Signed)
[facility] Provider Note    Event Date/Time   First MD Initiated Contact with Patient 12/18/22 0028     (approximate)   History   Palpitations   HPI  Dawn [name] is a 82 y.o. female   Past medical history of CHF, fibromyalgia, hypertension, lupus, PTSD, rheumatoid arthritis, who presents to the emergency department with palpitations more frequently over the last several days in the setting of various life stressors.  She denies chest pain, shortness of breath, lightheadedness or any other acute medical complaints.  She feels that when she gets less sleep or is dehydrated she feels more of these fluttering sensations in her chest.   External Medical Documents Reviewed: [facility] clinical summary for past medical history and medications      Physical Exam   Triage Vital Signs: ED Triage Vitals  Encounter Vitals Group     BP 12/17/22 2056 (!) 168/82     Systolic BP Percentile --      Diastolic BP Percentile --      Pulse Rate 12/17/22 2056 95     Resp 12/17/22 2056 18     Temp 12/17/22 2056 98.4 F (36.9 C)     Temp Source 12/17/22 2056 Oral     SpO2 12/17/22 2051 96 %     Weight 12/17/22 2056 195 lb (88.5 kg)     Height 12/17/22 2056 5\' 4"  (1.626 m)     Head Circumference --      Peak Flow --      Pain Score 12/17/22 2056 0     Pain Loc --      Pain Education --      Exclude from Growth Chart --     Most recent vital signs: Vitals:   12/17/22 2348 12/18/22 0304  BP: (!) 158/96 (!) 162/80  Pulse: 88 80  Resp: 16 16  Temp: 98.5 F (36.9 C)   SpO2: 96% 99%    General: Awake, no distress.  CV:  Good peripheral perfusion.  Resp:  Normal effort.  Abd:  No distention.  Other:  Awake alert comfortable appearing hypertensive otherwise vital signs normal.  Regular rate and rhythm on my auscultation of the heart sounds, no murmurs, lungs clear, appears euvolemic.   ED Results / Procedures / Treatments    Labs (all labs ordered are listed, but only abnormal results are displayed) Labs Reviewed  CBC WITH DIFFERENTIAL/PLATELET - Abnormal; Notable for the following components:      Result Value   WBC 13.7 (*)    Neutro Abs 10.6 (*)    Abs Immature Granulocytes 0.09 (*)    All other components within normal limits  COMPREHENSIVE METABOLIC PANEL - Abnormal; Notable for the following components:   Glucose, Bld 168 (*)    Calcium 8.7 (*)    All other components within normal limits  TSH  T4, FREE  MAGNESIUM  TROPONIN I (HIGH SENSITIVITY)  TROPONIN I (HIGH SENSITIVITY)     I ordered and reviewed the above labs they are notable for her white blood cell count is elevated 13.7 consistent with prior testing, otherwise electrolytes and troponins within normal limits as are her thyroid function test  EKG  ED ECG REPORT I, Pilar Jarvis, the attending physician, personally viewed and interpreted this ECG.   Date: 12/18/2022  EKG Time: 2052  Rate: 105  Rhythm: sinus w PVCs, occasional bigeminy  Axis: nl  Intervals:nl  ST&T Change: No STEMI  RADIOLOGY I independently reviewed and interpreted chest x-ray and I see no obvious focality pneumothorax I also reviewed radiologist's formal read.   PROCEDURES:  Critical Care performed: No  Procedures   MEDICATIONS ORDERED IN ED: Medications - No data to display   IMPRESSION / MDM / ASSESSMENT AND PLAN / ED COURSE  I reviewed the triage vital signs and the nursing notes.                                Patient's presentation is most consistent with acute presentation with potential threat to life or bodily function.  Differential diagnosis includes, but is not limited to, dysrhythmia, thyroid dysfunction, electrolyte disturbances, ACS, PE   The patient is on the cardiac monitor to evaluate for evidence of arrhythmia and/or significant heart rate changes.  MDM:    Occasional palpitations worse in the setting of life  stressors poor sleep or dehydration.  Some occasional premature contractions on EKG with no ischemic changes and no malignant dysrhythmias noted.  Lab analysis of electrolytes and thyroid function troponins all within normal limits.  Patient looks markedly well.  I counseled her on caffeine, sleep, stress which may contribute to her palpitations.  She will be discharged with cardiology follow-up/Holter monitoring as needed.       FINAL CLINICAL IMPRESSION(S) / ED DIAGNOSES   Final diagnoses:  Palpitations     Rx / DC Orders   ED Discharge Orders          Ordered    Ambulatory referral to Cardiology       Comments: If you have not heard from the Cardiology office within the next 72 hours please call [phone].   12/18/22 0240             Note:  This document was prepared using Dragon voice recognition software and may include unintentional dictation errors.    Pilar Jarvis, MD 12/18/22 [phone]

## 2022-12-25 DIAGNOSIS — F03A4 Unspecified dementia, mild, with anxiety: Secondary | ICD-10-CM | POA: Diagnosis not present

## 2022-12-25 DIAGNOSIS — F32A Depression, unspecified: Secondary | ICD-10-CM | POA: Diagnosis not present

## 2022-12-25 DIAGNOSIS — F03918 Unspecified dementia, unspecified severity, with other behavioral disturbance: Secondary | ICD-10-CM | POA: Diagnosis not present

## 2022-12-25 DIAGNOSIS — F419 Anxiety disorder, unspecified: Secondary | ICD-10-CM | POA: Diagnosis not present

## 2022-12-25 DIAGNOSIS — F4310 Post-traumatic stress disorder, unspecified: Secondary | ICD-10-CM | POA: Diagnosis not present

## 2022-12-29 DIAGNOSIS — E782 Mixed hyperlipidemia: Secondary | ICD-10-CM | POA: Diagnosis not present

## 2022-12-29 DIAGNOSIS — Z79899 Other long term (current) drug therapy: Secondary | ICD-10-CM | POA: Diagnosis not present

## 2022-12-29 DIAGNOSIS — E119 Type 2 diabetes mellitus without complications: Secondary | ICD-10-CM | POA: Diagnosis not present

## 2022-12-29 DIAGNOSIS — D519 Vitamin B12 deficiency anemia, unspecified: Secondary | ICD-10-CM | POA: Diagnosis not present

## 2022-12-30 ENCOUNTER — Emergency Department
Admission: EM | Admit: 2022-12-30 | Discharge: 2022-12-30 | Disposition: A | Discharge status: Skilled Nursing Facility | Payer: Medicare HMO | Attending: Emergency Medicine | Admitting: Emergency Medicine

## 2022-12-30 DIAGNOSIS — N390 Urinary tract infection, site not specified: Secondary | ICD-10-CM | POA: Insufficient documentation

## 2022-12-30 DIAGNOSIS — I10 Essential (primary) hypertension: Secondary | ICD-10-CM | POA: Diagnosis not present

## 2022-12-30 DIAGNOSIS — Z743 Need for continuous supervision: Secondary | ICD-10-CM | POA: Diagnosis not present

## 2022-12-30 DIAGNOSIS — F22 Delusional disorders: Secondary | ICD-10-CM | POA: Insufficient documentation

## 2022-12-30 DIAGNOSIS — T50904A Poisoning by unspecified drugs, medicaments and biological substances, undetermined, initial encounter: Secondary | ICD-10-CM | POA: Diagnosis not present

## 2022-12-30 DIAGNOSIS — F419 Anxiety disorder, unspecified: Secondary | ICD-10-CM | POA: Diagnosis not present

## 2022-12-30 DIAGNOSIS — F29 Unspecified psychosis not due to a substance or known physiological condition: Secondary | ICD-10-CM | POA: Diagnosis not present

## 2022-12-30 DIAGNOSIS — L659 Nonscarring hair loss, unspecified: Secondary | ICD-10-CM | POA: Diagnosis not present

## 2022-12-30 DIAGNOSIS — E119 Type 2 diabetes mellitus without complications: Secondary | ICD-10-CM | POA: Diagnosis not present

## 2022-12-30 DIAGNOSIS — L409 Psoriasis, unspecified: Secondary | ICD-10-CM | POA: Diagnosis not present

## 2022-12-30 DIAGNOSIS — M19011 Primary osteoarthritis, right shoulder: Secondary | ICD-10-CM | POA: Diagnosis not present

## 2022-12-30 DIAGNOSIS — R3915 Urgency of urination: Secondary | ICD-10-CM | POA: Diagnosis not present

## 2022-12-30 DIAGNOSIS — B372 Candidiasis of skin and nail: Secondary | ICD-10-CM | POA: Diagnosis not present

## 2022-12-30 DIAGNOSIS — I110 Hypertensive heart disease with heart failure: Secondary | ICD-10-CM | POA: Diagnosis not present

## 2022-12-30 DIAGNOSIS — J309 Allergic rhinitis, unspecified: Secondary | ICD-10-CM | POA: Diagnosis not present

## 2022-12-30 DIAGNOSIS — I509 Heart failure, unspecified: Secondary | ICD-10-CM | POA: Diagnosis not present

## 2022-12-30 DIAGNOSIS — F4310 Post-traumatic stress disorder, unspecified: Secondary | ICD-10-CM | POA: Diagnosis not present

## 2022-12-30 DIAGNOSIS — G8929 Other chronic pain: Secondary | ICD-10-CM | POA: Diagnosis not present

## 2022-12-30 DIAGNOSIS — F410 Panic disorder [episodic paroxysmal anxiety] without agoraphobia: Secondary | ICD-10-CM | POA: Diagnosis not present

## 2022-12-30 DIAGNOSIS — K219 Gastro-esophageal reflux disease without esophagitis: Secondary | ICD-10-CM | POA: Diagnosis not present

## 2022-12-30 LAB — URINALYSIS, ROUTINE W REFLEX MICROSCOPIC
Bilirubin Urine: NEGATIVE
Glucose, UA: NEGATIVE mg/dL
Hgb urine dipstick: NEGATIVE
Ketones, ur: NEGATIVE mg/dL
Nitrite: NEGATIVE
Protein, ur: 30 mg/dL — AB
Specific Gravity, Urine: 1.018 (ref 1.005–1.030)
pH: 5 (ref 5.0–8.0)

## 2022-12-30 LAB — BASIC METABOLIC PANEL
Anion gap: 10 (ref 5–15)
BUN: 18 mg/dL (ref 8–23)
CO2: 22 mmol/L (ref 22–32)
Calcium: 8.6 mg/dL — ABNORMAL LOW (ref 8.9–10.3)
Chloride: 102 mmol/L (ref 98–111)
Creatinine, Ser: 0.79 mg/dL (ref 0.44–1.00)
GFR, Estimated: >60 mL/min (ref >60)
Glucose, Bld: 170 mg/dL — ABNORMAL HIGH (ref 70–99)
Potassium: 4.1 mmol/L (ref 3.5–5.1)
Sodium: 134 mmol/L — ABNORMAL LOW (ref 135–145)

## 2022-12-30 LAB — CBC
HCT: 41.8 % (ref 36.0–46.0)
Hemoglobin: 13.6 g/dL (ref 12.0–15.0)
MCH: 31 pg (ref 26.0–34.0)
MCHC: 32.5 g/dL (ref 30.0–36.0)
MCV: 95.2 fL (ref 80.0–100.0)
Platelets: 270 10*3/uL (ref 150–400)
RBC: 4.39 MIL/uL (ref 3.87–5.11)
RDW: 12.5 % (ref 11.5–15.5)
WBC: 8.9 10*3/uL (ref 4.0–10.5)
nRBC: 0 % (ref 0.0–0.2)

## 2022-12-30 MED ORDER — SODIUM CHLORIDE 0.9 % IV SOLN
1.0000 g | Freq: Once | INTRAVENOUS | Status: AC
Start: 2022-12-30 — End: 2022-12-30
  Administered 2022-12-30: 1 g via INTRAVENOUS
  Filled 2022-12-30: qty 10

## 2022-12-30 MED ORDER — CEPHALEXIN 500 MG PO CAPS: 500.0000 mg | ORAL_CAPSULE | Freq: Four times a day (QID) | ORAL | 0 refills | Status: AC

## 2022-12-30 NOTE — ED Provider Notes (Addendum)
[facility] Provider Note    Event Date/Time   First MD Initiated Contact with Patient 12/30/22 1141     (approximate)   History   Urinary tract infection  HPI  Dawn [name] is a 82 y.o. female who presents to the urgency department today because of concerns for possible urinary tract infection.  Patient states she still started developing back pain a couple of days ago.  The patient denies any painful urination or bad odor to her urine.  The patient denies any fevers.     Physical Exam   Triage Vital Signs: ED Triage Vitals  Encounter Vitals Group     BP 12/30/22 1015 (!) 154/81     Systolic BP Percentile --      Diastolic BP Percentile --      Pulse Rate 12/30/22 1015 91     Resp 12/30/22 1015 17     Temp 12/30/22 1015 98.4 F (36.9 C)     Temp Source 12/30/22 1015 Oral     SpO2 12/30/22 1021 97 %     Weight 12/30/22 1016 195 lb 1.7 oz (88.5 kg)     Height 12/30/22 1016 5\' 4"  (1.626 m)     Head Circumference --      Peak Flow --      Pain Score 12/30/22 1016 2     Pain Loc --      Pain Education --      Exclude from Growth Chart --     Most recent vital signs: Vitals:   12/30/22 1015 12/30/22 1021  BP: (!) 154/81   Pulse: 91   Resp: 17   Temp: 98.4 F (36.9 C)   SpO2:  97%   General: Awake, alert, oriented. CV:  Good peripheral perfusion. Regular rate and rhythm. Resp:  Normal effort. Lungs clear. Abd:  No distention. Non tender.  ED Results / Procedures / Treatments   Labs (all labs ordered are listed, but only abnormal results are displayed) Labs Reviewed  URINALYSIS, ROUTINE W REFLEX MICROSCOPIC - Abnormal; Notable for the following components:      Result Value   Color, Urine YELLOW (*)    APPearance HAZY (*)    Protein, ur 30 (*)    Leukocytes,Ua LARGE (*)    Bacteria, UA FEW (*)    All other components within normal limits  BASIC METABOLIC PANEL - Abnormal; Notable for the following components:   Sodium 134 (*)     Glucose, Bld 170 (*)    Calcium 8.6 (*)    All other components within normal limits  CBC     EKG  None   RADIOLOGY None   PROCEDURES:  Critical Care performed: No   MEDICATIONS ORDERED IN ED: Medications - No data to display   IMPRESSION / MDM / ASSESSMENT AND PLAN / ED COURSE  I reviewed the triage vital signs and the nursing notes.                              Differential diagnosis includes, but is not limited to, UTI, kidney stone  Patient's presentation is most consistent with acute presentation with potential threat to life or bodily function.   The patient is on the cardiac monitor to evaluate for evidence of arrhythmia and/or significant heart rate changes.  Patient presented to the emergency department today with concerns for back pain.  Urine is consistent with urinary  tract infection.  Patient does not give good clinical history without be concerning for kidney stone at this time.  Will give dose of antibiotics here in the emergency department.  Will plan on discharging with further antibiotics. Patient did request number for [place] social services.    Living facility requested psychiatric evaluation.   FINAL CLINICAL IMPRESSION(S) / ED DIAGNOSES   Final diagnoses:  Lower urinary tract infection    Note:  This document was prepared using Dragon voice recognition software and may include unintentional dictation errors.    Phineas Semen, MD 12/30/22 1350    Phineas Semen, MD 12/30/22 [phone]

## 2022-12-30 NOTE — Discharge Instructions (Addendum)
Please seek medical attention for any high fevers, chest pain, shortness of breath, change in behavior, persistent vomiting, bloody stool or any other new or concerning symptoms.  

## 2022-12-30 NOTE — ED Triage Notes (Addendum)
Pt here via [facility] from [facility]. Pt is fearful of the facility where she lives. Pt denies SI,HI. Hx of DM, depression, and anxiety. Pt c/o back pain, pt was dx with a kidney stone 2 days ago. Pt denies cp or sob.   95% RA 99 195-cbg 148/94

## 2022-12-30 NOTE — ED Notes (Signed)
Sister coming to pick pt up.

## 2022-12-30 NOTE — Consult Note (Addendum)
[facility] Face-to-Face Psychiatry Consult   Reason for Consult:  paranoia Referring Physician:  Dr. Derrill Kay Patient Identification: Dawn [name] MRN:  [id number] Principal Diagnosis: Paranoia (HCC) Diagnosis:  Principal Problem:   Paranoia (HCC)  Total Time spent with patient: 45 minutes  Subjective:   Dawn [name] is a 82 y.o. female patient w/ history of DDD, HTN, GAD, MDD, Panic Disorder, PTSD brought to [facility] from [facility]. Per initial triage note:  Pt here via [facility] from [facility]. Pt is fearful of the facility where she lives. Pt denies SI,HI. Hx of DM, depression, and anxiety. Pt c/o back pain, pt was dx with a kidney stone 2 days ago. Pt denies cp or sob.   95% RA 99 195-cbg 148/94  HPI:   Pt chart reviewed and assessed face to face. Pt is alert and oriented x4, in no acute distress, non toxic appearing. She is pleasant and cooperative. Reports she lives in an assisted living facility. She believes she may be the only resident there without dementia. States she has been there for approximately a year. Reports for the past year she has had concerns about the facility. States she presented to the ED last month for palpitations and believes the facility may be putting too much magnesium in her food. States she does continue to eat her food though. Reports staff at facility are also "bullying" residents, making them take their medications "in one gulp". Also states staff took her ipod away from her. She states there is a medication provider at the facility that speaks with her and manages her medications.  She denies suicidal, homicidal ideations. She denies auditory visual hallucinations or paranoia.   She denies history of suicide attempt, non suicidal self injurious behavior, or inpatient psychiatric admission.  Reports she takes effexor which is helpful for anxiety. States she was prescribed abilify before as an adjunct although discontinued since  she didn't feel it was any more helpful than the effexor alone. She states the facility dispenses medications to her.  She denies knowledge of family psychiatric history.  She denies use of alcohol, marijuana, crack/cocaine, nicotine, methamphetamines, other substances.   Spoke w/ pt's daughter-in-law Marcelino Duster, [phone]. Prior to being in the assisted living facility, pt was living on her own, couldn't maintain her housing, wasn't paying the bills, wasn't taking her medications. She states pt has been paranoid for the past year, feeling that people are moving her things around, breaking into her home. She and family wonder whether pt may have dementia. She denies safety concerns regarding pt being a danger to herself or others. She states she can pick pt up and bring her back to assisted living facility.  Past Psychiatric History: GAD, MDD, Panic Disorder, PTSD  Risk to Self: No Risk to Others: No Prior Inpatient Therapy: No Prior Outpatient Therapy: Yes  Past Medical History:  Past Medical History:  Diagnosis Date   CHF (congestive heart failure) (HCC)    Colitis, ischemic (HCC)    Fibromyalgia    Hypertension    Leaky heart valve    Lupus    Osteoarthritis    PTSD (post-traumatic stress disorder)    Rheumatoid arthritis (HCC)     Past Surgical History:  Procedure Laterality Date   TONSILLECTOMY     Family History:  Family History  Problem Relation Age of Onset   Cancer Mother    Alzheimer's disease Mother    Cancer Father        prostate  ADD / ADHD Son    Family Psychiatric  History: See above Social History:  Social History   Substance and Sexual Activity  Alcohol Use No     Social History   Substance and Sexual Activity  Drug Use Not Currently    Social History   Socioeconomic History   Marital status: Married    Spouse name: Not on file   Number of children: Not on file   Years of education: Not on file   Highest education level: Not on file   Occupational History   Not on file  Tobacco Use   Smoking status: Never   Smokeless tobacco: Not on file  Vaping Use   Vaping status: Never Used  Substance and Sexual Activity   Alcohol use: No   Drug use: Not Currently   Sexual activity: Not Currently  Other Topics Concern   Not on file  Social History Narrative   Not on file   Social Determinants of Health   Financial Resource Strain: Not on file  Food Insecurity: No Food Insecurity (07/22/2021)   Hunger Vital Sign    Worried About Running Out of Food in the Last Year: Never true    Ran Out of Food in the Last Year: Never true  Transportation Needs: Unmet Transportation Needs (10/03/2021)   PRAPARE - Transportation    Lack of Transportation (Medical): Yes    Lack of Transportation (Non-Medical): Yes  Physical Activity: Not on file  Stress: Stress Concern Present (07/22/2021)   [employer] of Occupational Health - Occupational Stress Questionnaire    Feeling of Stress : To some extent  Social Connections: Socially Isolated (07/22/2021)   Social Connection and Isolation Panel [NHANES]    Frequency of Communication with Friends and Family: Once a week    Frequency of Social Gatherings with Friends and Family: Once a week    Attends Religious Services: Never    Database administrator or Organizations: No    Attends Banker Meetings: Never    Marital Status: Divorced   Additional Social History:    Allergies:   Allergies  Allergen Reactions   Doxycycline Nausea And Vomiting   Nsaids Other (See Comments)    GI Bleed   Penicillin G Swelling   Sulfa Antibiotics Nausea And Vomiting    Labs:  Results for orders placed or performed during the hospital encounter of 12/30/22 (from the past 48 hour(s))  Urinalysis, Routine w reflex microscopic -Urine, Clean Catch     Status: Abnormal   Collection Time: 12/30/22 10:19 AM  Result Value Ref Range   Color, Urine YELLOW (A) YELLOW   APPearance HAZY (A) CLEAR    Specific Gravity, Urine 1.018 1.005 - 1.030   pH 5.0 5.0 - 8.0   Glucose, UA NEGATIVE NEGATIVE mg/dL   Hgb urine dipstick NEGATIVE NEGATIVE   Bilirubin Urine NEGATIVE NEGATIVE   Ketones, ur NEGATIVE NEGATIVE mg/dL   Protein, ur 30 (A) NEGATIVE mg/dL   Nitrite NEGATIVE NEGATIVE   Leukocytes,Ua LARGE (A) NEGATIVE   RBC / HPF 0-5 0 - 5 RBC/hpf   WBC, UA 21-50 0 - 5 WBC/hpf   Bacteria, UA FEW (A) NONE SEEN   Squamous Epithelial / HPF 6-10 0 - 5 /HPF   Mucus PRESENT    Hyaline Casts, UA PRESENT     Comment: Performed at [facility], [address], [place], [place] [address]  Basic metabolic panel     Status: Abnormal   Collection  Time: 12/30/22 10:19 AM  Result Value Ref Range   Sodium 134 (L) 135 - 145 mmol/L   Potassium 4.1 3.5 - 5.1 mmol/L   Chloride 102 98 - 111 mmol/L   CO2 22 22 - 32 mmol/L   Glucose, Bld 170 (H) 70 - 99 mg/dL    Comment: Glucose reference range applies only to samples taken after fasting for at least 8 hours.   BUN 18 8 - 23 mg/dL   Creatinine, Ser 1.61 0.44 - 1.00 mg/dL   Calcium 8.6 (L) 8.9 - 10.3 mg/dL   GFR, Estimated >09 >60 mL/min    Comment: (NOTE) Calculated using the CKD-EPI Creatinine Equation (2021)    Anion gap 10 5 - 15    Comment: Performed at [facility], [address] [place], [place] [address]  CBC     Status: None   Collection Time: 12/30/22 10:19 AM  Result Value Ref Range   WBC 8.9 4.0 - 10.5 K/uL   RBC 4.39 3.87 - 5.11 MIL/uL   Hemoglobin 13.6 12.0 - 15.0 g/dL   HCT 81.1 91.4 - 78.2 %   MCV 95.2 80.0 - 100.0 fL   MCH 31.0 26.0 - 34.0 pg   MCHC 32.5 30.0 - 36.0 g/dL   RDW 95.6 21.3 - 08.6 %   Platelets 270 150 - 400 K/uL   nRBC 0.0 0.0 - 0.2 %    Comment: Performed at [facility], [address] [place], [place] [address]    No current facility-administered medications for this encounter.   Current Outpatient Medications  Medication Sig Dispense Refill   amLODipine (NORVASC) 5 MG tablet  Take 5 mg by mouth daily.     Biotin 2.5 MG CAPS Take 10 capsules by mouth 3 (three) times daily.     busPIRone (BUSPAR) 15 MG tablet Take 15 mg by mouth 3 (three) times daily.     cephALEXin (KEFLEX) 500 MG capsule Take 1 capsule (500 mg total) by mouth 4 (four) times daily for 10 days. 40 capsule 0   ciprofloxacin (CIPRO) 500 MG tablet Take 500 mg by mouth 2 (two) times daily.     gabapentin (NEURONTIN) 100 MG capsule Take 100 mg by mouth 3 (three) times daily.     metFORMIN (GLUCOPHAGE) 850 MG tablet Take 850 mg by mouth 2 (two) times daily.     acetaminophen (TYLENOL) 650 MG CR tablet Take 650 mg by mouth every 8 (eight) hours as needed for pain.     ARIPiprazole (ABILIFY) 5 MG tablet Take 1 tablet (5 mg total) by mouth daily. 90 tablet 0   cetirizine (ZYRTEC) 10 MG tablet Take 10 mg by mouth daily.     hydrALAZINE (APRESOLINE) 10 MG tablet Take 10 mg by mouth 3 (three) times daily.     hydrOXYzine (ATARAX) 10 MG tablet Take 1 tablet (10 mg total) by mouth daily as needed for anxiety. 90 tablet 0   metFORMIN (GLUCOPHAGE) 500 MG tablet Take 1 tablet (500 mg total) by mouth 2 (two) times daily with a meal. 180 tablet 3   NYSTATIN powder Apply 1 Application topically 2 (two) times daily.     pantoprazole (PROTONIX) 40 MG tablet Take 1 tablet (40 mg total) by mouth daily. 90 tablet 1   venlafaxine XR (EFFEXOR XR) 150 MG 24 hr capsule Take 150 mg by mouth every morning. 90 capsule 1   venlafaxine XR (EFFEXOR XR) 75 MG 24 hr capsule Take 75 mg in every  evening. 90 capsule 1    Musculoskeletal: Strength & Muscle Tone:  sitting on exam Gait & Station:  sitting on exam Patient leans:  sitting on exam            Psychiatric Specialty Exam:  Presentation  General Appearance:  Appropriate for Environment; Casual; Fairly Groomed  Eye Contact: Fair  Speech: Clear and Coherent; Normal Rate  Speech Volume: Normal  Handedness: Right   Mood and Affect  Mood: --  ("good")  Affect: Full Range   Thought Process  Thought Processes: Coherent; Goal Directed; Linear  Descriptions of Associations:Intact  Orientation:Full (Time, Place and Person)  Thought Content:Paranoid Ideation  History of Schizophrenia/Schizoaffective disorder:No  Duration of Psychotic Symptoms:No data recorded Hallucinations:Hallucinations: None  Ideas of Reference:Paranoia  Suicidal Thoughts:Suicidal Thoughts: No  Homicidal Thoughts:Homicidal Thoughts: No   Sensorium  Memory: Immediate Fair  Judgment: Fair  Insight: Shallow   Executive Functions  Concentration: Fair  Attention Span: Fair  Recall: Fair  Fund of Knowledge: Fair  Language: Fair   Psychomotor Activity  Psychomotor Activity: Psychomotor Activity: Normal   Assets  Assets: Communication Skills; Desire for Improvement; Financial Resources/Insurance; Social Support; Resilience   Sleep  Sleep: Sleep: Fair   Physical Exam: Physical Exam Constitutional:      General: She is not in acute distress.    Appearance: She is not ill-appearing, toxic-appearing or diaphoretic.  Eyes:     General: No scleral icterus. Cardiovascular:     Rate and Rhythm: Normal rate.  Pulmonary:     Effort: Pulmonary effort is normal. No respiratory distress.  Neurological:     Mental Status: She is alert and oriented to person, place, and time.  Psychiatric:        Attention and Perception: Attention and perception normal.        Mood and Affect: Mood normal.        Speech: Speech normal.        Behavior: Behavior normal. Behavior is cooperative.        Thought Content: Thought content is paranoid. Thought content does not include homicidal or suicidal ideation.    Review of Systems  Constitutional:  Negative for chills and fever.  Respiratory:  Negative for shortness of breath.   Cardiovascular:  Negative for chest pain and palpitations.  Gastrointestinal:  Negative for abdominal pain.   Neurological:  Negative for headaches.  Psychiatric: Denies suicidal, homicidal ideations. Denies auditory visual hallucinations. +paranoia.   Blood pressure (!) 154/81, pulse 91, temperature 98.4 F (36.9 C), temperature source Oral, resp. rate 17, height 5\' 4"  (1.626 m), weight 88.5 kg, SpO2 97%. Body mass index is 33.49 kg/m.  Treatment Plan Summary: Medication management and Plan    82 y/o female w/ history of DDD, HTN, GAD, MDD, Panic Disorder, PTSD admitted to [facility]. Pt +UTI. Pt +paranoia regarding assisted living facility. Possible that paranoia could be result of UTI although collateral report suggests that paranoia was present prior to UTI.  Pt previously on low dose of abilify 5mg , reports this was adjunctive therapy for anxiety, depression rather than psychosis/mania. Collateral also expresses concerns about dementia.  Collateral denies safety concerns with discharge today and states she can pick pt up from facility. Case staffed with attending psychiatrist, Dr. Marval Regal - pt does not meet criteria for inpatient psychiatric admission, pt to follow up with outpatient provider for continued follow up.  Disposition: No evidence of imminent risk to self or others at present.   Patient does not meet criteria for  psychiatric inpatient admission. Supportive therapy provided about ongoing stressors. Discussed crisis plan, support from social network, calling 911, coming to the Emergency Department, and calling Suicide Hotline.  Lauree Chandler, NP 12/30/2022 3:03 PM

## 2022-12-30 NOTE — BH Assessment (Signed)
Comprehensive Clinical Assessment (CCA) Screening, Triage and Referral Note  12/30/2022 Dawn [name] [id number]  Dawn [name], 82 year old female who presents to [facility] ED involuntarily for treatment. Per triage note, Pt here via [facility] from [facility]. Pt is fearful of the facility where she lives. Pt denies SI,HI. Hx of DM, depression, and anxiety. Pt c/o back pain, pt was dx with a kidney stone 2 days ago. Pt denies cp or sob.   During TTS assessment pt presents alert and oriented x 4, restless but cooperative, and mood-congruent with affect. The pt does not appear to be responding to internal or external stimuli. Neither is the pt presenting with any delusional thinking. Pt verified the information provided to triage RN.   Pt identifies her main complaint to be that she lives in an assisted living facility where she is the only patient that does not have dementia. Patient reports she has been living there for a year and believes the staff may be putting too much magnesium in her food. She also reports the staff has been "bullying" her and threatening that if she does not swallow her medicine in one gulp, she will be transferred to "[facility] which she says is another facility. Patient reports she has PTSD and takes Effexor to help with her anxiety. Patient denies using any illicit substances and alcohol. Pt reports no INPT hx. Pt denies current  SI/HI/AH/VH. Pt contracts for safety. Pt provided her daughter in law, Marcelino Duster as a collateral contact.   Per Annice Pih, NP: Spoke w/ pt's daughter-in-law Marcelino Duster, [phone]. Prior to being in the assisted living facility, pt was living on her own, couldn't maintain her housing, wasn't paying the bills, wasn't taking her medications. She states pt has been paranoid for the past year, feeling that people are moving her things around, breaking into her home. She and family wonder whether pt may have dementia. She denies safety concerns  regarding pt being a danger to herself or others. She states she can pick pt up and bring her back to the assisted living facility.   Per Annice Pih, NP, pt shows no evidence of imminent risk to self or others at present and does not meet criteria for psychiatric inpatient admission.   Chief Complaint:  Chief Complaint  Patient presents with   Paranoid   Visit Diagnosis: Paranoia  Patient Reported Information How did you hear about [place]? -- (EMS)  What Is the Reason for Your Visit/Call Today? Patient was brought to ED for UTI.  How Long Has This Been Causing You Problems? <Week  What Do You Feel Would Help You the Most Today? -- (Assessment only)   Have You Recently Had Any Thoughts About Hurting Yourself? No  Are You Planning to Commit Suicide/Harm Yourself At This time? No   Have you Recently Had Thoughts About Hurting Someone Karolee Ohs? No  Are You Planning to Harm Someone at This Time? No  Explanation: No data recorded  Have You Used Any Alcohol or Drugs in the Past 24 Hours? No  How Long Ago Did You Use Drugs or Alcohol? No data recorded What Did You Use and How Much? No data recorded  Do You Currently Have a Therapist/Psychiatrist? No  Name of Therapist/Psychiatrist: No data recorded  Have You Been Recently Discharged From Any Office Practice or Programs? No  Explanation of Discharge From Practice/Program: No data recorded   CCA Screening Triage Referral Assessment Type of Contact: Face-to-Face  Telemedicine Service Delivery:   Is this  Initial or Reassessment?   Date Telepsych consult ordered in [facility]:    Time Telepsych consult ordered in [facility]:    Location of Assessment: [facility] ED  Provider Location: [facility] ED    Collateral Involvement: Daughter in law, Marcelino Duster   Does Patient Have a Automotive engineer Guardian? No data recorded Name and Contact of Legal Guardian: No data recorded If Minor and Not Living with Parent(s), Who has Custody? No data recorded Is CPS  involved or ever been involved? Never  Is APS involved or ever been involved? Never   Patient Determined To Be At Risk for Harm To Self or Others Based on Review of Patient Reported Information or Presenting Complaint? No  Method: No Plan  Availability of Means: No access or NA  Intent: Vague intent or NA  Notification Required: No need or identified person  Additional Information for Danger to Others Potential: No data recorded Additional Comments for Danger to Others Potential: No data recorded Are There Guns or Other Weapons in Your Home? No  Types of Guns/Weapons: No data recorded Are These Weapons Safely Secured?                            No data recorded Who Could Verify You Are Able To Have These Secured: No data recorded Do You Have any Outstanding Charges, Pending Court Dates, Parole/Probation? No data recorded Contacted To Inform of Risk of Harm To Self or Others: No data recorded  Does Patient Present under Involuntary Commitment? No    [place] of Residence: [place]   Patient Currently Receiving the Following Services: Medication Management; Skilled Nursing Facility   Determination of Need: Emergent (2 hours)   Options For Referral: ED Visit; Medication Management; Outpatient Therapy; ALF/SNF   Discharge Disposition:     Clerance Lav, Counselor, LCAS-A

## 2023-01-02 LAB — URINE CULTURE: Culture: 20000 — AB

## 2023-01-08 DIAGNOSIS — F03A4 Unspecified dementia, mild, with anxiety: Secondary | ICD-10-CM | POA: Diagnosis not present

## 2023-01-08 DIAGNOSIS — F03918 Unspecified dementia, unspecified severity, with other behavioral disturbance: Secondary | ICD-10-CM | POA: Diagnosis not present

## 2023-01-08 DIAGNOSIS — F419 Anxiety disorder, unspecified: Secondary | ICD-10-CM | POA: Diagnosis not present

## 2023-01-08 DIAGNOSIS — F32A Depression, unspecified: Secondary | ICD-10-CM | POA: Diagnosis not present

## 2023-01-08 DIAGNOSIS — F4310 Post-traumatic stress disorder, unspecified: Secondary | ICD-10-CM | POA: Diagnosis not present

## 2023-01-09 DIAGNOSIS — F4310 Post-traumatic stress disorder, unspecified: Secondary | ICD-10-CM | POA: Diagnosis not present

## 2023-01-09 DIAGNOSIS — F419 Anxiety disorder, unspecified: Secondary | ICD-10-CM | POA: Diagnosis not present

## 2023-01-09 DIAGNOSIS — F03918 Unspecified dementia, unspecified severity, with other behavioral disturbance: Secondary | ICD-10-CM | POA: Diagnosis not present

## 2023-01-09 DIAGNOSIS — F03A4 Unspecified dementia, mild, with anxiety: Secondary | ICD-10-CM | POA: Diagnosis not present

## 2023-01-09 DIAGNOSIS — F32A Depression, unspecified: Secondary | ICD-10-CM | POA: Diagnosis not present

## 2023-01-09 DIAGNOSIS — F02A18 Dementia in other diseases classified elsewhere, mild, with other behavioral disturbance: Secondary | ICD-10-CM | POA: Diagnosis not present

## 2023-01-14 DIAGNOSIS — D519 Vitamin B12 deficiency anemia, unspecified: Secondary | ICD-10-CM | POA: Diagnosis not present

## 2023-01-14 DIAGNOSIS — E038 Other specified hypothyroidism: Secondary | ICD-10-CM | POA: Diagnosis not present

## 2023-01-14 DIAGNOSIS — I70223 Atherosclerosis of native arteries of extremities with rest pain, bilateral legs: Secondary | ICD-10-CM | POA: Diagnosis not present

## 2023-01-14 DIAGNOSIS — I10 Essential (primary) hypertension: Secondary | ICD-10-CM | POA: Diagnosis not present

## 2023-01-14 DIAGNOSIS — E782 Mixed hyperlipidemia: Secondary | ICD-10-CM | POA: Diagnosis not present

## 2023-01-14 DIAGNOSIS — E119 Type 2 diabetes mellitus without complications: Secondary | ICD-10-CM | POA: Diagnosis not present

## 2023-01-14 DIAGNOSIS — E559 Vitamin D deficiency, unspecified: Secondary | ICD-10-CM | POA: Diagnosis not present

## 2023-01-16 DIAGNOSIS — J449 Chronic obstructive pulmonary disease, unspecified: Secondary | ICD-10-CM | POA: Diagnosis not present

## 2023-01-19 ENCOUNTER — Telehealth: Payer: Self-pay

## 2023-01-19 DIAGNOSIS — F32A Depression, unspecified: Secondary | ICD-10-CM | POA: Diagnosis not present

## 2023-01-19 DIAGNOSIS — F03918 Unspecified dementia, unspecified severity, with other behavioral disturbance: Secondary | ICD-10-CM | POA: Diagnosis not present

## 2023-01-19 DIAGNOSIS — F419 Anxiety disorder, unspecified: Secondary | ICD-10-CM | POA: Diagnosis not present

## 2023-01-19 DIAGNOSIS — F03A4 Unspecified dementia, mild, with anxiety: Secondary | ICD-10-CM | POA: Diagnosis not present

## 2023-01-19 DIAGNOSIS — F4310 Post-traumatic stress disorder, unspecified: Secondary | ICD-10-CM | POA: Diagnosis not present

## 2023-01-19 NOTE — Telephone Encounter (Signed)
Transition Care Management Unsuccessful Follow-up Telephone Call  Date of discharge and from where:  [place] 11/12  Attempts:  1st Attempt  Reason for unsuccessful TCM follow-up call:  No answer/busy   Lenard Forth [place]  [facility], [facility] Guide, Phone: [phone] Website: [email or web address]

## 2023-01-20 ENCOUNTER — Telehealth: Payer: Self-pay

## 2023-01-20 NOTE — Telephone Encounter (Signed)
Transition Care Management Unsuccessful Follow-up Telephone Call  Date of discharge and from where:  [place] 11/12  Attempts:  2nd Attempt  Reason for unsuccessful TCM follow-up call:  No answer/busy   Lenard Forth [place]  [facility], [facility] Guide, Phone: [phone] Website: [email or web address]

## 2023-01-21 DIAGNOSIS — B372 Candidiasis of skin and nail: Secondary | ICD-10-CM | POA: Diagnosis not present

## 2023-01-21 DIAGNOSIS — L659 Nonscarring hair loss, unspecified: Secondary | ICD-10-CM | POA: Diagnosis not present

## 2023-01-21 DIAGNOSIS — F410 Panic disorder [episodic paroxysmal anxiety] without agoraphobia: Secondary | ICD-10-CM | POA: Diagnosis not present

## 2023-01-21 DIAGNOSIS — L409 Psoriasis, unspecified: Secondary | ICD-10-CM | POA: Diagnosis not present

## 2023-01-21 DIAGNOSIS — G8929 Other chronic pain: Secondary | ICD-10-CM | POA: Diagnosis not present

## 2023-01-21 DIAGNOSIS — F22 Delusional disorders: Secondary | ICD-10-CM | POA: Diagnosis not present

## 2023-01-21 DIAGNOSIS — N390 Urinary tract infection, site not specified: Secondary | ICD-10-CM | POA: Diagnosis not present

## 2023-01-21 DIAGNOSIS — K219 Gastro-esophageal reflux disease without esophagitis: Secondary | ICD-10-CM | POA: Diagnosis not present

## 2023-01-21 DIAGNOSIS — E119 Type 2 diabetes mellitus without complications: Secondary | ICD-10-CM | POA: Diagnosis not present

## 2023-01-21 DIAGNOSIS — F4310 Post-traumatic stress disorder, unspecified: Secondary | ICD-10-CM | POA: Diagnosis not present

## 2023-01-21 DIAGNOSIS — J309 Allergic rhinitis, unspecified: Secondary | ICD-10-CM | POA: Diagnosis not present

## 2023-01-21 DIAGNOSIS — M19011 Primary osteoarthritis, right shoulder: Secondary | ICD-10-CM | POA: Diagnosis not present

## 2023-01-27 DIAGNOSIS — F419 Anxiety disorder, unspecified: Secondary | ICD-10-CM | POA: Diagnosis not present

## 2023-01-27 DIAGNOSIS — F32A Depression, unspecified: Secondary | ICD-10-CM | POA: Diagnosis not present

## 2023-01-27 DIAGNOSIS — F03B18 Unspecified dementia, moderate, with other behavioral disturbance: Secondary | ICD-10-CM | POA: Diagnosis not present

## 2023-01-27 DIAGNOSIS — F4310 Post-traumatic stress disorder, unspecified: Secondary | ICD-10-CM | POA: Diagnosis not present

## 2023-02-03 DIAGNOSIS — G8929 Other chronic pain: Secondary | ICD-10-CM | POA: Diagnosis not present

## 2023-02-03 DIAGNOSIS — R0982 Postnasal drip: Secondary | ICD-10-CM | POA: Diagnosis not present

## 2023-02-03 DIAGNOSIS — E559 Vitamin D deficiency, unspecified: Secondary | ICD-10-CM | POA: Diagnosis not present

## 2023-02-03 DIAGNOSIS — R062 Wheezing: Secondary | ICD-10-CM | POA: Diagnosis not present

## 2023-02-03 DIAGNOSIS — J309 Allergic rhinitis, unspecified: Secondary | ICD-10-CM | POA: Diagnosis not present

## 2023-02-03 DIAGNOSIS — E782 Mixed hyperlipidemia: Secondary | ICD-10-CM | POA: Diagnosis not present

## 2023-02-03 DIAGNOSIS — K219 Gastro-esophageal reflux disease without esophagitis: Secondary | ICD-10-CM | POA: Diagnosis not present

## 2023-02-03 DIAGNOSIS — E119 Type 2 diabetes mellitus without complications: Secondary | ICD-10-CM | POA: Diagnosis not present

## 2023-02-03 DIAGNOSIS — M19011 Primary osteoarthritis, right shoulder: Secondary | ICD-10-CM | POA: Diagnosis not present

## 2023-02-03 DIAGNOSIS — D519 Vitamin B12 deficiency anemia, unspecified: Secondary | ICD-10-CM | POA: Diagnosis not present

## 2023-02-03 DIAGNOSIS — I70223 Atherosclerosis of native arteries of extremities with rest pain, bilateral legs: Secondary | ICD-10-CM | POA: Diagnosis not present

## 2023-02-03 DIAGNOSIS — E038 Other specified hypothyroidism: Secondary | ICD-10-CM | POA: Diagnosis not present

## 2023-02-03 DIAGNOSIS — I10 Essential (primary) hypertension: Secondary | ICD-10-CM | POA: Diagnosis not present

## 2023-02-05 DIAGNOSIS — F32A Depression, unspecified: Secondary | ICD-10-CM | POA: Diagnosis not present

## 2023-02-05 DIAGNOSIS — F03B18 Unspecified dementia, moderate, with other behavioral disturbance: Secondary | ICD-10-CM | POA: Diagnosis not present

## 2023-02-05 DIAGNOSIS — F4310 Post-traumatic stress disorder, unspecified: Secondary | ICD-10-CM | POA: Diagnosis not present

## 2023-02-05 DIAGNOSIS — F419 Anxiety disorder, unspecified: Secondary | ICD-10-CM | POA: Diagnosis not present

## 2023-02-15 DIAGNOSIS — J449 Chronic obstructive pulmonary disease, unspecified: Secondary | ICD-10-CM | POA: Diagnosis not present

## 2023-02-17 DIAGNOSIS — F329 Major depressive disorder, single episode, unspecified: Secondary | ICD-10-CM | POA: Diagnosis not present

## 2023-02-17 DIAGNOSIS — F4310 Post-traumatic stress disorder, unspecified: Secondary | ICD-10-CM | POA: Diagnosis not present

## 2023-02-17 DIAGNOSIS — G8929 Other chronic pain: Secondary | ICD-10-CM | POA: Diagnosis not present

## 2023-02-17 DIAGNOSIS — F419 Anxiety disorder, unspecified: Secondary | ICD-10-CM | POA: Diagnosis not present

## 2023-02-17 DIAGNOSIS — R1310 Dysphagia, unspecified: Secondary | ICD-10-CM | POA: Diagnosis not present

## 2023-02-17 DIAGNOSIS — E119 Type 2 diabetes mellitus without complications: Secondary | ICD-10-CM | POA: Diagnosis not present

## 2023-02-17 DIAGNOSIS — L409 Psoriasis, unspecified: Secondary | ICD-10-CM | POA: Diagnosis not present

## 2023-02-17 DIAGNOSIS — K219 Gastro-esophageal reflux disease without esophagitis: Secondary | ICD-10-CM | POA: Diagnosis not present

## 2023-02-23 DIAGNOSIS — Z79899 Other long term (current) drug therapy: Secondary | ICD-10-CM | POA: Diagnosis not present

## 2023-02-23 DIAGNOSIS — E782 Mixed hyperlipidemia: Secondary | ICD-10-CM | POA: Diagnosis not present

## 2023-02-23 DIAGNOSIS — E119 Type 2 diabetes mellitus without complications: Secondary | ICD-10-CM | POA: Diagnosis not present

## 2023-02-23 DIAGNOSIS — E038 Other specified hypothyroidism: Secondary | ICD-10-CM | POA: Diagnosis not present

## 2023-02-23 DIAGNOSIS — D519 Vitamin B12 deficiency anemia, unspecified: Secondary | ICD-10-CM | POA: Diagnosis not present

## 2023-03-03 DIAGNOSIS — R1310 Dysphagia, unspecified: Secondary | ICD-10-CM | POA: Diagnosis not present

## 2023-03-03 DIAGNOSIS — L659 Nonscarring hair loss, unspecified: Secondary | ICD-10-CM | POA: Diagnosis not present

## 2023-03-03 DIAGNOSIS — E119 Type 2 diabetes mellitus without complications: Secondary | ICD-10-CM | POA: Diagnosis not present

## 2023-03-03 DIAGNOSIS — L409 Psoriasis, unspecified: Secondary | ICD-10-CM | POA: Diagnosis not present

## 2023-03-03 DIAGNOSIS — K219 Gastro-esophageal reflux disease without esophagitis: Secondary | ICD-10-CM | POA: Diagnosis not present

## 2023-03-03 DIAGNOSIS — G8929 Other chronic pain: Secondary | ICD-10-CM | POA: Diagnosis not present

## 2023-03-03 DIAGNOSIS — M19011 Primary osteoarthritis, right shoulder: Secondary | ICD-10-CM | POA: Diagnosis not present

## 2023-03-03 DIAGNOSIS — F329 Major depressive disorder, single episode, unspecified: Secondary | ICD-10-CM | POA: Diagnosis not present

## 2023-03-03 DIAGNOSIS — F410 Panic disorder [episodic paroxysmal anxiety] without agoraphobia: Secondary | ICD-10-CM | POA: Diagnosis not present

## 2023-03-03 DIAGNOSIS — J309 Allergic rhinitis, unspecified: Secondary | ICD-10-CM | POA: Diagnosis not present

## 2023-03-13 DIAGNOSIS — E038 Other specified hypothyroidism: Secondary | ICD-10-CM | POA: Diagnosis not present

## 2023-03-13 DIAGNOSIS — D519 Vitamin B12 deficiency anemia, unspecified: Secondary | ICD-10-CM | POA: Diagnosis not present

## 2023-03-13 DIAGNOSIS — E559 Vitamin D deficiency, unspecified: Secondary | ICD-10-CM | POA: Diagnosis not present

## 2023-03-13 DIAGNOSIS — I10 Essential (primary) hypertension: Secondary | ICD-10-CM | POA: Diagnosis not present

## 2023-03-13 DIAGNOSIS — I70223 Atherosclerosis of native arteries of extremities with rest pain, bilateral legs: Secondary | ICD-10-CM | POA: Diagnosis not present

## 2023-03-13 DIAGNOSIS — H2513 Age-related nuclear cataract, bilateral: Secondary | ICD-10-CM | POA: Diagnosis not present

## 2023-03-13 DIAGNOSIS — E782 Mixed hyperlipidemia: Secondary | ICD-10-CM | POA: Diagnosis not present

## 2023-03-13 DIAGNOSIS — E119 Type 2 diabetes mellitus without complications: Secondary | ICD-10-CM | POA: Diagnosis not present

## 2023-03-18 DIAGNOSIS — J449 Chronic obstructive pulmonary disease, unspecified: Secondary | ICD-10-CM | POA: Diagnosis not present

## 2023-03-31 DIAGNOSIS — F329 Major depressive disorder, single episode, unspecified: Secondary | ICD-10-CM | POA: Diagnosis not present

## 2023-03-31 DIAGNOSIS — E119 Type 2 diabetes mellitus without complications: Secondary | ICD-10-CM | POA: Diagnosis not present

## 2023-03-31 DIAGNOSIS — R1310 Dysphagia, unspecified: Secondary | ICD-10-CM | POA: Diagnosis not present

## 2023-03-31 DIAGNOSIS — L409 Psoriasis, unspecified: Secondary | ICD-10-CM | POA: Diagnosis not present

## 2023-03-31 DIAGNOSIS — K219 Gastro-esophageal reflux disease without esophagitis: Secondary | ICD-10-CM | POA: Diagnosis not present

## 2023-03-31 DIAGNOSIS — G8929 Other chronic pain: Secondary | ICD-10-CM | POA: Diagnosis not present

## 2023-04-01 ENCOUNTER — Other Ambulatory Visit: Payer: Self-pay

## 2023-04-01 DIAGNOSIS — I509 Heart failure, unspecified: Secondary | ICD-10-CM | POA: Insufficient documentation

## 2023-04-01 DIAGNOSIS — F32A Depression, unspecified: Secondary | ICD-10-CM | POA: Diagnosis not present

## 2023-04-01 DIAGNOSIS — F419 Anxiety disorder, unspecified: Secondary | ICD-10-CM | POA: Diagnosis not present

## 2023-04-01 DIAGNOSIS — K5730 Diverticulosis of large intestine without perforation or abscess without bleeding: Secondary | ICD-10-CM | POA: Diagnosis not present

## 2023-04-01 DIAGNOSIS — R945 Abnormal results of liver function studies: Secondary | ICD-10-CM | POA: Insufficient documentation

## 2023-04-01 DIAGNOSIS — K8020 Calculus of gallbladder without cholecystitis without obstruction: Secondary | ICD-10-CM | POA: Diagnosis not present

## 2023-04-01 DIAGNOSIS — R1084 Generalized abdominal pain: Secondary | ICD-10-CM | POA: Diagnosis not present

## 2023-04-01 DIAGNOSIS — D259 Leiomyoma of uterus, unspecified: Secondary | ICD-10-CM | POA: Diagnosis not present

## 2023-04-01 DIAGNOSIS — I10 Essential (primary) hypertension: Secondary | ICD-10-CM | POA: Diagnosis not present

## 2023-04-01 DIAGNOSIS — Z20822 Contact with and (suspected) exposure to covid-19: Secondary | ICD-10-CM | POA: Insufficient documentation

## 2023-04-01 DIAGNOSIS — R1032 Left lower quadrant pain: Secondary | ICD-10-CM | POA: Diagnosis not present

## 2023-04-01 DIAGNOSIS — B9689 Other specified bacterial agents as the cause of diseases classified elsewhere: Secondary | ICD-10-CM | POA: Diagnosis not present

## 2023-04-01 DIAGNOSIS — K529 Noninfective gastroenteritis and colitis, unspecified: Secondary | ICD-10-CM | POA: Diagnosis not present

## 2023-04-01 DIAGNOSIS — Z743 Need for continuous supervision: Secondary | ICD-10-CM | POA: Diagnosis not present

## 2023-04-01 DIAGNOSIS — F03B18 Unspecified dementia, moderate, with other behavioral disturbance: Secondary | ICD-10-CM | POA: Diagnosis not present

## 2023-04-01 DIAGNOSIS — E119 Type 2 diabetes mellitus without complications: Secondary | ICD-10-CM | POA: Insufficient documentation

## 2023-04-01 DIAGNOSIS — N390 Urinary tract infection, site not specified: Secondary | ICD-10-CM | POA: Diagnosis not present

## 2023-04-01 DIAGNOSIS — F4310 Post-traumatic stress disorder, unspecified: Secondary | ICD-10-CM | POA: Diagnosis not present

## 2023-04-01 LAB — LIPASE, BLOOD: Lipase: 26 U/L (ref 11–51)

## 2023-04-01 LAB — COMPREHENSIVE METABOLIC PANEL
ALT: 92 U/L — ABNORMAL HIGH (ref 0–44)
AST: 166 U/L — ABNORMAL HIGH (ref 15–41)
Albumin: 3.6 g/dL (ref 3.5–5.0)
Alkaline Phosphatase: 133 U/L — ABNORMAL HIGH (ref 38–126)
Anion gap: 11 (ref 5–15)
BUN: 25 mg/dL — ABNORMAL HIGH (ref 8–23)
CO2: 23 mmol/L (ref 22–32)
Calcium: 8.5 mg/dL — ABNORMAL LOW (ref 8.9–10.3)
Chloride: 100 mmol/L (ref 98–111)
Creatinine, Ser: 1.03 mg/dL — ABNORMAL HIGH (ref 0.44–1.00)
GFR, Estimated: 54 mL/min — ABNORMAL LOW (ref >60)
Glucose, Bld: 181 mg/dL — ABNORMAL HIGH (ref 70–99)
Potassium: 3.8 mmol/L (ref 3.5–5.1)
Sodium: 134 mmol/L — ABNORMAL LOW (ref 135–145)
Total Bilirubin: 0.8 mg/dL (ref 0.0–1.2)
Total Protein: 6.4 g/dL — ABNORMAL LOW (ref 6.5–8.1)

## 2023-04-01 LAB — CBC
HCT: 41 % (ref 36.0–46.0)
Hemoglobin: 13.2 g/dL (ref 12.0–15.0)
MCH: 31.1 pg (ref 26.0–34.0)
MCHC: 32.2 g/dL (ref 30.0–36.0)
MCV: 96.7 fL (ref 80.0–100.0)
Platelets: 229 10*3/uL (ref 150–400)
RBC: 4.24 MIL/uL (ref 3.87–5.11)
RDW: 13.1 % (ref 11.5–15.5)
WBC: 11.7 10*3/uL — ABNORMAL HIGH (ref 4.0–10.5)
nRBC: 0 % (ref 0.0–0.2)

## 2023-04-01 NOTE — ED Triage Notes (Signed)
Pt to ed via ems from [facility], pt reports LLQ abd pain that began tonight around 1830. Pt states she also has some nausea. Pt denies cough congestion or fever.

## 2023-04-02 ENCOUNTER — Emergency Department: Payer: Medicare HMO

## 2023-04-02 ENCOUNTER — Emergency Department
Admission: EM | Admit: 2023-04-02 | Discharge: 2023-04-02 | Disposition: A | Discharge status: Home / Self Care | Payer: Medicare HMO | Attending: Emergency Medicine | Admitting: Emergency Medicine

## 2023-04-02 DIAGNOSIS — K8020 Calculus of gallbladder without cholecystitis without obstruction: Secondary | ICD-10-CM | POA: Diagnosis not present

## 2023-04-02 DIAGNOSIS — R7989 Other specified abnormal findings of blood chemistry: Secondary | ICD-10-CM

## 2023-04-02 DIAGNOSIS — K5730 Diverticulosis of large intestine without perforation or abscess without bleeding: Secondary | ICD-10-CM | POA: Diagnosis not present

## 2023-04-02 DIAGNOSIS — K529 Noninfective gastroenteritis and colitis, unspecified: Secondary | ICD-10-CM

## 2023-04-02 DIAGNOSIS — R1032 Left lower quadrant pain: Secondary | ICD-10-CM | POA: Diagnosis not present

## 2023-04-02 DIAGNOSIS — N390 Urinary tract infection, site not specified: Secondary | ICD-10-CM

## 2023-04-02 DIAGNOSIS — D259 Leiomyoma of uterus, unspecified: Secondary | ICD-10-CM | POA: Diagnosis not present

## 2023-04-02 LAB — URINALYSIS, ROUTINE W REFLEX MICROSCOPIC
Bilirubin Urine: NEGATIVE
Glucose, UA: NEGATIVE mg/dL
Hgb urine dipstick: NEGATIVE
Ketones, ur: NEGATIVE mg/dL
Nitrite: NEGATIVE
Protein, ur: 30 mg/dL — AB
Specific Gravity, Urine: 1.02 (ref 1.005–1.030)
WBC, UA: >50 WBC/hpf (ref 0–5)
pH: 5 (ref 5.0–8.0)

## 2023-04-02 LAB — RESP PANEL BY RT-PCR (RSV, FLU A&B, COVID)  RVPGX2
Influenza A by PCR: NEGATIVE
Influenza B by PCR: NEGATIVE
Resp Syncytial Virus by PCR: NEGATIVE
SARS Coronavirus 2 by RT PCR: NEGATIVE

## 2023-04-02 MED ORDER — METRONIDAZOLE 500 MG PO TABS: 500.0000 mg | ORAL_TABLET | Freq: Three times a day (TID) | ORAL | 0 refills | Status: DC

## 2023-04-02 MED ORDER — CEFDINIR 300 MG PO CAPS: 300.0000 mg | ORAL_CAPSULE | Freq: Two times a day (BID) | ORAL | 0 refills | Status: AC

## 2023-04-02 MED ORDER — ONDANSETRON HCL 4 MG/2ML IJ SOLN
4.0000 mg | Freq: Once | INTRAMUSCULAR | Status: AC
  Administered 2023-04-02: 4 mg via INTRAVENOUS
  Filled 2023-04-02: qty 2

## 2023-04-02 MED ORDER — METRONIDAZOLE 500 MG PO TABS: 500.0000 mg | ORAL_TABLET | Freq: Three times a day (TID) | ORAL | 0 refills | Status: AC

## 2023-04-02 MED ORDER — MORPHINE SULFATE (PF) 2 MG/ML IV SOLN
2.0000 mg | Freq: Once | INTRAVENOUS | Status: AC
  Administered 2023-04-02: 2 mg via INTRAVENOUS
  Filled 2023-04-02: qty 1

## 2023-04-02 MED ORDER — SODIUM CHLORIDE 0.9 % IV SOLN
1.0000 g | Freq: Once | INTRAVENOUS | Status: AC
  Administered 2023-04-02: 1 g via INTRAVENOUS
  Filled 2023-04-02: qty 10

## 2023-04-02 MED ORDER — METRONIDAZOLE 500 MG/100ML IV SOLN
500.0000 mg | Freq: Once | INTRAVENOUS | Status: AC
  Administered 2023-04-02: 500 mg via INTRAVENOUS
  Filled 2023-04-02: qty 100

## 2023-04-02 MED ORDER — LACTATED RINGERS IV BOLUS
1000.0000 mL | Freq: Once | INTRAVENOUS | Status: AC
  Administered 2023-04-02: 1000 mL via INTRAVENOUS

## 2023-04-02 MED ORDER — CEFDINIR 300 MG PO CAPS: 300.0000 mg | ORAL_CAPSULE | Freq: Two times a day (BID) | ORAL | 0 refills | Status: DC

## 2023-04-02 NOTE — ED Notes (Signed)
Pt given water per request. Pt denies any further needs. Pt awaiting transportation from [facility].

## 2023-04-02 NOTE — ED Provider Notes (Signed)
[facility] Provider Note    Event Date/Time   First MD Initiated Contact with Patient 04/02/23 [phone]     (approximate)   History   Abdominal Pain   HPI  Dawn [name] is a 83 y.o. female with history of CHF, diabetes, anxiety, fibromyalgia, presenting with left lower quadrant abdominal pain.  States that started today, associated with some nausea vomiting.  States her stools are loose.  Also with some dysuria.  No back pain.  No upper abdominal pain.  Denies any blood in her stool or black stools.  On independent chart review patient had CT abdomen pelvis that was done in 2023 that showed cholelithiasis, sigmoid diverticulosis.  Also has history of ischemic colitis, presented at the time with rectal bleeding.     Physical Exam   Triage Vital Signs: ED Triage Vitals  Encounter Vitals Group     BP 04/01/23 2315 131/64     Systolic BP Percentile --      Diastolic BP Percentile --      Pulse Rate 04/01/23 2315 89     Resp 04/01/23 2315 18     Temp 04/01/23 2315 98.6 F (37 C)     Temp Source 04/01/23 2315 Oral     SpO2 04/01/23 2315 95 %     Weight 04/01/23 2314 200 lb (90.7 kg)     Height 04/01/23 2314 5\' 4"  (1.626 m)     Head Circumference --      Peak Flow --      Pain Score 04/01/23 2314 10     Pain Loc --      Pain Education --      Exclude from Growth Chart --     Most recent vital signs: Vitals:   04/01/23 2315 04/02/23 0319  BP: 131/64 (!) 112/55  Pulse: 89 95  Resp: 18 16  Temp: 98.6 F (37 C) 98.4 F (36.9 C)  SpO2: 95% 96%     General: Awake, no distress.  CV:  Good peripheral perfusion.  Resp:  Normal effort.  Abd:  No distention.  Tender in the left lower quadrant without any rebound or guarding Other:  No CVA tenderness bilaterally   ED Results / Procedures / Treatments   Labs (all labs ordered are listed, but only abnormal results are displayed) Labs Reviewed  COMPREHENSIVE METABOLIC PANEL - Abnormal; Notable  for the following components:      Result Value   Sodium 134 (*)    Glucose, Bld 181 (*)    BUN 25 (*)    Creatinine, Ser 1.03 (*)    Calcium 8.5 (*)    Total Protein 6.4 (*)    AST 166 (*)    ALT 92 (*)    Alkaline Phosphatase 133 (*)    GFR, Estimated 54 (*)    All other components within normal limits  CBC - Abnormal; Notable for the following components:   WBC 11.7 (*)    All other components within normal limits  URINALYSIS, ROUTINE W REFLEX MICROSCOPIC - Abnormal; Notable for the following components:   Color, Urine AMBER (*)    APPearance CLOUDY (*)    Protein, ur 30 (*)    Leukocytes,Ua MODERATE (*)    Bacteria, UA RARE (*)    Non Squamous Epithelial PRESENT (*)    All other components within normal limits  RESP PANEL BY RT-PCR (RSV, FLU A&B, COVID)  RVPGX2  LIPASE, BLOOD  RADIOLOGY CT imaging on my interpretation without obvious free air.   PROCEDURES:  Critical Care performed: No  Procedures   MEDICATIONS ORDERED IN ED: Medications  metroNIDAZOLE (FLAGYL) IVPB 500 mg (500 mg Intravenous New Bag/Given 04/02/23 0450)  lactated ringers bolus 1,000 mL (0 mLs Intravenous Stopped 04/02/23 0418)  ondansetron (ZOFRAN) injection 4 mg (4 mg Intravenous Given 04/02/23 0254)  morphine (PF) 2 MG/ML injection 2 mg (2 mg Intravenous Given 04/02/23 0315)  cefTRIAXone (ROCEPHIN) 1 g in sodium chloride 0.9 % 100 mL IVPB (0 g Intravenous Stopped 04/02/23 0450)     IMPRESSION / MDM / ASSESSMENT AND PLAN / ED COURSE  I reviewed the triage vital signs and the nursing notes.                              Differential diagnosis includes, but is not limited to, diverticulitis, colitis, norovirus, influenza, electrolyte derangements, dehydration, UTI, pyelonephritis.  Get labs, UA, CT abdomen pelvis, fluids, Zofran.  Patient declined CT with IV contrast, understands risk of missing pathology if we did not get contrast.  Patient's presentation is most consistent with acute  presentation with potential threat to life or bodily function.  Independent review of labs and imaging noted below.  Patient was given IV ceftriaxone as well as IV Flagyl.  On reassessment she is awake alert, well-appearing, did not get a reaction to the ceftriaxone.  I believe she is safe for outpatient management.  Will discharge with cefdinir as well as Flagyl for 10 days.  Instructed her to follow-up with her primary care doctor in 2 to 3 days for reassessment.  Also to get repeat labs for her LFTs.  Strict return precautions given.  Discharge.  Clinical Course as of 04/02/23 0541  Thu Apr 02, 2023  0400 Independent review of labs, UA is consistent with UTI, she is mild leukocytosis, lipase is normal, electrolytes not severely deranged, her LFTs are mildly elevated but patient has no jaundice, no right upper quadrant pain.  Lipase is not elevated.  CT did not show any abnormalities with the liver, did show cholelithiasis without cholecystitis.  CT also showed mild colitis.  Will start treatment with some IV antibiotics here, will give her ceftriaxone and she is he is able to tolerate that in the past, as well as IV Flagyl.  If patient remains stable, tolerating antibiotics, I think she is safe for outpatient management, and can be discharged with oral antibiotics.  She is currently tolerating p.o. [TT]  0414 Resp panel by RT-PCR (RSV, Flu A&B, Covid) Anterior Nasal Swab Negative [TT]    Clinical Course User Index [TT] Claybon Jabs, MD     FINAL CLINICAL IMPRESSION(S) / ED DIAGNOSES   Final diagnoses:  Colitis  Urinary tract infection without hematuria, site unspecified  Elevated LFTs     Rx / DC Orders   ED Discharge Orders          Ordered    cefdinir (OMNICEF) 300 MG capsule  2 times daily        04/02/23 0539    metroNIDAZOLE (FLAGYL) 500 MG tablet  3 times daily        04/02/23 0539             Note:  This document was prepared using Dragon voice recognition software  and may include unintentional dictation errors.    Claybon Jabs, MD 04/02/23 [phone]

## 2023-04-02 NOTE — Discharge Instructions (Addendum)
Please take the antibiotics as prescribed for your colitis as well as UTI.  Please follow-up with your primary care doctor in 2 to 3 days for reassessment to make sure that you are not getting worse.  Please also follow-up with your primary care doctor to get repeat labs done since your liver function labs were also elevated.

## 2023-04-02 NOTE — ED Notes (Signed)
Medical Supervisor from [address] assisted [facility] called me. Stated she was worried that the pt was no longer here. I reassured her that the pt is still here in her rooming waiting for transportation. The medical supervisor mentioned that transportation will not be able to pick her up until 0800-0900.

## 2023-04-02 NOTE — ED Notes (Signed)
Report called to Ivonne at [facility]. Ellwood Handler said she will arrange transportation for this patient. She also mentioned transportation will not be able to make it out to the hospital until around 0800.

## 2023-04-07 ENCOUNTER — Emergency Department: Payer: Medicare HMO

## 2023-04-07 ENCOUNTER — Other Ambulatory Visit: Payer: Self-pay

## 2023-04-07 ENCOUNTER — Emergency Department
Admission: EM | Admit: 2023-04-07 | Discharge: 2023-04-07 | Disposition: A | Discharge status: Home / Self Care | Payer: Medicare HMO | Attending: Emergency Medicine | Admitting: Emergency Medicine

## 2023-04-07 DIAGNOSIS — I509 Heart failure, unspecified: Secondary | ICD-10-CM | POA: Diagnosis not present

## 2023-04-07 DIAGNOSIS — K5730 Diverticulosis of large intestine without perforation or abscess without bleeding: Secondary | ICD-10-CM | POA: Insufficient documentation

## 2023-04-07 DIAGNOSIS — D259 Leiomyoma of uterus, unspecified: Secondary | ICD-10-CM | POA: Diagnosis not present

## 2023-04-07 DIAGNOSIS — K8020 Calculus of gallbladder without cholecystitis without obstruction: Secondary | ICD-10-CM | POA: Insufficient documentation

## 2023-04-07 DIAGNOSIS — M545 Low back pain, unspecified: Secondary | ICD-10-CM | POA: Diagnosis not present

## 2023-04-07 DIAGNOSIS — I10 Essential (primary) hypertension: Secondary | ICD-10-CM

## 2023-04-07 DIAGNOSIS — N200 Calculus of kidney: Secondary | ICD-10-CM | POA: Insufficient documentation

## 2023-04-07 DIAGNOSIS — I700 Atherosclerosis of aorta: Secondary | ICD-10-CM | POA: Insufficient documentation

## 2023-04-07 DIAGNOSIS — I110 Hypertensive heart disease with heart failure: Secondary | ICD-10-CM | POA: Diagnosis present

## 2023-04-07 DIAGNOSIS — I2510 Atherosclerotic heart disease of native coronary artery without angina pectoris: Secondary | ICD-10-CM | POA: Diagnosis not present

## 2023-04-07 DIAGNOSIS — Z743 Need for continuous supervision: Secondary | ICD-10-CM | POA: Diagnosis not present

## 2023-04-07 LAB — COMPREHENSIVE METABOLIC PANEL
ALT: 101 U/L — ABNORMAL HIGH (ref 0–44)
AST: 53 U/L — ABNORMAL HIGH (ref 15–41)
Albumin: 3.5 g/dL (ref 3.5–5.0)
Alkaline Phosphatase: 178 U/L — ABNORMAL HIGH (ref 38–126)
Anion gap: 9 (ref 5–15)
BUN: 19 mg/dL (ref 8–23)
CO2: 25 mmol/L (ref 22–32)
Calcium: 8.9 mg/dL (ref 8.9–10.3)
Chloride: 101 mmol/L (ref 98–111)
Creatinine, Ser: 0.76 mg/dL (ref 0.44–1.00)
GFR, Estimated: >60 mL/min (ref >60)
Glucose, Bld: 159 mg/dL — ABNORMAL HIGH (ref 70–99)
Potassium: 4.1 mmol/L (ref 3.5–5.1)
Sodium: 135 mmol/L (ref 135–145)
Total Bilirubin: 1.2 mg/dL (ref 0.0–1.2)
Total Protein: 6.8 g/dL (ref 6.5–8.1)

## 2023-04-07 LAB — URINALYSIS, ROUTINE W REFLEX MICROSCOPIC
Bilirubin Urine: NEGATIVE
Glucose, UA: 50 mg/dL — AB
Hgb urine dipstick: NEGATIVE
Ketones, ur: 5 mg/dL — AB
Nitrite: NEGATIVE
Protein, ur: NEGATIVE mg/dL
Specific Gravity, Urine: 1.023 (ref 1.005–1.030)
pH: 5 (ref 5.0–8.0)

## 2023-04-07 LAB — CBC
HCT: 40 % (ref 36.0–46.0)
Hemoglobin: 13.2 g/dL (ref 12.0–15.0)
MCH: 30.5 pg (ref 26.0–34.0)
MCHC: 33 g/dL (ref 30.0–36.0)
MCV: 92.4 fL (ref 80.0–100.0)
Platelets: 294 10*3/uL (ref 150–400)
RBC: 4.33 MIL/uL (ref 3.87–5.11)
RDW: 13.5 % (ref 11.5–15.5)
WBC: 10.7 10*3/uL — ABNORMAL HIGH (ref 4.0–10.5)
nRBC: 0 % (ref 0.0–0.2)

## 2023-04-07 MED ORDER — LOSARTAN POTASSIUM 50 MG PO TABS: 50.0000 mg | ORAL_TABLET | Freq: Every day | ORAL | 11 refills | Status: DC

## 2023-04-07 MED ORDER — LOSARTAN POTASSIUM 50 MG PO TABS
50.0000 mg | ORAL_TABLET | Freq: Once | ORAL | Status: AC
  Administered 2023-04-07: 50 mg via ORAL
  Filled 2023-04-07: qty 1

## 2023-04-07 MED ORDER — LIDOCAINE 5 % EX PTCH
1.0000 | MEDICATED_PATCH | Freq: Every day | CUTANEOUS | Status: DC | PRN
  Administered 2023-04-07: 1 via TRANSDERMAL
  Filled 2023-04-07: qty 1

## 2023-04-07 MED ORDER — MELOXICAM 15 MG PO TABS: 15.0000 mg | ORAL_TABLET | Freq: Every day | ORAL | 0 refills | Status: AC

## 2023-04-07 NOTE — ED Triage Notes (Signed)
 Pt here with a UTI and hypertension. Pt was on a abx that was not helping. Pt also having back pain.

## 2023-04-07 NOTE — ED Triage Notes (Signed)
 First nurse note: pt to ED [facility] from [facility] for continued UTI. Has been on antibiotics for 5 days. oriented

## 2023-04-07 NOTE — ED Provider Triage Note (Signed)
 Emergency Medicine Provider Triage Evaluation Note  Dawn [name] , a 83 y.o. female  was evaluated in triage.  Pt complains of nausea, kidney pain, hx recent uti is currently on antibiotic x 5 days.  Review of Systems  Positive:  Negative:   Physical Exam  BP (!) 158/81 (BP Location: Right Arm)   Pulse 72   Temp 98.5 F (36.9 C) (Oral)   Resp 18   SpO2 98%  Gen:   Awake, no distress   Resp:  Normal effort  MSK:   Moves extremities without difficulty  Other:    Medical Decision Making  Medically screening exam initiated at 12:00 PM.  Appropriate orders placed.  Dawn [name] was informed that the remainder of the evaluation will be completed by another provider, this initial triage assessment does not replace that evaluation, and the importance of remaining in the ED until their evaluation is complete.     Faythe Ghee, PA-C 04/07/23 1201

## 2023-04-07 NOTE — ED Provider Notes (Signed)
 [facility] Provider Note   Event Date/Time   First MD Initiated Contact with Patient 04/07/23 1623     (approximate) History  Hypertension and Urinary Tract Infection  HPI Dawn [name] is a 83 y.o. female with a past medical history of CHF, rheumatoid arthritis, PTSD, and hypertension not currently taking medications who presents complaining of hypertension and bilateral lumbar back pain.  Patient states that she is had this lumbar back pain intermittently over the last few years but is concerned that may be urinary tract infection.  Patient also states that she has never been treated for hypertension in the past despite her history of CHF. ROS: Patient currently denies any vision changes, tinnitus, difficulty speaking, facial droop, sore throat, chest pain, shortness of breath, abdominal pain, nausea/vomiting/diarrhea, dysuria, or weakness/numbness/paresthesias in any extremity   Physical Exam  Triage Vital Signs: ED Triage Vitals  Encounter Vitals Group     BP 04/07/23 1144 (!) 158/81     Systolic BP Percentile --      Diastolic BP Percentile --      Pulse Rate 04/07/23 1144 72     Resp 04/07/23 1144 18     Temp 04/07/23 1144 98.5 F (36.9 C)     Temp Source 04/07/23 1144 Oral     SpO2 04/07/23 1144 98 %     Weight --      Height --      Head Circumference --      Peak Flow --      Pain Score 04/07/23 1154 8     Pain Loc --      Pain Education --      Exclude from Growth Chart --    Most recent vital signs: Vitals:   04/07/23 1630 04/07/23 1700  BP: (!) 183/87 (!) 180/83  Pulse: 81 78  Resp:  18  Temp:    SpO2: 97% 95%   General: Awake, oriented x4. CV:  Good peripheral perfusion.  Resp:  Normal effort.  Abd:  No distention.  Other:  Elderly obese Caucasian female resting comfortably in no acute distress.  Tenderness palpation of bilateral lumbar paraspinal musculature ED Results / Procedures / Treatments  Labs (all labs ordered are  listed, but only abnormal results are displayed) Labs Reviewed  CBC - Abnormal; Notable for the following components:      Result Value   WBC 10.7 (*)    All other components within normal limits  COMPREHENSIVE METABOLIC PANEL - Abnormal; Notable for the following components:   Glucose, Bld 159 (*)    AST 53 (*)    ALT 101 (*)    Alkaline Phosphatase 178 (*)    All other components within normal limits  URINALYSIS, ROUTINE W REFLEX MICROSCOPIC - Abnormal; Notable for the following components:   Color, Urine AMBER (*)    APPearance HAZY (*)    Glucose, UA 50 (*)    Ketones, ur 5 (*)    Leukocytes,Ua TRACE (*)    Bacteria, UA RARE (*)    All other components within normal limits   EKG ED ECG REPORT I, Merwyn Katos, the attending physician, personally viewed and interpreted this ECG. Date: 04/07/2023 EKG Time: 1158 Rate: 71 Rhythm: normal sinus rhythm QRS Axis: normal Intervals: normal ST/T Wave abnormalities: normal Narrative Interpretation: no evidence of acute ischemia RADIOLOGY ED MD interpretation: CT renal stone study shows punctate nonobstructive bilateral renal calculi.  There is also cholelithiasis without evidence of acute  cholecystitis -Agree with radiology assessment Official radiology report(s): CT Renal Stone Study Result Date: 04/07/2023 CLINICAL DATA:  And flank pain, recent UTI EXAM: CT ABDOMEN AND PELVIS WITHOUT CONTRAST TECHNIQUE: Multidetector CT imaging of the abdomen and pelvis was performed following the standard protocol without IV contrast. RADIATION DOSE REDUCTION: This exam was performed according to the departmental dose-optimization program which includes automated exposure control, adjustment of the mA and/or kV according to patient size and/or use of iterative reconstruction technique. COMPARISON:  04/02/2023 FINDINGS: Lower chest: No acute abnormality. Coronary artery calcifications. Small hiatal hernia. Hepatobiliary: No solid liver abnormality is  seen. Tiny gallstones and gravel in the dependent gallbladder (series 2, image 37). No gallbladder wall thickening, or biliary dilatation. Pancreas: Unremarkable. No pancreatic ductal dilatation or surrounding inflammatory changes. Spleen: Normal in size without significant abnormality. Adrenals/Urinary Tract: Adrenal glands are unremarkable. Punctuate nonobstructive bilateral renal calculi (series 5, image 55). No ureteral calculi or hydronephrosis. Multiple bilateral parapelvic renal cysts, benign, requiring no specific further follow-up or characterization. Bladder is unremarkable. Stomach/Bowel: Stomach is within normal limits. Appendix appears normal. No evidence of bowel wall thickening, distention, or inflammatory changes. Sigmoid diverticula. Vascular/Lymphatic: Aortic atherosclerosis. No enlarged abdominal or pelvic lymph nodes. Reproductive: Calcified fibroids. Other: No abdominal wall hernia or abnormality. No ascites. Musculoskeletal: No acute or significant osseous findings. IMPRESSION: 1. Punctuate nonobstructive bilateral renal calculi. No ureteral calculi or hydronephrosis. 2. Cholelithiasis without evidence of acute cholecystitis. 3. Sigmoid diverticulosis without evidence of acute diverticulitis. 4. Calcified uterine fibroids. 5. Coronary artery disease. Aortic Atherosclerosis (ICD10-I70.0). Electronically Signed   By: Jearld Lesch M.D.   On: 04/07/2023 15:06   PROCEDURES: Critical Care performed: No .1-3 Lead EKG Interpretation  Performed by: Merwyn Katos, MD Authorized by: Merwyn Katos, MD     Interpretation: normal     ECG rate:  71   ECG rate assessment: normal     Rhythm: sinus rhythm     Ectopy: none     Conduction: normal    MEDICATIONS ORDERED IN ED: Medications  lidocaine (LIDODERM) 5 % 1 patch (has no administration in time range)  losartan (COZAAR) tablet 50 mg (50 mg Oral Given 04/07/23 1641)   IMPRESSION / MDM / ASSESSMENT AND PLAN / ED COURSE  I reviewed the  triage vital signs and the nursing notes.                             The patient is on the cardiac monitor to evaluate for evidence of arrhythmia and/or significant heart rate changes. Patient's presentation is most consistent with acute presentation with potential threat to life or bodily function. Presents to the emergency department complaining of high blood pressure. Patient is otherwise asymptomatic without confusion, chest pain, hematuria, or SOB. Denies nonadherence to antihypertensive regimen DDx: CV, AMI, heart failure, renal infarction or failure or other end organ damage.  Disposition: Discussed with patient their elevated blood pressure and need for close outpatient management of their hypertension. Will provide a prescription for losartan 50 mg PO daily and arrange for the patient to follow up in a primary care clinic   FINAL CLINICAL IMPRESSION(S) / ED DIAGNOSES   Final diagnoses:  Uncontrolled hypertension  Lumbar back pain   Rx / DC Orders   ED Discharge Orders          Ordered    losartan (COZAAR) 50 MG tablet  Daily        04/07/23  1658    meloxicam (MOBIC) 15 MG tablet  Daily        04/07/23 1658           Note:  This document was prepared using Dragon voice recognition software and may include unintentional dictation errors.   Merwyn Katos, MD 04/07/23 [phone]

## 2023-04-13 DIAGNOSIS — I10 Essential (primary) hypertension: Secondary | ICD-10-CM | POA: Diagnosis not present

## 2023-04-13 DIAGNOSIS — I70223 Atherosclerosis of native arteries of extremities with rest pain, bilateral legs: Secondary | ICD-10-CM | POA: Diagnosis not present

## 2023-04-13 DIAGNOSIS — E559 Vitamin D deficiency, unspecified: Secondary | ICD-10-CM | POA: Diagnosis not present

## 2023-04-13 DIAGNOSIS — E038 Other specified hypothyroidism: Secondary | ICD-10-CM | POA: Diagnosis not present

## 2023-04-13 DIAGNOSIS — D519 Vitamin B12 deficiency anemia, unspecified: Secondary | ICD-10-CM | POA: Diagnosis not present

## 2023-04-13 DIAGNOSIS — E782 Mixed hyperlipidemia: Secondary | ICD-10-CM | POA: Diagnosis not present

## 2023-04-13 DIAGNOSIS — E119 Type 2 diabetes mellitus without complications: Secondary | ICD-10-CM | POA: Diagnosis not present

## 2023-04-17 DIAGNOSIS — J449 Chronic obstructive pulmonary disease, unspecified: Secondary | ICD-10-CM | POA: Diagnosis not present

## 2023-04-20 DIAGNOSIS — Z79899 Other long term (current) drug therapy: Secondary | ICD-10-CM | POA: Diagnosis not present

## 2023-04-20 DIAGNOSIS — E782 Mixed hyperlipidemia: Secondary | ICD-10-CM | POA: Diagnosis not present

## 2023-04-20 DIAGNOSIS — E119 Type 2 diabetes mellitus without complications: Secondary | ICD-10-CM | POA: Diagnosis not present

## 2023-04-20 DIAGNOSIS — D519 Vitamin B12 deficiency anemia, unspecified: Secondary | ICD-10-CM | POA: Diagnosis not present

## 2023-04-30 DIAGNOSIS — F419 Anxiety disorder, unspecified: Secondary | ICD-10-CM | POA: Diagnosis not present

## 2023-04-30 DIAGNOSIS — F4310 Post-traumatic stress disorder, unspecified: Secondary | ICD-10-CM | POA: Diagnosis not present

## 2023-04-30 DIAGNOSIS — F32A Depression, unspecified: Secondary | ICD-10-CM | POA: Diagnosis not present

## 2023-04-30 DIAGNOSIS — F03B18 Unspecified dementia, moderate, with other behavioral disturbance: Secondary | ICD-10-CM | POA: Diagnosis not present

## 2023-05-03 DIAGNOSIS — R109 Unspecified abdominal pain: Secondary | ICD-10-CM | POA: Diagnosis not present

## 2023-05-04 DIAGNOSIS — E038 Other specified hypothyroidism: Secondary | ICD-10-CM | POA: Diagnosis not present

## 2023-05-04 DIAGNOSIS — Z79899 Other long term (current) drug therapy: Secondary | ICD-10-CM | POA: Diagnosis not present

## 2023-05-04 DIAGNOSIS — I10 Essential (primary) hypertension: Secondary | ICD-10-CM | POA: Diagnosis not present

## 2023-05-04 DIAGNOSIS — I70223 Atherosclerosis of native arteries of extremities with rest pain, bilateral legs: Secondary | ICD-10-CM | POA: Diagnosis not present

## 2023-05-04 DIAGNOSIS — E782 Mixed hyperlipidemia: Secondary | ICD-10-CM | POA: Diagnosis not present

## 2023-05-04 DIAGNOSIS — D519 Vitamin B12 deficiency anemia, unspecified: Secondary | ICD-10-CM | POA: Diagnosis not present

## 2023-05-04 DIAGNOSIS — E559 Vitamin D deficiency, unspecified: Secondary | ICD-10-CM | POA: Diagnosis not present

## 2023-05-04 DIAGNOSIS — E119 Type 2 diabetes mellitus without complications: Secondary | ICD-10-CM | POA: Diagnosis not present

## 2023-05-05 DIAGNOSIS — F419 Anxiety disorder, unspecified: Secondary | ICD-10-CM | POA: Diagnosis not present

## 2023-05-05 DIAGNOSIS — K219 Gastro-esophageal reflux disease without esophagitis: Secondary | ICD-10-CM | POA: Diagnosis not present

## 2023-05-05 DIAGNOSIS — L409 Psoriasis, unspecified: Secondary | ICD-10-CM | POA: Diagnosis not present

## 2023-05-05 DIAGNOSIS — G8929 Other chronic pain: Secondary | ICD-10-CM | POA: Diagnosis not present

## 2023-05-05 DIAGNOSIS — F22 Delusional disorders: Secondary | ICD-10-CM | POA: Diagnosis not present

## 2023-05-05 DIAGNOSIS — R7989 Other specified abnormal findings of blood chemistry: Secondary | ICD-10-CM | POA: Diagnosis not present

## 2023-05-05 DIAGNOSIS — K760 Fatty (change of) liver, not elsewhere classified: Secondary | ICD-10-CM | POA: Diagnosis not present

## 2023-05-05 DIAGNOSIS — N390 Urinary tract infection, site not specified: Secondary | ICD-10-CM | POA: Diagnosis not present

## 2023-05-05 DIAGNOSIS — R10811 Right upper quadrant abdominal tenderness: Secondary | ICD-10-CM | POA: Diagnosis not present

## 2023-05-05 DIAGNOSIS — E119 Type 2 diabetes mellitus without complications: Secondary | ICD-10-CM | POA: Diagnosis not present

## 2023-05-05 DIAGNOSIS — R1310 Dysphagia, unspecified: Secondary | ICD-10-CM | POA: Diagnosis not present

## 2023-05-09 ENCOUNTER — Emergency Department

## 2023-05-09 ENCOUNTER — Other Ambulatory Visit: Payer: Self-pay

## 2023-05-09 ENCOUNTER — Inpatient Hospital Stay
Admission: EM | Admit: 2023-05-09 | Discharge: 2023-05-18 | DRG: 854 | Disposition: A | Discharge status: Home Health | Attending: Internal Medicine | Admitting: Internal Medicine

## 2023-05-09 DIAGNOSIS — K8309 Other cholangitis: Secondary | ICD-10-CM | POA: Diagnosis present

## 2023-05-09 DIAGNOSIS — F0394 Unspecified dementia, unspecified severity, with anxiety: Secondary | ICD-10-CM | POA: Diagnosis present

## 2023-05-09 DIAGNOSIS — Z881 Allergy status to other antibiotic agents status: Secondary | ICD-10-CM

## 2023-05-09 DIAGNOSIS — J449 Chronic obstructive pulmonary disease, unspecified: Secondary | ICD-10-CM | POA: Diagnosis present

## 2023-05-09 DIAGNOSIS — E872 Acidosis, unspecified: Secondary | ICD-10-CM | POA: Diagnosis present

## 2023-05-09 DIAGNOSIS — D649 Anemia, unspecified: Secondary | ICD-10-CM | POA: Diagnosis present

## 2023-05-09 DIAGNOSIS — F0392 Unspecified dementia, unspecified severity, with psychotic disturbance: Secondary | ICD-10-CM | POA: Diagnosis present

## 2023-05-09 DIAGNOSIS — Z794 Long term (current) use of insulin: Secondary | ICD-10-CM

## 2023-05-09 DIAGNOSIS — Z882 Allergy status to sulfonamides status: Secondary | ICD-10-CM

## 2023-05-09 DIAGNOSIS — R112 Nausea with vomiting, unspecified: Secondary | ICD-10-CM | POA: Diagnosis not present

## 2023-05-09 DIAGNOSIS — A419 Sepsis, unspecified organism: Secondary | ICD-10-CM | POA: Insufficient documentation

## 2023-05-09 DIAGNOSIS — R1011 Right upper quadrant pain: Principal | ICD-10-CM

## 2023-05-09 DIAGNOSIS — R932 Abnormal findings on diagnostic imaging of liver and biliary tract: Secondary | ICD-10-CM | POA: Diagnosis not present

## 2023-05-09 DIAGNOSIS — F4322 Adjustment disorder with anxiety: Secondary | ICD-10-CM | POA: Diagnosis present

## 2023-05-09 DIAGNOSIS — I10 Essential (primary) hypertension: Secondary | ICD-10-CM | POA: Diagnosis not present

## 2023-05-09 DIAGNOSIS — Z1152 Encounter for screening for COVID-19: Secondary | ICD-10-CM

## 2023-05-09 DIAGNOSIS — F22 Delusional disorders: Secondary | ICD-10-CM | POA: Diagnosis present

## 2023-05-09 DIAGNOSIS — K449 Diaphragmatic hernia without obstruction or gangrene: Secondary | ICD-10-CM | POA: Diagnosis not present

## 2023-05-09 DIAGNOSIS — K7689 Other specified diseases of liver: Secondary | ICD-10-CM | POA: Diagnosis not present

## 2023-05-09 DIAGNOSIS — Z91410 Personal history of adult physical and sexual abuse: Secondary | ICD-10-CM

## 2023-05-09 DIAGNOSIS — R262 Difficulty in walking, not elsewhere classified: Secondary | ICD-10-CM

## 2023-05-09 DIAGNOSIS — F410 Panic disorder [episodic paroxysmal anxiety] without agoraphobia: Secondary | ICD-10-CM | POA: Diagnosis present

## 2023-05-09 DIAGNOSIS — K8060 Calculus of gallbladder and bile duct with cholecystitis, unspecified, without obstruction: Secondary | ICD-10-CM | POA: Diagnosis present

## 2023-05-09 DIAGNOSIS — Z820 Family history of epilepsy and other diseases of the nervous system: Secondary | ICD-10-CM

## 2023-05-09 DIAGNOSIS — R509 Fever, unspecified: Secondary | ICD-10-CM | POA: Diagnosis not present

## 2023-05-09 DIAGNOSIS — E785 Hyperlipidemia, unspecified: Secondary | ICD-10-CM | POA: Diagnosis present

## 2023-05-09 DIAGNOSIS — N281 Cyst of kidney, acquired: Secondary | ICD-10-CM | POA: Diagnosis not present

## 2023-05-09 DIAGNOSIS — K8070 Calculus of gallbladder and bile duct without cholecystitis without obstruction: Secondary | ICD-10-CM

## 2023-05-09 DIAGNOSIS — M069 Rheumatoid arthritis, unspecified: Secondary | ICD-10-CM | POA: Diagnosis present

## 2023-05-09 DIAGNOSIS — I110 Hypertensive heart disease with heart failure: Secondary | ICD-10-CM | POA: Diagnosis present

## 2023-05-09 DIAGNOSIS — F4310 Post-traumatic stress disorder, unspecified: Secondary | ICD-10-CM | POA: Diagnosis present

## 2023-05-09 DIAGNOSIS — K828 Other specified diseases of gallbladder: Secondary | ICD-10-CM | POA: Diagnosis not present

## 2023-05-09 DIAGNOSIS — Z1612 Extended spectrum beta lactamase (ESBL) resistance: Secondary | ICD-10-CM | POA: Diagnosis present

## 2023-05-09 DIAGNOSIS — Z62810 Personal history of physical and sexual abuse in childhood: Secondary | ICD-10-CM

## 2023-05-09 DIAGNOSIS — E1140 Type 2 diabetes mellitus with diabetic neuropathy, unspecified: Secondary | ICD-10-CM | POA: Diagnosis present

## 2023-05-09 DIAGNOSIS — K8020 Calculus of gallbladder without cholecystitis without obstruction: Secondary | ICD-10-CM | POA: Diagnosis not present

## 2023-05-09 DIAGNOSIS — A4151 Sepsis due to Escherichia coli [E. coli]: Secondary | ICD-10-CM | POA: Diagnosis not present

## 2023-05-09 DIAGNOSIS — J40 Bronchitis, not specified as acute or chronic: Secondary | ICD-10-CM

## 2023-05-09 DIAGNOSIS — F0393 Unspecified dementia, unspecified severity, with mood disturbance: Secondary | ICD-10-CM | POA: Diagnosis present

## 2023-05-09 DIAGNOSIS — E66811 Obesity, class 1: Secondary | ICD-10-CM | POA: Diagnosis present

## 2023-05-09 DIAGNOSIS — Z6834 Body mass index (BMI) 34.0-34.9, adult: Secondary | ICD-10-CM

## 2023-05-09 DIAGNOSIS — Z7984 Long term (current) use of oral hypoglycemic drugs: Secondary | ICD-10-CM

## 2023-05-09 DIAGNOSIS — K429 Umbilical hernia without obstruction or gangrene: Secondary | ICD-10-CM | POA: Diagnosis present

## 2023-05-09 DIAGNOSIS — I509 Heart failure, unspecified: Secondary | ICD-10-CM | POA: Diagnosis present

## 2023-05-09 DIAGNOSIS — R7881 Bacteremia: Secondary | ICD-10-CM | POA: Diagnosis present

## 2023-05-09 DIAGNOSIS — Z886 Allergy status to analgesic agent status: Secondary | ICD-10-CM

## 2023-05-09 DIAGNOSIS — B9620 Unspecified Escherichia coli [E. coli] as the cause of diseases classified elsewhere: Secondary | ICD-10-CM | POA: Diagnosis present

## 2023-05-09 DIAGNOSIS — M797 Fibromyalgia: Secondary | ICD-10-CM | POA: Diagnosis present

## 2023-05-09 DIAGNOSIS — I2510 Atherosclerotic heart disease of native coronary artery without angina pectoris: Secondary | ICD-10-CM | POA: Diagnosis present

## 2023-05-09 DIAGNOSIS — R109 Unspecified abdominal pain: Secondary | ICD-10-CM | POA: Diagnosis not present

## 2023-05-09 DIAGNOSIS — Z880 Allergy status to penicillin: Secondary | ICD-10-CM

## 2023-05-09 DIAGNOSIS — R000 Tachycardia, unspecified: Secondary | ICD-10-CM | POA: Diagnosis not present

## 2023-05-09 DIAGNOSIS — R6520 Severe sepsis without septic shock: Secondary | ICD-10-CM | POA: Diagnosis present

## 2023-05-09 DIAGNOSIS — F32A Depression, unspecified: Secondary | ICD-10-CM | POA: Diagnosis present

## 2023-05-09 DIAGNOSIS — F03918 Unspecified dementia, unspecified severity, with other behavioral disturbance: Secondary | ICD-10-CM | POA: Diagnosis present

## 2023-05-09 LAB — COMPREHENSIVE METABOLIC PANEL
ALT: 204 U/L — ABNORMAL HIGH (ref 0–44)
AST: 352 U/L — ABNORMAL HIGH (ref 15–41)
Albumin: 3.7 g/dL (ref 3.5–5.0)
Alkaline Phosphatase: 189 U/L — ABNORMAL HIGH (ref 38–126)
Anion gap: 6 (ref 5–15)
BUN: 19 mg/dL (ref 8–23)
CO2: 26 mmol/L (ref 22–32)
Calcium: 8.1 mg/dL — ABNORMAL LOW (ref 8.9–10.3)
Chloride: 103 mmol/L (ref 98–111)
Creatinine, Ser: 0.77 mg/dL (ref 0.44–1.00)
GFR, Estimated: >60 mL/min (ref >60)
Glucose, Bld: 170 mg/dL — ABNORMAL HIGH (ref 70–99)
Potassium: 3.8 mmol/L (ref 3.5–5.1)
Sodium: 135 mmol/L (ref 135–145)
Total Bilirubin: 1.9 mg/dL — ABNORMAL HIGH (ref 0.0–1.2)
Total Protein: 6.7 g/dL (ref 6.5–8.1)

## 2023-05-09 LAB — CBC
HCT: 41.7 % (ref 36.0–46.0)
Hemoglobin: 13.7 g/dL (ref 12.0–15.0)
MCH: 32 pg (ref 26.0–34.0)
MCHC: 32.9 g/dL (ref 30.0–36.0)
MCV: 97.4 fL (ref 80.0–100.0)
Platelets: 225 10*3/uL (ref 150–400)
RBC: 4.28 MIL/uL (ref 3.87–5.11)
RDW: 12.7 % (ref 11.5–15.5)
WBC: 17.6 10*3/uL — ABNORMAL HIGH (ref 4.0–10.5)
nRBC: 0 % (ref 0.0–0.2)

## 2023-05-09 LAB — URINALYSIS, ROUTINE W REFLEX MICROSCOPIC
Bilirubin Urine: NEGATIVE
Glucose, UA: NEGATIVE mg/dL
Hgb urine dipstick: NEGATIVE
Ketones, ur: NEGATIVE mg/dL
Leukocytes,Ua: NEGATIVE
Nitrite: NEGATIVE
Protein, ur: NEGATIVE mg/dL
Specific Gravity, Urine: 1.017 (ref 1.005–1.030)
pH: 6 (ref 5.0–8.0)

## 2023-05-09 LAB — RESP PANEL BY RT-PCR (RSV, FLU A&B, COVID)  RVPGX2
Influenza A by PCR: NEGATIVE
Influenza B by PCR: NEGATIVE
Resp Syncytial Virus by PCR: NEGATIVE
SARS Coronavirus 2 by RT PCR: NEGATIVE

## 2023-05-09 LAB — LIPASE, BLOOD: Lipase: 21 U/L (ref 11–51)

## 2023-05-09 LAB — LACTIC ACID, PLASMA: Lactic Acid, Venous: 2.8 mmol/L (ref 0.5–1.9)

## 2023-05-09 MED ORDER — IOHEXOL 300 MG/ML  SOLN
100.0000 mL | Freq: Once | INTRAMUSCULAR | Status: AC | PRN
  Administered 2023-05-09: 100 mL via INTRAVENOUS

## 2023-05-09 MED ORDER — SODIUM CHLORIDE 0.9 % IV SOLN
2.0000 g | Freq: Once | INTRAVENOUS | Status: AC
  Administered 2023-05-09: 2 g via INTRAVENOUS
  Filled 2023-05-09: qty 20

## 2023-05-09 MED ORDER — METRONIDAZOLE 500 MG/100ML IV SOLN
500.0000 mg | Freq: Once | INTRAVENOUS | Status: AC
  Administered 2023-05-09: 500 mg via INTRAVENOUS
  Filled 2023-05-09: qty 100

## 2023-05-09 MED ORDER — ACETAMINOPHEN 10 MG/ML IV SOLN
1000.0000 mg | Freq: Four times a day (QID) | INTRAVENOUS | Status: DC
  Filled 2023-05-09 (×2): qty 100

## 2023-05-09 MED ORDER — ONDANSETRON HCL 4 MG/2ML IJ SOLN
4.0000 mg | INTRAMUSCULAR | Status: AC
  Administered 2023-05-09: 4 mg via INTRAVENOUS
  Filled 2023-05-09: qty 2

## 2023-05-09 MED ORDER — SODIUM CHLORIDE 0.9 % IV BOLUS
500.0000 mL | Freq: Once | INTRAVENOUS | Status: AC
  Administered 2023-05-09: 500 mL via INTRAVENOUS

## 2023-05-09 MED ORDER — MORPHINE SULFATE (PF) 2 MG/ML IV SOLN
2.0000 mg | Freq: Once | INTRAVENOUS | Status: AC
  Administered 2023-05-09: 2 mg via INTRAVENOUS
  Filled 2023-05-09: qty 1

## 2023-05-09 NOTE — ED Notes (Signed)
 [place] at the bedside

## 2023-05-09 NOTE — ED Triage Notes (Signed)
 Pt to ED via [facility] from [facility]. Pt reports N/V, joint pain and body aches since yesterday. Pt denies CP, SOB, , adb pain, diarrhea.

## 2023-05-09 NOTE — ED Provider Notes (Signed)
 [facility] Provider Note    Event Date/Time   First MD Initiated Contact with Patient 05/09/23 1703     (approximate)   History   Emesis   HPI  Dawn [name] is a 83 y.o. female  CHF, rheumatoid arthritis, PTSD, and hypertension      Patient has been experiencing 2 days of nausea vomiting and achiness under her right ribs in the right upper abdomen.  No chest pain or cough.  Slight runny nose but she reports that it has been "allergies" which she suffers from  No diarrhea.  She had a fever of 101 earlier.  No pain or burning with urination  Physical Exam   Triage Vital Signs: ED Triage Vitals [05/09/23 1438]  Encounter Vitals Group     BP 128/68     Systolic BP Percentile      Diastolic BP Percentile      Pulse Rate (!) 111     Resp 20     Temp (!) 97.4 F (36.3 C)     Temp Source Oral     SpO2 98 %     Weight      Height      Head Circumference      Peak Flow      Pain Score 8     Pain Loc      Pain Education      Exclude from Growth Chart     Most recent vital signs: Vitals:   05/09/23 1900 05/09/23 2230  BP: 119/61 131/62  Pulse: 95 85  Resp: 19 15  Temp: 98 F (36.7 C)   SpO2: 94% 93%     General: Awake, no distress.  CV:  Good peripheral perfusion.  Slightly mildly ill-appearing normal heart rate. Resp:  Normal effort.  Clear bilateral with normal work of breathing Abd:  No distention.  Reports some mild reproducible tenderness in the right mid to right upper quadrant.  No rebound or guarding.  Equivocal Murphy.  Some discomfort noted along the right mid abdomen as well.  No distention Other:  Warm and well-perfused.  Fully alert and oriented   ED Results / Procedures / Treatments   Labs (all labs ordered are listed, but only abnormal results are displayed) Labs Reviewed  COMPREHENSIVE METABOLIC PANEL - Abnormal; Notable for the following components:      Result Value   Glucose, Bld 170 (*)    Calcium 8.1 (*)     AST 352 (*)    ALT 204 (*)    Alkaline Phosphatase 189 (*)    Total Bilirubin 1.9 (*)    All other components within normal limits  CBC - Abnormal; Notable for the following components:   WBC 17.6 (*)    All other components within normal limits  URINALYSIS, ROUTINE W REFLEX MICROSCOPIC - Abnormal; Notable for the following components:   Color, Urine YELLOW (*)    APPearance CLEAR (*)    All other components within normal limits  LACTIC ACID, PLASMA - Abnormal; Notable for the following components:   Lactic Acid, Venous 2.8 (*)    All other components within normal limits  RESP PANEL BY RT-PCR (RSV, FLU A&B, COVID)  [id number]  CULTURE, BLOOD (ROUTINE X 2)  CULTURE, BLOOD (ROUTINE X 2)  LIPASE, BLOOD  LACTIC ACID, PLASMA   Urinalysis normal.  Negative flu and COVID testing.  Notable leukocytosis.  Transaminitis with elevated bilirubin, has had mildly elevated transaminases in the past  but increasingly so today.     RADIOLOGY  CT ABDOMEN PELVIS W CONTRAST Result Date: 05/09/2023 CLINICAL DATA:  Right-sided abdominal pain. Nausea and vomiting. Cholelithiasis. EXAM: CT ABDOMEN AND PELVIS WITH CONTRAST TECHNIQUE: Multidetector CT imaging of the abdomen and pelvis was performed using the standard protocol following bolus administration of intravenous contrast. RADIATION DOSE REDUCTION: This exam was performed according to the departmental dose-optimization program which includes automated exposure control, adjustment of the mA and/or kV according to patient size and/or use of iterative reconstruction technique. CONTRAST:  OMNIPAQUE IOHEXOL 300 MG/ML  SOLN COMPARISON:  Ultrasound 05/09/2023 and CT scan 04/07/2023 FINDINGS: Lower chest: Left anterior descending coronary artery atherosclerosis. Mitral valve calcification. Bibasilar tree-in-bud reticulonodular opacities compatible with atypical infectious bronchiolitis. These are most concentrated in the right middle lobe. Small type 1  hiatal hernia. Hepatobiliary: Nodularity of the hepatic contour favoring mild cirrhosis morphology. Borderline gallbladder wall thickening. Small gallstones noted in the gallbladder. Common bile duct caliber 7 mm in diameter, within normal limits for age. Pancreas: Unremarkable Spleen: Unremarkable Adrenals/Urinary Tract: Bilateral peripelvic renal cysts are benign. No further imaging workup of these lesions is indicated. Otherwise unremarkable. Stomach/Bowel: Prominent stool throughout the colon favors constipation. Normal appendix. No dilated bowel. Vascular/Lymphatic: Atherosclerosis is present, including aortoiliac atherosclerotic disease. Reproductive: Left eccentric subserosal uterine fibroid unchanged from previous. Other: No supplemental non-categorized findings. Musculoskeletal: Grade 1 degenerative anterolisthesis at L5-S1. Lumbar spondylosis and degenerative disc disease contribute to impingement at L3-4, L4-5, and L5-S1. This impingement is most striking at L3-4. IMPRESSION: 1. Cholelithiasis with borderline gallbladder wall thickening. Correlate clinically in assessing for acute cholecystitis. No biliary dilatation. 2. Prominent stool throughout the colon favors constipation. 3. Bibasilar tree-in-bud reticulonodular opacities compatible with atypical infectious bronchiolitis. These are most concentrated in the right middle lobe. 4. Small type 1 hiatal hernia. 5. Nodularity of the hepatic contour favoring mild cirrhosis morphology. 6. Lumbar impingement at L3-4, L4-5, and L5-S1. 7. Left anterior descending coronary artery atherosclerosis. Aortic Atherosclerosis (ICD10-I70.0). 8. Mitral valve calcification. Electronically Signed   By: Gaylyn Rong M.D.   On: 05/09/2023 20:28   US ABDOMEN LIMITED RUQ (LIVER/GB) Result Date: 05/09/2023 CLINICAL DATA:  Abdominal pain. EXAM: ULTRASOUND ABDOMEN LIMITED RIGHT UPPER QUADRANT COMPARISON:  CT 04/07/2023 FINDINGS: Gallbladder: Cholelithiasis with small  stones layering in the gallbladder. Borderline gallbladder wall thickening at 4 mm. No pericholecystic edema. Murphy's sign is negative. Common bile duct: Diameter: 5 mm, normal Liver: Normal parenchymal echotexture. Mild nodularity to the liver contour, possibly indicating early cirrhosis. No focal lesions identified. Portal vein is patent on color Doppler imaging with normal direction of blood flow towards the liver. Other: None. IMPRESSION: 1. Cholelithiasis with mild gallbladder wall thickening. Murphy's sign is negative. 2. 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 Electronically Signed   By: Burman Nieves M.D.   On: 05/09/2023 19:14      PROCEDURES:  Critical Care performed: No  Procedures   MEDICATIONS ORDERED IN ED: Medications  ondansetron (ZOFRAN) injection 4 mg (4 mg Intravenous Given 05/09/23 1738)  sodium chloride 0.9 % bolus 500 mL (0 mLs Intravenous Stopped 05/09/23 1818)  morphine (PF) 2 MG/ML injection 2 mg (2 mg Intravenous Given 05/09/23 1738)  iohexol (OMNIPAQUE) 300 MG/ML solution 100 mL (100 mLs Intravenous Contrast Given 05/09/23 1938)  cefTRIAXone (ROCEPHIN) 2 g in sodium chloride 0.9 % 100 mL IVPB (0 g Intravenous Stopped 05/09/23 2220)  metroNIDAZOLE (FLAGYL) IVPB 500 mg (0 mg Intravenous Stopped 05/09/23 2319)  sodium chloride  0.9 % bolus 500 mL (500 mLs Intravenous New Bag/Given 05/09/23 2258)     IMPRESSION / MDM / ASSESSMENT AND PLAN / ED COURSE  I reviewed the triage vital signs and the nursing notes.                              Differential diagnosis includes but is not limited to, abdominal perforation, aortic dissection, cholecystitis, appendicitis, diverticulitis, colitis, esophagitis/gastritis, kidney stone, pyelonephritis, urinary tract infection, aortic aneurysm. All are considered in decision and treatment plan. Based upon the patient's presentation and risk factors, will prefer to proceed by obtaining right upper quadrant  ultrasound.  If this does not yield a clear causation would consider CT imaging the abdomen pelvis for what appears to be a febrile illness with associated right upper to right sided abdominal pain fevers chills.  No evidence of acute urinary cause at this time.  No associated chest pain or dyspnea.  Patient's presentation is most consistent with acute complicated illness / injury requiring diagnostic workup.    Patient resting awaiting MRI results.  Patient reports pain improved.  No further nausea or vomiting.  Awaiting results of MRI, Dr. Rosalia Hammers to follow-up  Have discussed case with Dr. Aleen Campi of general surgery who recommended MRCP which result is pending.  Have empirically started the patient on broad-spectrum coverage for intra-abdominal infection particularly concern for potential right upper quadrant or hepatobiliary causation.  She also has tree-in-bud findings on her CT scan but denies any acute chest or pulmonary symptoms aside from slight runny nose.  No obvious findings suggest an obvious acute pneumonia but could also be considered in the differential, nonetheless she will require hospitalization for concerns of sepsis.  She does not have evidence of sepsis with shock or severe sepsis with lactate greater than 4 at this time.  Ongoing care and disposition including anticipated need for admission with the potential need for ERCP or specialty services pending MRCP result.       FINAL CLINICAL IMPRESSION(S) / ED DIAGNOSES   Final diagnoses:  RUQ pain  Sepsis, due to unspecified organism, unspecified whether acute organ dysfunction present (HCC)  Bronchitis     Rx / DC Orders   ED Discharge Orders     None        Note:  This document was prepared using Dragon voice recognition software and may include unintentional dictation errors.   Sharyn Creamer, MD 05/09/23 2351

## 2023-05-09 NOTE — ED Provider Notes (Signed)
 Assumed care of patient at 2320 from prior physician. Briefly, this is a  83 year old female presenting with right-sided abdominal pain, vomiting.  Labs with leukocytosis, transaminitis.  CT equivocal for cholecystitis.  Reviewed with Dr. Aleen Campi by prior provider, recommended MRCP for further evaluation.  Has received empiric Rocephin and Flagyl.  Signed out to me pending MRCP and disposition.  If MRCP demonstrates choledocholithiasis, will likely require transfer to facility with interventional GI.  If no stones present, will plan to discuss with surgery, likely admit here.  MRCP demonstrated cholelithiasis with stones in the gallbladder without overt wall thickening.  It did demonstrate a possible area of mass effect in the ampulla versus impacted stone with recommendation for ERCP for further characterization if indicated.  In the setting of patient's transaminitis, do think patient would benefit from being at a facility that has ERCP capability.  Patient was reassessed and updated on the results of her workup.  I discussed that we do not have a physician on-call at our facility to perform an ERCP.  I recommended transfer to [facility] in [place].  She requests to speak with her family members before making a decision.  I went back to reevaluate the patient and called her emergency contact listed in the chart, Marcelino Duster.  She was agreeable with plan for transfer.  However, patient reports that she would like to talk to additional family members before making any decision.  I discussed the possibility of clinical worsening should she have an impacted stone with cholangitis.  She tells me that she is not willing to make any decisions until she has been able to talk to additional family members.  I did attempt to contact a family member, Nida Boatman, that she wished to speak to, but the call went to voicemail.  Signed out to oncoming physician pending patient decision on management and disposition.    Trinna Post, MD 05/10/23 [phone]

## 2023-05-10 DIAGNOSIS — F03918 Unspecified dementia, unspecified severity, with other behavioral disturbance: Secondary | ICD-10-CM | POA: Diagnosis present

## 2023-05-10 DIAGNOSIS — D72829 Elevated white blood cell count, unspecified: Secondary | ICD-10-CM | POA: Diagnosis not present

## 2023-05-10 DIAGNOSIS — K819 Cholecystitis, unspecified: Secondary | ICD-10-CM | POA: Diagnosis not present

## 2023-05-10 DIAGNOSIS — F0393 Unspecified dementia, unspecified severity, with mood disturbance: Secondary | ICD-10-CM | POA: Diagnosis not present

## 2023-05-10 DIAGNOSIS — K8309 Other cholangitis: Secondary | ICD-10-CM | POA: Diagnosis present

## 2023-05-10 DIAGNOSIS — I110 Hypertensive heart disease with heart failure: Secondary | ICD-10-CM | POA: Diagnosis not present

## 2023-05-10 DIAGNOSIS — F4329 Adjustment disorder with other symptoms: Secondary | ICD-10-CM | POA: Diagnosis not present

## 2023-05-10 DIAGNOSIS — E66811 Obesity, class 1: Secondary | ICD-10-CM | POA: Diagnosis not present

## 2023-05-10 DIAGNOSIS — K8060 Calculus of gallbladder and bile duct with cholecystitis, unspecified, without obstruction: Secondary | ICD-10-CM | POA: Diagnosis not present

## 2023-05-10 DIAGNOSIS — K8020 Calculus of gallbladder without cholecystitis without obstruction: Secondary | ICD-10-CM

## 2023-05-10 DIAGNOSIS — E872 Acidosis, unspecified: Secondary | ICD-10-CM | POA: Diagnosis not present

## 2023-05-10 DIAGNOSIS — D649 Anemia, unspecified: Secondary | ICD-10-CM | POA: Diagnosis not present

## 2023-05-10 DIAGNOSIS — R932 Abnormal findings on diagnostic imaging of liver and biliary tract: Secondary | ICD-10-CM | POA: Diagnosis not present

## 2023-05-10 DIAGNOSIS — J449 Chronic obstructive pulmonary disease, unspecified: Secondary | ICD-10-CM | POA: Diagnosis not present

## 2023-05-10 DIAGNOSIS — F0394 Unspecified dementia, unspecified severity, with anxiety: Secondary | ICD-10-CM | POA: Diagnosis not present

## 2023-05-10 DIAGNOSIS — F410 Panic disorder [episodic paroxysmal anxiety] without agoraphobia: Secondary | ICD-10-CM | POA: Diagnosis not present

## 2023-05-10 DIAGNOSIS — E785 Hyperlipidemia, unspecified: Secondary | ICD-10-CM | POA: Diagnosis not present

## 2023-05-10 DIAGNOSIS — I509 Heart failure, unspecified: Secondary | ICD-10-CM | POA: Diagnosis not present

## 2023-05-10 DIAGNOSIS — E1140 Type 2 diabetes mellitus with diabetic neuropathy, unspecified: Secondary | ICD-10-CM | POA: Diagnosis not present

## 2023-05-10 DIAGNOSIS — K8070 Calculus of gallbladder and bile duct without cholecystitis without obstruction: Secondary | ICD-10-CM | POA: Diagnosis not present

## 2023-05-10 DIAGNOSIS — Z6834 Body mass index (BMI) 34.0-34.9, adult: Secondary | ICD-10-CM | POA: Diagnosis not present

## 2023-05-10 DIAGNOSIS — F4310 Post-traumatic stress disorder, unspecified: Secondary | ICD-10-CM | POA: Diagnosis not present

## 2023-05-10 DIAGNOSIS — M069 Rheumatoid arthritis, unspecified: Secondary | ICD-10-CM | POA: Diagnosis not present

## 2023-05-10 DIAGNOSIS — K8010 Calculus of gallbladder with chronic cholecystitis without obstruction: Secondary | ICD-10-CM | POA: Diagnosis not present

## 2023-05-10 DIAGNOSIS — R6520 Severe sepsis without septic shock: Secondary | ICD-10-CM | POA: Diagnosis not present

## 2023-05-10 DIAGNOSIS — Z8659 Personal history of other mental and behavioral disorders: Secondary | ICD-10-CM | POA: Diagnosis not present

## 2023-05-10 DIAGNOSIS — A4151 Sepsis due to Escherichia coli [E. coli]: Secondary | ICD-10-CM | POA: Diagnosis not present

## 2023-05-10 DIAGNOSIS — Z1612 Extended spectrum beta lactamase (ESBL) resistance: Secondary | ICD-10-CM | POA: Diagnosis not present

## 2023-05-10 DIAGNOSIS — F0392 Unspecified dementia, unspecified severity, with psychotic disturbance: Secondary | ICD-10-CM | POA: Diagnosis not present

## 2023-05-10 DIAGNOSIS — F32A Depression, unspecified: Secondary | ICD-10-CM | POA: Diagnosis not present

## 2023-05-10 DIAGNOSIS — Z1152 Encounter for screening for COVID-19: Secondary | ICD-10-CM | POA: Diagnosis not present

## 2023-05-10 DIAGNOSIS — Z794 Long term (current) use of insulin: Secondary | ICD-10-CM | POA: Diagnosis not present

## 2023-05-10 DIAGNOSIS — K429 Umbilical hernia without obstruction or gangrene: Secondary | ICD-10-CM | POA: Diagnosis not present

## 2023-05-10 DIAGNOSIS — K8030 Calculus of bile duct with cholangitis, unspecified, without obstruction: Secondary | ICD-10-CM | POA: Diagnosis not present

## 2023-05-10 LAB — CBC WITH DIFFERENTIAL/PLATELET
Abs Immature Granulocytes: 0.08 10*3/uL — ABNORMAL HIGH (ref 0.00–0.07)
Basophils Absolute: 0.1 10*3/uL (ref 0.0–0.1)
Basophils Relative: 0 %
Eosinophils Absolute: 0.1 10*3/uL (ref 0.0–0.5)
Eosinophils Relative: 1 %
HCT: 37 % (ref 36.0–46.0)
Hemoglobin: 12.4 g/dL (ref 12.0–15.0)
Immature Granulocytes: 0 %
Lymphocytes Relative: 7 %
Lymphs Abs: 1.3 10*3/uL (ref 0.7–4.0)
MCH: 32 pg (ref 26.0–34.0)
MCHC: 33.5 g/dL (ref 30.0–36.0)
MCV: 95.4 fL (ref 80.0–100.0)
Monocytes Absolute: 1 10*3/uL (ref 0.1–1.0)
Monocytes Relative: 6 %
Neutro Abs: 15.3 10*3/uL — ABNORMAL HIGH (ref 1.7–7.7)
Neutrophils Relative %: 86 %
Platelets: 219 10*3/uL (ref 150–400)
RBC: 3.88 MIL/uL (ref 3.87–5.11)
RDW: 12.9 % (ref 11.5–15.5)
WBC: 17.9 10*3/uL — ABNORMAL HIGH (ref 4.0–10.5)
nRBC: 0 % (ref 0.0–0.2)

## 2023-05-10 LAB — CBG MONITORING, ED
Glucose-Capillary: 131 mg/dL — ABNORMAL HIGH (ref 70–99)
Glucose-Capillary: 117 mg/dL — ABNORMAL HIGH (ref 70–99)

## 2023-05-10 LAB — GLUCOSE, CAPILLARY: Glucose-Capillary: 139 mg/dL — ABNORMAL HIGH (ref 70–99)

## 2023-05-10 LAB — LACTIC ACID, PLASMA
Lactic Acid, Venous: 2.4 mmol/L (ref 0.5–1.9)
Lactic Acid, Venous: 1 mmol/L (ref 0.5–1.9)
Lactic Acid, Venous: 1 mmol/L (ref 0.5–1.9)

## 2023-05-10 LAB — BASIC METABOLIC PANEL
Anion gap: 8 (ref 5–15)
BUN: 14 mg/dL (ref 8–23)
CO2: 23 mmol/L (ref 22–32)
Calcium: 8.3 mg/dL — ABNORMAL LOW (ref 8.9–10.3)
Chloride: 106 mmol/L (ref 98–111)
Creatinine, Ser: 0.7 mg/dL (ref 0.44–1.00)
GFR, Estimated: >60 mL/min (ref >60)
Glucose, Bld: 138 mg/dL — ABNORMAL HIGH (ref 70–99)
Potassium: 4 mmol/L (ref 3.5–5.1)
Sodium: 137 mmol/L (ref 135–145)

## 2023-05-10 LAB — HEPATIC FUNCTION PANEL
ALT: 245 U/L — ABNORMAL HIGH (ref 0–44)
AST: 225 U/L — ABNORMAL HIGH (ref 15–41)
Albumin: 3.2 g/dL — ABNORMAL LOW (ref 3.5–5.0)
Alkaline Phosphatase: 150 U/L — ABNORMAL HIGH (ref 38–126)
Bilirubin, Direct: 2.7 mg/dL — ABNORMAL HIGH (ref 0.0–0.2)
Indirect Bilirubin: 1.4 mg/dL — ABNORMAL HIGH (ref 0.3–0.9)
Total Bilirubin: 4.1 mg/dL — ABNORMAL HIGH (ref 0.0–1.2)
Total Protein: 6.5 g/dL (ref 6.5–8.1)

## 2023-05-10 LAB — HEMOGLOBIN A1C
Hgb A1c MFr Bld: 7.2 % — ABNORMAL HIGH (ref 4.8–5.6)
Mean Plasma Glucose: 159.94 mg/dL

## 2023-05-10 MED ORDER — ONDANSETRON HCL 4 MG PO TABS
4.0000 mg | ORAL_TABLET | Freq: Four times a day (QID) | ORAL | Status: DC | PRN
  Administered 2023-05-11 – 2023-05-18 (×2): 4 mg via ORAL
  Filled 2023-05-10 (×2): qty 1

## 2023-05-10 MED ORDER — SODIUM CHLORIDE 0.9 % IV SOLN
2.0000 g | INTRAVENOUS | Status: DC
  Administered 2023-05-10: 2 g via INTRAVENOUS
  Filled 2023-05-10 (×2): qty 20

## 2023-05-10 MED ORDER — VENLAFAXINE HCL ER 75 MG PO CP24
75.0000 mg | ORAL_CAPSULE | Freq: Every day | ORAL | Status: DC
  Administered 2023-05-10 – 2023-05-16 (×7): 75 mg via ORAL
  Filled 2023-05-10 (×9): qty 1

## 2023-05-10 MED ORDER — GABAPENTIN 100 MG PO CAPS
100.0000 mg | ORAL_CAPSULE | Freq: Three times a day (TID) | ORAL | Status: DC
  Administered 2023-05-10 – 2023-05-18 (×25): 100 mg via ORAL
  Filled 2023-05-10 (×25): qty 1

## 2023-05-10 MED ORDER — HYDROXYZINE HCL 10 MG PO TABS
10.0000 mg | ORAL_TABLET | Freq: Every day | ORAL | Status: DC | PRN
  Administered 2023-05-10 – 2023-05-18 (×4): 10 mg via ORAL
  Filled 2023-05-10 (×6): qty 1

## 2023-05-10 MED ORDER — METRONIDAZOLE 500 MG/100ML IV SOLN
500.0000 mg | Freq: Two times a day (BID) | INTRAVENOUS | Status: DC
  Administered 2023-05-10 – 2023-05-11 (×3): 500 mg via INTRAVENOUS
  Filled 2023-05-10 (×4): qty 100

## 2023-05-10 MED ORDER — ONDANSETRON HCL 4 MG/2ML IJ SOLN: 4.0000 mg | Freq: Four times a day (QID) | INTRAMUSCULAR | Status: DC | PRN

## 2023-05-10 MED ORDER — HYDRALAZINE HCL 20 MG/ML IJ SOLN
5.0000 mg | Freq: Four times a day (QID) | INTRAMUSCULAR | Status: DC | PRN
  Filled 2023-05-10: qty 1

## 2023-05-10 MED ORDER — LORATADINE 10 MG PO TABS
10.0000 mg | ORAL_TABLET | Freq: Every day | ORAL | Status: DC
  Administered 2023-05-10 – 2023-05-18 (×9): 10 mg via ORAL
  Filled 2023-05-10 (×9): qty 1

## 2023-05-10 MED ORDER — ENOXAPARIN SODIUM 40 MG/0.4ML IJ SOSY
40.0000 mg | PREFILLED_SYRINGE | INTRAMUSCULAR | Status: DC
  Administered 2023-05-11 – 2023-05-12 (×2): 40 mg via SUBCUTANEOUS
  Filled 2023-05-10 (×3): qty 0.4

## 2023-05-10 MED ORDER — ACETAMINOPHEN 325 MG PO TABS
650.0000 mg | ORAL_TABLET | Freq: Once | ORAL | Status: AC
  Administered 2023-05-10: 650 mg via ORAL
  Filled 2023-05-10: qty 2

## 2023-05-10 MED ORDER — INSULIN ASPART 100 UNIT/ML IJ SOLN
0.0000 [IU] | Freq: Three times a day (TID) | INTRAMUSCULAR | Status: DC
  Administered 2023-05-10 – 2023-05-11 (×2): 2 [IU] via SUBCUTANEOUS
  Administered 2023-05-11: 3 [IU] via SUBCUTANEOUS
  Administered 2023-05-11: 2 [IU] via SUBCUTANEOUS
  Administered 2023-05-13: 5 [IU] via SUBCUTANEOUS
  Administered 2023-05-14: 3 [IU] via SUBCUTANEOUS
  Administered 2023-05-14: 8 [IU] via SUBCUTANEOUS
  Administered 2023-05-14: 3 [IU] via SUBCUTANEOUS
  Administered 2023-05-15: 2 [IU] via SUBCUTANEOUS
  Administered 2023-05-15: 3 [IU] via SUBCUTANEOUS
  Administered 2023-05-16: 2 [IU] via SUBCUTANEOUS
  Administered 2023-05-16 – 2023-05-17 (×3): 3 [IU] via SUBCUTANEOUS
  Filled 2023-05-10 (×13): qty 1

## 2023-05-10 MED ORDER — BUTALBITAL-APAP-CAFFEINE 50-325-40 MG PO TABS
1.0000 | ORAL_TABLET | Freq: Four times a day (QID) | ORAL | Status: DC | PRN
  Administered 2023-05-10 (×2): 1 via ORAL
  Filled 2023-05-10 (×2): qty 1

## 2023-05-10 MED ORDER — ONDANSETRON HCL 4 MG/2ML IJ SOLN
4.0000 mg | Freq: Once | INTRAMUSCULAR | Status: AC
  Administered 2023-05-10: 4 mg via INTRAVENOUS
  Filled 2023-05-10: qty 2

## 2023-05-10 MED ORDER — ARIPIPRAZOLE 10 MG PO TABS
5.0000 mg | ORAL_TABLET | Freq: Every day | ORAL | Status: DC
Start: 2023-05-10 — End: 2023-05-10

## 2023-05-10 MED ORDER — BUSPIRONE HCL 10 MG PO TABS
15.0000 mg | ORAL_TABLET | Freq: Three times a day (TID) | ORAL | Status: DC
  Administered 2023-05-10 – 2023-05-18 (×24): 15 mg via ORAL
  Filled 2023-05-10 (×4): qty 2
  Filled 2023-05-10: qty 3
  Filled 2023-05-10 (×9): qty 2
  Filled 2023-05-10: qty 3
  Filled 2023-05-10 (×10): qty 2

## 2023-05-10 MED ORDER — PANTOPRAZOLE SODIUM 40 MG PO TBEC
40.0000 mg | DELAYED_RELEASE_TABLET | Freq: Every day | ORAL | Status: DC
  Administered 2023-05-10 – 2023-05-18 (×9): 40 mg via ORAL
  Filled 2023-05-10 (×9): qty 1

## 2023-05-10 MED ORDER — VENLAFAXINE HCL ER 75 MG PO CP24
150.0000 mg | ORAL_CAPSULE | Freq: Every day | ORAL | Status: DC
  Administered 2023-05-10 – 2023-05-18 (×9): 150 mg via ORAL
  Filled 2023-05-10 (×5): qty 2
  Filled 2023-05-10: qty 1
  Filled 2023-05-10 (×3): qty 2

## 2023-05-10 MED ORDER — METOCLOPRAMIDE HCL 5 MG/ML IJ SOLN
10.0000 mg | Freq: Once | INTRAMUSCULAR | Status: AC
  Administered 2023-05-10: 10 mg via INTRAVENOUS
  Filled 2023-05-10: qty 2

## 2023-05-10 MED ORDER — ACETAMINOPHEN 325 MG PO TABS
650.0000 mg | ORAL_TABLET | Freq: Three times a day (TID) | ORAL | Status: DC | PRN
  Administered 2023-05-10 – 2023-05-13 (×5): 650 mg via ORAL
  Filled 2023-05-10 (×5): qty 2

## 2023-05-10 NOTE — Consult Note (Signed)
 Date of Consultation:  05/10/2023  Requesting Physician:  Artis Delay, MD  Reason for Consultation:  Acute cholangitis  History of Present Illness: Dawn [name] is a 83 y.o. female presenting to the ED yesterday with 2 day history of RUQ abdominal pain associated with nausea and vomiting.  Her work up in the ED showed elevated WBC of 17.6, elevated LFTs with total bili of 1.9, AST 352, ALT 204, Alk Phos 189.  Her lactic acid was 2.8 and she was tachycardic on initial presentation.  CT scan and U/S were not definitive for cholecystitis, but given her elevated LFTs, MRCP was obtained.  This showed likely choledocholithiasis with abrupt termination of the CBD at the ampulla.  Her tbili this morning worsened with total bili of 4.1, AST 225, ALT 245, and Alk Phos 150, while her WBC remained elevated at 17.9.  Her lactic acidosis resolved.  Given these findings, it was recommended that she undergo ERCP.  She was evaluated by Dr. Allegra Lai and will be scheduled for ERCP tomorrow 05/11/23.  Upon my evaluation of this patient, she seems unsure of why she's in the hospital and reports that she's very anxious about any surgery and that it is affecting her psyche.  She does report having a history of PTSD as well.  Past Medical History: Past Medical History:  Diagnosis Date   CHF (congestive heart failure) (HCC)    Colitis, ischemic (HCC)    Fibromyalgia    Hypertension    Leaky heart valve    Lupus    Osteoarthritis    PTSD (post-traumatic stress disorder)    Rheumatoid arthritis (HCC)      Past Surgical History: Past Surgical History:  Procedure Laterality Date   TONSILLECTOMY      Home Medications: Prior to Admission medications   Medication Sig Start Date End Date Taking? Authorizing Provider  acetaminophen (TYLENOL) 650 MG CR tablet Take 650 mg by mouth every 8 (eight) hours as needed for pain.   Yes [provider]  amLODipine (NORVASC) 5 MG tablet Take 5 mg by mouth daily. 12/22/22   Yes [provider]  B Complex-C (B-COMPLEX WITH VITAMIN C) tablet Take 1 tablet by mouth daily.   Yes [provider]  Biotin 2.5 MG CAPS Take 10 capsules by mouth 3 (three) times daily. 08/22/22  Yes [provider]  busPIRone (BUSPAR) 15 MG tablet Take 15 mg by mouth 3 (three) times daily. 12/22/22  Yes [provider]  cetirizine (ZYRTEC) 10 MG tablet Take 10 mg by mouth daily.   Yes [provider]  cholecalciferol (VITAMIN D3) 25 MCG (1000 UNIT) tablet Take 2,000 Units by mouth daily.   Yes [provider]  diclofenac Sodium (VOLTAREN) 1 % GEL Apply 2 g topically 3 (three) times daily as needed (Pain).   Yes [provider]  gabapentin (NEURONTIN) 100 MG capsule Take 100 mg by mouth 3 (three) times daily. 12/22/22  Yes [provider]  hydrocortisone cream 0.5 % Apply 1 Application topically 3 (three) times daily as needed for itching.   Yes [provider]  magnesium gluconate (MAGONATE) 500 (27 Mg) MG TABS tablet Take 500 mg by mouth in the morning and at bedtime.   Yes [provider]  melatonin 5 MG TABS Take 10 mg by mouth at bedtime.   Yes [provider]  metFORMIN (GLUCOPHAGE) 850 MG tablet Take 850 mg by mouth 2 (two) times daily. 12/24/22  Yes [provider]  Multiple  Vitamin (MULTIVITAMIN) LIQD Take 5 mLs by mouth daily.   Yes [provider]  ondansetron (ZOFRAN) 4 MG tablet Take 4 mg by mouth every 6 (six) hours as needed for nausea or vomiting.   Yes [provider]  pantoprazole (PROTONIX) 40 MG tablet Take 1 tablet (40 mg total) by mouth daily. 11/25/21  Yes Margarita Mail, DO  risperiDONE (RISPERDAL) 0.25 MG tablet Take 0.25 mg by mouth daily.   Yes [provider]  venlafaxine XR (EFFEXOR XR) 150 MG 24 hr capsule Take 150 mg by mouth every morning. 10/08/21  Yes Margarita Mail, DO  venlafaxine XR (EFFEXOR XR) 75 MG 24 hr capsule Take 75 mg  in every evening. 10/08/21  Yes Margarita Mail, DO  ARIPiprazole (ABILIFY) 5 MG tablet Take 1 tablet (5 mg total) by mouth daily. Patient not taking: Reported on 05/10/2023 11/25/21   Margarita Mail, DO  hydrOXYzine (ATARAX) 10 MG tablet Take 1 tablet (10 mg total) by mouth daily as needed for anxiety. Patient not taking: Reported on 05/10/2023 11/25/21   Margarita Mail, DO  losartan (COZAAR) 50 MG tablet Take 1 tablet (50 mg total) by mouth daily. Patient not taking: Reported on 05/10/2023 04/07/23 04/06/24  Merwyn Katos, MD  NYSTATIN powder Apply 1 Application topically 2 (two) times daily as needed (Rash).    [provider]    Allergies: Allergies  Allergen Reactions   Doxycycline Nausea And Vomiting   Nsaids Other (See Comments)    GI Bleed   Penicillin G Swelling   Sulfa Antibiotics Nausea And Vomiting    Social History:  reports that she has never smoked. She does not have any smokeless tobacco history on file. She reports that she does not currently use drugs. She reports that she does not drink alcohol.   Family History: Family History  Problem Relation Age of Onset   Cancer Mother    Alzheimer's disease Mother    Cancer Father        prostate   ADD / ADHD Son     Review of Systems: Review of Systems  Constitutional:  Negative for chills and fever.  Respiratory:  Negative for shortness of breath.   Cardiovascular:  Negative for chest pain.  Gastrointestinal:  Positive for abdominal pain, nausea and vomiting.  Genitourinary:  Negative for dysuria.  Musculoskeletal:  Negative for back pain.  Neurological:  Negative for dizziness.  Psychiatric/Behavioral:  Negative for depression.     Physical Exam BP (!) 163/93   Pulse 80   Temp 97.9 F (36.6 C) (Oral)   Resp 18   SpO2 97%  CONSTITUTIONAL: No acute distress, appears comfortable. HEENT:  Normocephalic, atraumatic, extraocular motion intact. NECK: Trachea is midline, and there is no jugular  venous distension. RESPIRATORY:  Normal respiratory effort without pathologic use of accessory muscles. CARDIOVASCULAR: Regular rhythm and rate. GI: The abdomen is soft, non-distended, currently non-tender.  MUSCULOSKELETAL:  Normal muscle strength and tone in all four extremities.  No peripheral edema or cyanosis. SKIN: Skin turgor is normal. There are no pathologic skin lesions.  NEUROLOGIC:  Motor and sensation is grossly normal.  Cranial nerves are grossly intact. PSYCH:  Alert and oriented to person, place and time. Affect is normal.  Laboratory Analysis: Results for orders placed or performed during the hospital encounter of 05/09/23 (from the past 24 hours)  Lipase, blood     Status: None   Collection Time: 05/09/23  2:40 PM  Result Value Ref Range   Lipase  21 11 - 51 U/L  Comprehensive metabolic panel     Status: Abnormal   Collection Time: 05/09/23  2:40 PM  Result Value Ref Range   Sodium 135 135 - 145 mmol/L   Potassium 3.8 3.5 - 5.1 mmol/L   Chloride 103 98 - 111 mmol/L   CO2 26 22 - 32 mmol/L   Glucose, Bld 170 (H) 70 - 99 mg/dL   BUN 19 8 - 23 mg/dL   Creatinine, Ser 1.61 0.44 - 1.00 mg/dL   Calcium 8.1 (L) 8.9 - 10.3 mg/dL   Total Protein 6.7 6.5 - 8.1 g/dL   Albumin 3.7 3.5 - 5.0 g/dL   AST 096 (H) 15 - 41 U/L   ALT 204 (H) 0 - 44 U/L   Alkaline Phosphatase 189 (H) 38 - 126 U/L   Total Bilirubin 1.9 (H) 0.0 - 1.2 mg/dL   GFR, Estimated >04 >54 mL/min   Anion gap 6 5 - 15  CBC     Status: Abnormal   Collection Time: 05/09/23  2:40 PM  Result Value Ref Range   WBC 17.6 (H) 4.0 - 10.5 K/uL   RBC 4.28 3.87 - 5.11 MIL/uL   Hemoglobin 13.7 12.0 - 15.0 g/dL   HCT 09.8 11.9 - 14.7 %   MCV 97.4 80.0 - 100.0 fL   MCH 32.0 26.0 - 34.0 pg   MCHC 32.9 30.0 - 36.0 g/dL   RDW 82.9 56.2 - 13.0 %   Platelets 225 150 - 400 K/uL   nRBC 0.0 0.0 - 0.2 %  Resp panel by RT-PCR (RSV, Flu A&B, Covid) Anterior Nasal Swab     Status: None   Collection Time: 05/09/23  2:40 PM    Specimen: Anterior Nasal Swab  Result Value Ref Range   SARS Coronavirus 2 by RT PCR NEGATIVE NEGATIVE   Influenza A by PCR NEGATIVE NEGATIVE   Influenza B by PCR NEGATIVE NEGATIVE   Resp Syncytial Virus by PCR NEGATIVE NEGATIVE  Urinalysis, Routine w reflex microscopic -Urine, Clean Catch     Status: Abnormal   Collection Time: 05/09/23  5:44 PM  Result Value Ref Range   Color, Urine YELLOW (A) YELLOW   APPearance CLEAR (A) CLEAR   Specific Gravity, Urine 1.017 1.005 - 1.030   pH 6.0 5.0 - 8.0   Glucose, UA NEGATIVE NEGATIVE mg/dL   Hgb urine dipstick NEGATIVE NEGATIVE   Bilirubin Urine NEGATIVE NEGATIVE   Ketones, ur NEGATIVE NEGATIVE mg/dL   Protein, ur NEGATIVE NEGATIVE mg/dL   Nitrite NEGATIVE NEGATIVE   Leukocytes,Ua NEGATIVE NEGATIVE  Blood Culture (routine x 2)     Status: None (Preliminary result)   Collection Time: 05/09/23  8:15 PM   Specimen: BLOOD  Result Value Ref Range   Specimen Description BLOOD LEFT AC    Special Requests      BOTTLES DRAWN AEROBIC AND ANAEROBIC Blood Culture adequate volume   Culture      NO GROWTH < 12 HOURS Performed at [facility], [address] [place], [place] [address]    Report Status PENDING   Blood Culture (routine x 2)     Status: None (Preliminary result)   Collection Time: 05/09/23  8:31 PM   Specimen: BLOOD  Result Value Ref Range   Specimen Description BLOOD RIGHT FA    Special Requests      BOTTLES DRAWN AEROBIC AND ANAEROBIC Blood Culture results may not be optimal due to an inadequate volume of blood received in  culture bottles   Culture      NO GROWTH < 12 HOURS Performed at [facility], [address] [place], [place] [address]    Report Status PENDING   Lactic acid, plasma     Status: Abnormal   Collection Time: 05/09/23  9:26 PM  Result Value Ref Range   Lactic Acid, Venous 2.8 (HH) 0.5 - 1.9 mmol/L  Lactic acid, plasma     Status: Abnormal   Collection Time: 05/09/23 11:27 PM  Result  Value Ref Range   Lactic Acid, Venous 2.4 (HH) 0.5 - 1.9 mmol/L  Hepatic function panel     Status: Abnormal   Collection Time: 05/10/23  9:22 AM  Result Value Ref Range   Total Protein 6.5 6.5 - 8.1 g/dL   Albumin 3.2 (L) 3.5 - 5.0 g/dL   AST 784 (H) 15 - 41 U/L   ALT 245 (H) 0 - 44 U/L   Alkaline Phosphatase 150 (H) 38 - 126 U/L   Total Bilirubin 4.1 (H) 0.0 - 1.2 mg/dL   Bilirubin, Direct 2.7 (H) 0.0 - 0.2 mg/dL   Indirect Bilirubin 1.4 (H) 0.3 - 0.9 mg/dL  Basic metabolic panel     Status: Abnormal   Collection Time: 05/10/23  9:22 AM  Result Value Ref Range   Sodium 137 135 - 145 mmol/L   Potassium 4.0 3.5 - 5.1 mmol/L   Chloride 106 98 - 111 mmol/L   CO2 23 22 - 32 mmol/L   Glucose, Bld 138 (H) 70 - 99 mg/dL   BUN 14 8 - 23 mg/dL   Creatinine, Ser 6.96 0.44 - 1.00 mg/dL   Calcium 8.3 (L) 8.9 - 10.3 mg/dL   GFR, Estimated >29 >52 mL/min   Anion gap 8 5 - 15  CBC with Differential     Status: Abnormal   Collection Time: 05/10/23  9:22 AM  Result Value Ref Range   WBC 17.9 (H) 4.0 - 10.5 K/uL   RBC 3.88 3.87 - 5.11 MIL/uL   Hemoglobin 12.4 12.0 - 15.0 g/dL   HCT 84.1 32.4 - 40.1 %   MCV 95.4 80.0 - 100.0 fL   MCH 32.0 26.0 - 34.0 pg   MCHC 33.5 30.0 - 36.0 g/dL   RDW 02.7 25.3 - 66.4 %   Platelets 219 150 - 400 K/uL   nRBC 0.0 0.0 - 0.2 %   Neutrophils Relative % 86 %   Neutro Abs 15.3 (H) 1.7 - 7.7 K/uL   Lymphocytes Relative 7 %   Lymphs Abs 1.3 0.7 - 4.0 K/uL   Monocytes Relative 6 %   Monocytes Absolute 1.0 0.1 - 1.0 K/uL   Eosinophils Relative 1 %   Eosinophils Absolute 0.1 0.0 - 0.5 K/uL   Basophils Relative 0 %   Basophils Absolute 0.1 0.0 - 0.1 K/uL   Immature Granulocytes 0 %   Abs Immature Granulocytes 0.08 (H) 0.00 - 0.07 K/uL  Lactic acid, plasma     Status: None   Collection Time: 05/10/23  9:22 AM  Result Value Ref Range   Lactic Acid, Venous 1.0 0.5 - 1.9 mmol/L  Lactic acid, plasma     Status: None   Collection Time: 05/10/23 11:04 AM   Result Value Ref Range   Lactic Acid, Venous 1.0 0.5 - 1.9 mmol/L  CBG monitoring, ED     Status: Abnormal   Collection Time: 05/10/23 12:03 PM  Result Value Ref Range   Glucose-Capillary 131 (H) 70 -  99 mg/dL    Imaging: MR ABDOMEN MRCP WO CONTRAST Result Date: 05/10/2023 CLINICAL DATA:  Cholelithiasis.  Abdominal pain. EXAM: MRI ABDOMEN WITHOUT CONTRAST  (INCLUDING MRCP) TECHNIQUE: Multiplanar multisequence MR imaging of the abdomen was performed. Heavily T2-weighted images of the biliary and pancreatic ducts were obtained, and three-dimensional MRCP images were rendered by post processing. COMPARISON:  CT and ultrasound exams from earlier same day FINDINGS: Lower chest: No acute findings. Hepatobiliary: No intrahepatic biliary duct dilatation. Common bile duct measures 6 mm diameter, upper normal. MRCP imaging demonstrates abrupt termination of the common bile duct at the ampulla on some series (see image 4 of series 18 and image 10 of series 17) which raises concern for an impacted stone at the ampulla although this could represent mass-effect on the duct by the ampulla itself. No other biliary duct stones evident. Gallbladder shows numerous tiny layering 1-2 mm stones without appreciable gallbladder wall thickening. While there is no substantial pericholecystic edema or fluid, a trace amount of fluid between the liver capsule and the gallbladder wall is not excluded. Pancreas: No focal mass lesion. No dilatation of the main duct. No intraparenchymal cyst. No peripancreatic edema. Spleen:  No splenomegaly. No suspicious focal mass lesion. Adrenals/Urinary Tract: No adrenal nodule or mass. Central sinus cysts are noted in both kidneys. Tiny cortical T2 hyperintensities in both kidneys are too small to characterize but statistically most likely benign and also compatible with cyst. No followup imaging is recommended. Stomach/Bowel: Stomach is decompressed. Duodenum is normally positioned as is the  ligament of Treitz. No small bowel or colonic dilatation within the visualized abdomen. Vascular/Lymphatic: No abdominal aortic aneurysm no abdominal lymphadenopathy Other:  No intraperitoneal free fluid. Musculoskeletal: No suspicious marrow signal abnormality IMPRESSION: 1. Cholelithiasis with numerous 1-2 mm tiny stones in the lumen of the gallbladder. No overt gallbladder wall thickening, but some trace fluid between the gallbladder in the liver capsule cannot be excluded. 2. No intrahepatic biliary duct dilatation. Common bile duct measures 6 mm diameter, upper normal with relatively abrupt termination at the ampulla. The common bile duct has a concave terminus, bulging into the duct, potentially from mass effect of the ampulla but impacted stone at the ampulla could also have this appearance. ERCP could be used to further characterize, as clinically warranted. 3. No other acute findings in the abdomen. Electronically Signed   By: Kennith Center M.D.   On: 05/10/2023 05:26   MR 3D Recon At Scanner Result Date: 05/10/2023 CLINICAL DATA:  Cholelithiasis.  Abdominal pain. EXAM: MRI ABDOMEN WITHOUT CONTRAST  (INCLUDING MRCP) TECHNIQUE: Multiplanar multisequence MR imaging of the abdomen was performed. Heavily T2-weighted images of the biliary and pancreatic ducts were obtained, and three-dimensional MRCP images were rendered by post processing. COMPARISON:  CT and ultrasound exams from earlier same day FINDINGS: Lower chest: No acute findings. Hepatobiliary: No intrahepatic biliary duct dilatation. Common bile duct measures 6 mm diameter, upper normal. MRCP imaging demonstrates abrupt termination of the common bile duct at the ampulla on some series (see image 4 of series 18 and image 10 of series 17) which raises concern for an impacted stone at the ampulla although this could represent mass-effect on the duct by the ampulla itself. No other biliary duct stones evident. Gallbladder shows numerous tiny layering  1-2 mm stones without appreciable gallbladder wall thickening. While there is no substantial pericholecystic edema or fluid, a trace amount of fluid between the liver capsule and the gallbladder wall is not excluded. Pancreas: No focal mass  lesion. No dilatation of the main duct. No intraparenchymal cyst. No peripancreatic edema. Spleen:  No splenomegaly. No suspicious focal mass lesion. Adrenals/Urinary Tract: No adrenal nodule or mass. Central sinus cysts are noted in both kidneys. Tiny cortical T2 hyperintensities in both kidneys are too small to characterize but statistically most likely benign and also compatible with cyst. No followup imaging is recommended. Stomach/Bowel: Stomach is decompressed. Duodenum is normally positioned as is the ligament of Treitz. No small bowel or colonic dilatation within the visualized abdomen. Vascular/Lymphatic: No abdominal aortic aneurysm no abdominal lymphadenopathy Other:  No intraperitoneal free fluid. Musculoskeletal: No suspicious marrow signal abnormality IMPRESSION: 1. Cholelithiasis with numerous 1-2 mm tiny stones in the lumen of the gallbladder. No overt gallbladder wall thickening, but some trace fluid between the gallbladder in the liver capsule cannot be excluded. 2. No intrahepatic biliary duct dilatation. Common bile duct measures 6 mm diameter, upper normal with relatively abrupt termination at the ampulla. The common bile duct has a concave terminus, bulging into the duct, potentially from mass effect of the ampulla but impacted stone at the ampulla could also have this appearance. ERCP could be used to further characterize, as clinically warranted. 3. No other acute findings in the abdomen. Electronically Signed   By: Kennith Center M.D.   On: 05/10/2023 05:26   CT ABDOMEN PELVIS W CONTRAST Result Date: 05/09/2023 CLINICAL DATA:  Right-sided abdominal pain. Nausea and vomiting. Cholelithiasis. EXAM: CT ABDOMEN AND PELVIS WITH CONTRAST TECHNIQUE:  Multidetector CT imaging of the abdomen and pelvis was performed using the standard protocol following bolus administration of intravenous contrast. RADIATION DOSE REDUCTION: This exam was performed according to the departmental dose-optimization program which includes automated exposure control, adjustment of the mA and/or kV according to patient size and/or use of iterative reconstruction technique. CONTRAST:  OMNIPAQUE IOHEXOL 300 MG/ML  SOLN COMPARISON:  Ultrasound 05/09/2023 and CT scan 04/07/2023 FINDINGS: Lower chest: Left anterior descending coronary artery atherosclerosis. Mitral valve calcification. Bibasilar tree-in-bud reticulonodular opacities compatible with atypical infectious bronchiolitis. These are most concentrated in the right middle lobe. Small type 1 hiatal hernia. Hepatobiliary: Nodularity of the hepatic contour favoring mild cirrhosis morphology. Borderline gallbladder wall thickening. Small gallstones noted in the gallbladder. Common bile duct caliber 7 mm in diameter, within normal limits for age. Pancreas: Unremarkable Spleen: Unremarkable Adrenals/Urinary Tract: Bilateral peripelvic renal cysts are benign. No further imaging workup of these lesions is indicated. Otherwise unremarkable. Stomach/Bowel: Prominent stool throughout the colon favors constipation. Normal appendix. No dilated bowel. Vascular/Lymphatic: Atherosclerosis is present, including aortoiliac atherosclerotic disease. Reproductive: Left eccentric subserosal uterine fibroid unchanged from previous. Other: No supplemental non-categorized findings. Musculoskeletal: Grade 1 degenerative anterolisthesis at L5-S1. Lumbar spondylosis and degenerative disc disease contribute to impingement at L3-4, L4-5, and L5-S1. This impingement is most striking at L3-4. IMPRESSION: 1. Cholelithiasis with borderline gallbladder wall thickening. Correlate clinically in assessing for acute cholecystitis. No biliary dilatation. 2. Prominent  stool throughout the colon favors constipation. 3. Bibasilar tree-in-bud reticulonodular opacities compatible with atypical infectious bronchiolitis. These are most concentrated in the right middle lobe. 4. Small type 1 hiatal hernia. 5. Nodularity of the hepatic contour favoring mild cirrhosis morphology. 6. Lumbar impingement at L3-4, L4-5, and L5-S1. 7. Left anterior descending coronary artery atherosclerosis. Aortic Atherosclerosis (ICD10-I70.0). 8. Mitral valve calcification. Electronically Signed   By: Gaylyn Rong M.D.   On: 05/09/2023 20:28   US ABDOMEN LIMITED RUQ (LIVER/GB) Result Date: 05/09/2023 CLINICAL DATA:  Abdominal pain. EXAM: ULTRASOUND ABDOMEN LIMITED RIGHT UPPER QUADRANT  COMPARISON:  CT 04/07/2023 FINDINGS: Gallbladder: Cholelithiasis with small stones layering in the gallbladder. Borderline gallbladder wall thickening at 4 mm. No pericholecystic edema. Murphy's sign is negative. Common bile duct: Diameter: 5 mm, normal Liver: Normal parenchymal echotexture. Mild nodularity to the liver contour, possibly indicating early cirrhosis. No focal lesions identified. Portal vein is patent on color Doppler imaging with normal direction of blood flow towards the liver. Other: None. IMPRESSION: 1. Cholelithiasis with mild gallbladder wall thickening. Murphy's sign is negative. 2. 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 Electronically Signed   By: Burman Nieves M.D.   On: 05/09/2023 19:14    Assessment and Plan: This is a 83 y.o. female with choledocholithiasis and likely cholangitis.  --Discussed with the patient the findings on her imaging studies and labs.  Currently she does not appear to be symptomatic, but she was certainly having pain, nausea, vomiting on initial presentation, in addition to elevated lactic acid and LFTs.  MRCP does show the possibility of choledocholithiasis, and given all the findings, I do agree that ERCP is the next reasonable step  to do. --Discussed with the patient the recommendation for cholecystectomy to prevent further episodes of choledocholithiasis.  Discussed with her how the gallbladder is the source of this episode with the stones inside and the definitive management is with cholecystectomy.  She appears very nervous already about any procedures and is not sure about surgery.   --For now, discussed with the patient that at the very minimum she should get the ERCP to clear the common bile duct.  We can discuss further after about timing of surgery if she chooses to proceed that way. --Continue IV abx, ok for clears during daytime and NPO after midnight.  Will continue to follow.  I spent 60 minutes dedicated to the care of this patient on the date of this encounter to include pre-visit review of records, face-to-face time with the patient discussing diagnosis and management, and any post-visit coordination of care.   Howie Ill, MD  [facility]:  [phone]

## 2023-05-10 NOTE — Consult Note (Signed)
 Arlyss Repress, MD [address]  [address]  [place], [place] [address]  Main: [phone]  Fax: [phone] Pager: [phone]   Consultation  Referring Provider:     No ref. provider found Primary Care Physician:  [facility] Primary Gastroenterologist: Gentry Fitz         Reason for Consultation: Evaluate for ERCP  Date of Admission:  05/09/2023 Date of Consultation:  05/10/2023         HPI:   Dawn [name] is a 83 y.o. female with history of CHF, hypertension presented yesterday to ER with 2 days history of right upper quadrant pain, nausea and vomiting as well as fever of 101.  Ultrasound revealed cholelithiasis with mild gallbladder wall thickening, possible cirrhosis of liver, normal CBD.  Labs reveal normal serum lipase, LFTs significant for elevated alkaline phosphatase 189, AST 352, ALT 204, T. bili 1.9.  Serum lactate 2.8, significant leukocytosis 17.6, normal hemoglobin, lipase, normal renal function.  She received ceftriaxone and underwent MRCP which revealed CBD 6 mm diameter upper limit of normal, abrupt termination of CBD at ampulla raising concern for an impacted stone at the ampulla, cholelithiasis her LFTs were elevated in 03/2023 as well with normal T. bili.  Labs today reveal persistent leukocytosis 17.9, normal serum lactate after receiving IV fluids, AST 225, ALT 245, alkaline phosphatase 150, T. bili 4.1, T. bili 2.7.  General surgery is on board     Latest Ref Rng & Units 05/10/2023    9:22 AM 05/09/2023    2:40 PM 04/07/2023   11:53 AM  Hepatic Function  Total Protein 6.5 - 8.1 g/dL 6.5  6.7  6.8   Albumin 3.5 - 5.0 g/dL 3.2  3.7  3.5   AST 15 - 41 U/L 225  352  53   ALT 0 - 44 U/L 245  204  101   Alk Phosphatase 38 - 126 U/L 150  189  178   Total Bilirubin 0.0 - 1.2 mg/dL 4.1  1.9  1.2   Bilirubin, Direct 0.0 - 0.2 mg/dL 2.7      Patient is very pleasant, laying in the bed, reports mild soreness in right upper quadrant area.  Very  independent, lives alone She does not smoke or drink alcohol  NSAIDs: None  Antiplts/Anticoagulants/Anti thrombotics: None  GI Procedures: None  Past Medical History:  Diagnosis Date   CHF (congestive heart failure) (HCC)    Colitis, ischemic (HCC)    Fibromyalgia    Hypertension    Leaky heart valve    Lupus    Osteoarthritis    PTSD (post-traumatic stress disorder)    Rheumatoid arthritis (HCC)     Past Surgical History:  Procedure Laterality Date   TONSILLECTOMY       Current Facility-Administered Medications:    acetaminophen (TYLENOL) tablet 650 mg, 650 mg, Oral, Q8H PRN, Mikey College T, MD, 650 mg at 05/10/23 1509   busPIRone (BUSPAR) tablet 15 mg, 15 mg, Oral, TID, Mikey College T, MD, 15 mg at 05/10/23 1509   cefTRIAXone (ROCEPHIN) 2 g in sodium chloride 0.9 % 100 mL IVPB, 2 g, Intravenous, Q24H, Mikey College T, MD, Stopped at 05/10/23 1204   enoxaparin (LOVENOX) injection 40 mg, 40 mg, Subcutaneous, Q24H, Zhang, Ping T, MD   gabapentin (NEURONTIN) capsule 100 mg, 100 mg, Oral, TID, Mikey College T, MD, 100 mg at 05/10/23 1509   hydrALAZINE (APRESOLINE) injection 5 mg, 5 mg, Intravenous, Q6H PRN, Mikey College T,  MD   hydrOXYzine (ATARAX) tablet 10 mg, 10 mg, Oral, Daily PRN, Mikey College T, MD   insulin aspart (novoLOG) injection 0-15 Units, 0-15 Units, Subcutaneous, TID WC, Emeline General, MD, 2 Units at 05/10/23 1228   loratadine (CLARITIN) tablet 10 mg, 10 mg, Oral, Daily, Mikey College T, MD, 10 mg at 05/10/23 1227   metroNIDAZOLE (FLAGYL) IVPB 500 mg, 500 mg, Intravenous, Q12H, Emeline General, MD, Stopped at 05/10/23 1205   ondansetron (ZOFRAN) tablet 4 mg, 4 mg, Oral, Q6H PRN **OR** ondansetron (ZOFRAN) injection 4 mg, 4 mg, Intravenous, Q6H PRN, Emeline General, MD   pantoprazole (PROTONIX) EC tablet 40 mg, 40 mg, Oral, Daily, Mikey College T, MD, 40 mg at 05/10/23 1227   venlafaxine XR (EFFEXOR-XR) 24 hr capsule 150 mg, 150 mg, Oral, Q breakfast, Mikey College T, MD, 150 mg at  05/10/23 1228   venlafaxine XR (EFFEXOR-XR) 24 hr capsule 75 mg, 75 mg, Oral, QHS, Emeline General, MD  Current Outpatient Medications:    acetaminophen (TYLENOL) 650 MG CR tablet, Take 650 mg by mouth every 8 (eight) hours as needed for pain., Disp: , Rfl:    amLODipine (NORVASC) 5 MG tablet, Take 5 mg by mouth daily., Disp: , Rfl:    B Complex-C (B-COMPLEX WITH VITAMIN C) tablet, Take 1 tablet by mouth daily., Disp: , Rfl:    Biotin 2.5 MG CAPS, Take 10 capsules by mouth 3 (three) times daily., Disp: , Rfl:    busPIRone (BUSPAR) 15 MG tablet, Take 15 mg by mouth 3 (three) times daily., Disp: , Rfl:    cetirizine (ZYRTEC) 10 MG tablet, Take 10 mg by mouth daily., Disp: , Rfl:    cholecalciferol (VITAMIN D3) 25 MCG (1000 UNIT) tablet, Take 2,000 Units by mouth daily., Disp: , Rfl:    diclofenac Sodium (VOLTAREN) 1 % GEL, Apply 2 g topically 3 (three) times daily as needed (Pain)., Disp: , Rfl:    gabapentin (NEURONTIN) 100 MG capsule, Take 100 mg by mouth 3 (three) times daily., Disp: , Rfl:    hydrocortisone cream 0.5 %, Apply 1 Application topically 3 (three) times daily as needed for itching., Disp: , Rfl:    magnesium gluconate (MAGONATE) 500 (27 Mg) MG TABS tablet, Take 500 mg by mouth in the morning and at bedtime., Disp: , Rfl:    melatonin 5 MG TABS, Take 10 mg by mouth at bedtime., Disp: , Rfl:    metFORMIN (GLUCOPHAGE) 850 MG tablet, Take 850 mg by mouth 2 (two) times daily., Disp: , Rfl:    Multiple Vitamin (MULTIVITAMIN) LIQD, Take 5 mLs by mouth daily., Disp: , Rfl:    ondansetron (ZOFRAN) 4 MG tablet, Take 4 mg by mouth every 6 (six) hours as needed for nausea or vomiting., Disp: , Rfl:    pantoprazole (PROTONIX) 40 MG tablet, Take 1 tablet (40 mg total) by mouth daily., Disp: 90 tablet, Rfl: 1   risperiDONE (RISPERDAL) 0.25 MG tablet, Take 0.25 mg by mouth daily., Disp: , Rfl:    venlafaxine XR (EFFEXOR XR) 150 MG 24 hr capsule, Take 150 mg by mouth every morning., Disp: 90 capsule,  Rfl: 1   venlafaxine XR (EFFEXOR XR) 75 MG 24 hr capsule, Take 75 mg in every evening., Disp: 90 capsule, Rfl: 1   ARIPiprazole (ABILIFY) 5 MG tablet, Take 1 tablet (5 mg total) by mouth daily. (Patient not taking: Reported on 05/10/2023), Disp: 90 tablet, Rfl: 0   hydrOXYzine (ATARAX) 10 MG tablet, Take  1 tablet (10 mg total) by mouth daily as needed for anxiety. (Patient not taking: Reported on 05/10/2023), Disp: 90 tablet, Rfl: 0   losartan (COZAAR) 50 MG tablet, Take 1 tablet (50 mg total) by mouth daily. (Patient not taking: Reported on 05/10/2023), Disp: 30 tablet, Rfl: 11   NYSTATIN powder, Apply 1 Application topically 2 (two) times daily as needed (Rash)., Disp: , Rfl:    Family History  Problem Relation Age of Onset   Cancer Mother    Alzheimer's disease Mother    Cancer Father        prostate   ADD / ADHD Son      Social History   Tobacco Use   Smoking status: Never  Vaping Use   Vaping status: Never Used  Substance Use Topics   Alcohol use: No   Drug use: Not Currently    Allergies as of 05/09/2023 - Review Complete 05/09/2023  Allergen Reaction Noted   Doxycycline Nausea And Vomiting 05/31/2014   Nsaids Other (See Comments) 06/03/2021   Penicillin g Swelling 05/31/2014   Sulfa antibiotics Nausea And Vomiting 05/31/2014    Review of Systems:    All systems reviewed and negative except where noted in HPI.   Physical Exam:  Vital signs in last 24 hours: Temp:  [97.9 F (36.6 C)-98.4 F (36.9 C)] 98.2 F (36.8 C) (03/23 1440) Pulse Rate:  [79-97] 88 (03/23 1537) Resp:  [13-23] 17 (03/23 1537) BP: (102-163)/(51-93) 153/78 (03/23 1440) SpO2:  [92 %-100 %] 99 % (03/23 1537)   General:   Pleasant, cooperative in NAD Head:  Normocephalic and atraumatic. Eyes:   No icterus.   Conjunctiva pink. PERRLA. Ears:  Normal auditory acuity. Neck:  Supple; no masses or thyroidomegaly Lungs: Respirations even and unlabored. Lungs clear to auscultation bilaterally.   No  wheezes, crackles, or rhonchi.  Heart:  Regular rate and rhythm;  Without murmur, clicks, rubs or gallops Abdomen:  Soft, nondistended, mild right upper quadrant tenderness. Normal bowel sounds. No appreciable masses or hepatomegaly.  No rebound or guarding.  Rectal:  Not performed. Msk:  Symmetrical without gross deformities.  Strength normal Extremities:  Without edema, cyanosis or clubbing. Neurologic:  Alert and oriented x3;  grossly normal neurologically. Skin:  Intact without significant lesions or rashes. Psych:  Alert and cooperative. Normal affect.  LAB RESULTS:    Latest Ref Rng & Units 05/10/2023    9:22 AM 05/09/2023    2:40 PM 04/07/2023   11:53 AM  CBC  WBC 4.0 - 10.5 K/uL 17.9  17.6  10.7   Hemoglobin 12.0 - 15.0 g/dL 16.1  09.6  04.5   Hematocrit 36.0 - 46.0 % 37.0  41.7  40.0   Platelets 150 - 400 K/uL 219  225  294     BMET    Latest Ref Rng & Units 05/10/2023    9:22 AM 05/09/2023    2:40 PM 04/07/2023   11:53 AM  BMP  Glucose 70 - 99 mg/dL 409  811  914   BUN 8 - 23 mg/dL 14  19  19    Creatinine 0.44 - 1.00 mg/dL 7.82  9.56  2.13   Sodium 135 - 145 mmol/L 137  135  135   Potassium 3.5 - 5.1 mmol/L 4.0  3.8  4.1   Chloride 98 - 111 mmol/L 106  103  101   CO2 22 - 32 mmol/L 23  26  25    Calcium 8.9 - 10.3 mg/dL 8.3  8.1  8.9     LFT    Latest Ref Rng & Units 05/10/2023    9:22 AM 05/09/2023    2:40 PM 04/07/2023   11:53 AM  Hepatic Function  Total Protein 6.5 - 8.1 g/dL 6.5  6.7  6.8   Albumin 3.5 - 5.0 g/dL 3.2  3.7  3.5   AST 15 - 41 U/L 225  352  53   ALT 0 - 44 U/L 245  204  101   Alk Phosphatase 38 - 126 U/L 150  189  178   Total Bilirubin 0.0 - 1.2 mg/dL 4.1  1.9  1.2   Bilirubin, Direct 0.0 - 0.2 mg/dL 2.7        STUDIES: MR ABDOMEN MRCP WO CONTRAST Result Date: 05/10/2023 CLINICAL DATA:  Cholelithiasis.  Abdominal pain. EXAM: MRI ABDOMEN WITHOUT CONTRAST  (INCLUDING MRCP) TECHNIQUE: Multiplanar multisequence MR imaging of the abdomen was  performed. Heavily T2-weighted images of the biliary and pancreatic ducts were obtained, and three-dimensional MRCP images were rendered by post processing. COMPARISON:  CT and ultrasound exams from earlier same day FINDINGS: Lower chest: No acute findings. Hepatobiliary: No intrahepatic biliary duct dilatation. Common bile duct measures 6 mm diameter, upper normal. MRCP imaging demonstrates abrupt termination of the common bile duct at the ampulla on some series (see image 4 of series 18 and image 10 of series 17) which raises concern for an impacted stone at the ampulla although this could represent mass-effect on the duct by the ampulla itself. No other biliary duct stones evident. Gallbladder shows numerous tiny layering 1-2 mm stones without appreciable gallbladder wall thickening. While there is no substantial pericholecystic edema or fluid, a trace amount of fluid between the liver capsule and the gallbladder wall is not excluded. Pancreas: No focal mass lesion. No dilatation of the main duct. No intraparenchymal cyst. No peripancreatic edema. Spleen:  No splenomegaly. No suspicious focal mass lesion. Adrenals/Urinary Tract: No adrenal nodule or mass. Central sinus cysts are noted in both kidneys. Tiny cortical T2 hyperintensities in both kidneys are too small to characterize but statistically most likely benign and also compatible with cyst. No followup imaging is recommended. Stomach/Bowel: Stomach is decompressed. Duodenum is normally positioned as is the ligament of Treitz. No small bowel or colonic dilatation within the visualized abdomen. Vascular/Lymphatic: No abdominal aortic aneurysm no abdominal lymphadenopathy Other:  No intraperitoneal free fluid. Musculoskeletal: No suspicious marrow signal abnormality IMPRESSION: 1. Cholelithiasis with numerous 1-2 mm tiny stones in the lumen of the gallbladder. No overt gallbladder wall thickening, but some trace fluid between the gallbladder in the liver  capsule cannot be excluded. 2. No intrahepatic biliary duct dilatation. Common bile duct measures 6 mm diameter, upper normal with relatively abrupt termination at the ampulla. The common bile duct has a concave terminus, bulging into the duct, potentially from mass effect of the ampulla but impacted stone at the ampulla could also have this appearance. ERCP could be used to further characterize, as clinically warranted. 3. No other acute findings in the abdomen. Electronically Signed   By: Kennith Center M.D.   On: 05/10/2023 05:26   MR 3D Recon At Scanner Result Date: 05/10/2023 CLINICAL DATA:  Cholelithiasis.  Abdominal pain. EXAM: MRI ABDOMEN WITHOUT CONTRAST  (INCLUDING MRCP) TECHNIQUE: Multiplanar multisequence MR imaging of the abdomen was performed. Heavily T2-weighted images of the biliary and pancreatic ducts were obtained, and three-dimensional MRCP images were rendered by post processing. COMPARISON:  CT and ultrasound exams from earlier  same day FINDINGS: Lower chest: No acute findings. Hepatobiliary: No intrahepatic biliary duct dilatation. Common bile duct measures 6 mm diameter, upper normal. MRCP imaging demonstrates abrupt termination of the common bile duct at the ampulla on some series (see image 4 of series 18 and image 10 of series 17) which raises concern for an impacted stone at the ampulla although this could represent mass-effect on the duct by the ampulla itself. No other biliary duct stones evident. Gallbladder shows numerous tiny layering 1-2 mm stones without appreciable gallbladder wall thickening. While there is no substantial pericholecystic edema or fluid, a trace amount of fluid between the liver capsule and the gallbladder wall is not excluded. Pancreas: No focal mass lesion. No dilatation of the main duct. No intraparenchymal cyst. No peripancreatic edema. Spleen:  No splenomegaly. No suspicious focal mass lesion. Adrenals/Urinary Tract: No adrenal nodule or mass. Central sinus  cysts are noted in both kidneys. Tiny cortical T2 hyperintensities in both kidneys are too small to characterize but statistically most likely benign and also compatible with cyst. No followup imaging is recommended. Stomach/Bowel: Stomach is decompressed. Duodenum is normally positioned as is the ligament of Treitz. No small bowel or colonic dilatation within the visualized abdomen. Vascular/Lymphatic: No abdominal aortic aneurysm no abdominal lymphadenopathy Other:  No intraperitoneal free fluid. Musculoskeletal: No suspicious marrow signal abnormality IMPRESSION: 1. Cholelithiasis with numerous 1-2 mm tiny stones in the lumen of the gallbladder. No overt gallbladder wall thickening, but some trace fluid between the gallbladder in the liver capsule cannot be excluded. 2. No intrahepatic biliary duct dilatation. Common bile duct measures 6 mm diameter, upper normal with relatively abrupt termination at the ampulla. The common bile duct has a concave terminus, bulging into the duct, potentially from mass effect of the ampulla but impacted stone at the ampulla could also have this appearance. ERCP could be used to further characterize, as clinically warranted. 3. No other acute findings in the abdomen. Electronically Signed   By: Kennith Center M.D.   On: 05/10/2023 05:26   CT ABDOMEN PELVIS W CONTRAST Result Date: 05/09/2023 CLINICAL DATA:  Right-sided abdominal pain. Nausea and vomiting. Cholelithiasis. EXAM: CT ABDOMEN AND PELVIS WITH CONTRAST TECHNIQUE: Multidetector CT imaging of the abdomen and pelvis was performed using the standard protocol following bolus administration of intravenous contrast. RADIATION DOSE REDUCTION: This exam was performed according to the departmental dose-optimization program which includes automated exposure control, adjustment of the mA and/or kV according to patient size and/or use of iterative reconstruction technique. CONTRAST:  OMNIPAQUE IOHEXOL 300 MG/ML  SOLN  COMPARISON:  Ultrasound 05/09/2023 and CT scan 04/07/2023 FINDINGS: Lower chest: Left anterior descending coronary artery atherosclerosis. Mitral valve calcification. Bibasilar tree-in-bud reticulonodular opacities compatible with atypical infectious bronchiolitis. These are most concentrated in the right middle lobe. Small type 1 hiatal hernia. Hepatobiliary: Nodularity of the hepatic contour favoring mild cirrhosis morphology. Borderline gallbladder wall thickening. Small gallstones noted in the gallbladder. Common bile duct caliber 7 mm in diameter, within normal limits for age. Pancreas: Unremarkable Spleen: Unremarkable Adrenals/Urinary Tract: Bilateral peripelvic renal cysts are benign. No further imaging workup of these lesions is indicated. Otherwise unremarkable. Stomach/Bowel: Prominent stool throughout the colon favors constipation. Normal appendix. No dilated bowel. Vascular/Lymphatic: Atherosclerosis is present, including aortoiliac atherosclerotic disease. Reproductive: Left eccentric subserosal uterine fibroid unchanged from previous. Other: No supplemental non-categorized findings. Musculoskeletal: Grade 1 degenerative anterolisthesis at L5-S1. Lumbar spondylosis and degenerative disc disease contribute to impingement at L3-4, L4-5, and L5-S1. This impingement is most striking  at L3-4. IMPRESSION: 1. Cholelithiasis with borderline gallbladder wall thickening. Correlate clinically in assessing for acute cholecystitis. No biliary dilatation. 2. Prominent stool throughout the colon favors constipation. 3. Bibasilar tree-in-bud reticulonodular opacities compatible with atypical infectious bronchiolitis. These are most concentrated in the right middle lobe. 4. Small type 1 hiatal hernia. 5. Nodularity of the hepatic contour favoring mild cirrhosis morphology. 6. Lumbar impingement at L3-4, L4-5, and L5-S1. 7. Left anterior descending coronary artery atherosclerosis. Aortic Atherosclerosis (ICD10-I70.0).  8. Mitral valve calcification. Electronically Signed   By: Gaylyn Rong M.D.   On: 05/09/2023 20:28   US ABDOMEN LIMITED RUQ (LIVER/GB) Result Date: 05/09/2023 CLINICAL DATA:  Abdominal pain. EXAM: ULTRASOUND ABDOMEN LIMITED RIGHT UPPER QUADRANT COMPARISON:  CT 04/07/2023 FINDINGS: Gallbladder: Cholelithiasis with small stones layering in the gallbladder. Borderline gallbladder wall thickening at 4 mm. No pericholecystic edema. Murphy's sign is negative. Common bile duct: Diameter: 5 mm, normal Liver: Normal parenchymal echotexture. Mild nodularity to the liver contour, possibly indicating early cirrhosis. No focal lesions identified. Portal vein is patent on color Doppler imaging with normal direction of blood flow towards the liver. Other: None. IMPRESSION: 1. Cholelithiasis with mild gallbladder wall thickening. Murphy's sign is negative. 2. 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 Electronically Signed   By: Burman Nieves M.D.   On: 05/09/2023 19:14      Impression / Plan:   Dawn [name] is a 83 y.o. female with history of hypertension, CHF is admitted with 2 days history of right upper quadrant pain, nausea and vomiting, found to have cholelithiasis and probable choledocholithiasis and ascending cholangitis based on labs and imaging findings  Probable choledocholithiasis with ascending cholangitis Patient is hemodynamically stable Recommended to start Zosyn right away due to significant leukocytosis along with Flagyl Blood cultures no growth to date, lactate normalized Okay with clear liquid diet today as tolerated N.p.o. effective midnight with tentative plan for ERCP tomorrow by Dr. Servando Snare General Surgery is on board to evaluate for cholecystectomy as a definitive treatment  I have discussed alternative options, risks & benefits,  which include, but are not limited to, bleeding, infection, perforation,respiratory complication & drug reaction.  The patient  agrees with this plan & written consent will be obtained.    Thank you for involving me in the care of this patient.  Dr. Servando Snare will cover from tomorrow    LOS: 0 days   Lannette Donath, MD  05/10/2023, 3:54 PM    Note: This dictation was prepared with Dragon dictation along with smaller phrase technology. Any transcriptional errors that result from this process are unintentional.

## 2023-05-10 NOTE — ED Notes (Signed)
 Answered call light, pt states she needs to use the bathroom but does not want to use it in the room she said bc it may stink. Escorted pt to the bathroom across the hall and advised to ring the call bell once she is done.

## 2023-05-10 NOTE — ED Notes (Signed)
 Advised nurse that patient has ready bed

## 2023-05-10 NOTE — ED Notes (Signed)
 Assisted pt back to her room and hooked her back up on monitors. Pt states she is thirsty, advised currently NPO. Provided ice chips.

## 2023-05-10 NOTE — ED Provider Notes (Signed)
 8:37 AM Assumed care for off going team.   Blood pressure (!) 126/51, pulse 82, temperature 97.9 F (36.6 C), temperature source Oral, resp. rate 20, SpO2 92%.  See their HPI for full report but in brief pending transfer  I discussed the MRCP with Dr. Allegra Lai who did state that Dr. Servando Snare would be here tomorrow.  Given patient's been clinically stable and very well-appearing it would be okay to admit patient here to see if patient needed ERCP given questionable MRCP and have her gallbladder taken out.  Surgical team was already discussed with Dr. Fanny Bien according to notes.  I did send him a message to update him on this as well Dr Aleen Campi. Discussed with hospital for admission.       Concha Se, MD 05/10/23 [phone]

## 2023-05-10 NOTE — H&P (Signed)
 History and Physical    Dawn [name] WUJ:[id number] DOB: 03-14-1940 DOA: 05/09/2023  PCP: [facility] (Confirm with patient/family/NH records and if not entered, this has to be entered at [facility] point of entry) Patient coming from: home  I have personally briefly reviewed patient's old medical records in [facility]  Chief Complaint: RUQ pain, nausea  HPI: Dawn [name] is a 83 y.o. female with medical history significant of HTN, IIDM, SLE, rheumatoid arthritis, anxiety/depression/PTSD, presented with worsening of RUQ pain nauseous vomiting and fever.  Symptoms started 2 days ago, patient started to have intermittent RUQ abdominal pain associate with nausea vomiting of stomach content nonbloody none bile.  Pain worsened with taking meals, denied any diarrhea or dysuria.  Has had subjective fever and chills.  No history of gallstone.  Denied any chest pain no shortness of breath or cough. ED Course: Afebrile, blood pressure: Low borderline tachycardia nonhypoxic.  Blood work showed hemoglobin 13.7, WBC 17.6, normal BUN and creatinine, AST 352, ALT 204, total bilirubin trending 1.9> 4.2 this morning.  CT abdomen pelvis showed cholelithiasis with borderline gallbladder wall thickening, MRCP common bile duct measuring 6 mm upper borderline with concave tenderness bulging into the duct potentially from mass effect of the ampulla but impacted stone and ampulla cannot be ruled out.  Radiology recommended ERCP.  Patient was given ceftriaxone and Flagyl x 1 in the ED.  Review of Systems: As per HPI otherwise 14 point review of systems negative.    Past Medical History:  Diagnosis Date   CHF (congestive heart failure) (HCC)    Colitis, ischemic (HCC)    Fibromyalgia    Hypertension    Leaky heart valve    Lupus    Osteoarthritis    PTSD (post-traumatic stress disorder)    Rheumatoid arthritis (HCC)     Past Surgical History:  Procedure Laterality Date   TONSILLECTOMY        reports that she has never smoked. She does not have any smokeless tobacco history on file. She reports that she does not currently use drugs. She reports that she does not drink alcohol.  Allergies  Allergen Reactions   Doxycycline Nausea And Vomiting   Nsaids Other (See Comments)    GI Bleed   Penicillin G Swelling   Sulfa Antibiotics Nausea And Vomiting    Family History  Problem Relation Age of Onset   Cancer Mother    Alzheimer's disease Mother    Cancer Father        prostate   ADD / ADHD Son      Prior to Admission medications   Medication Sig Start Date End Date Taking? Authorizing Provider  acetaminophen (TYLENOL) 650 MG CR tablet Take 650 mg by mouth every 8 (eight) hours as needed for pain.    [provider]  amLODipine (NORVASC) 5 MG tablet Take 5 mg by mouth daily. 12/22/22   [provider]  ARIPiprazole (ABILIFY) 5 MG tablet Take 1 tablet (5 mg total) by mouth daily. 11/25/21   Margarita Mail, DO  Biotin 2.5 MG CAPS Take 10 capsules by mouth 3 (three) times daily. 08/22/22   [provider]  busPIRone (BUSPAR) 15 MG tablet Take 15 mg by mouth 3 (three) times daily. 12/22/22   [provider]  cetirizine (ZYRTEC) 10 MG tablet Take 10 mg by mouth daily.    [provider]  ciprofloxacin (CIPRO) 500 MG tablet Take 500 mg by mouth 2 (two) times daily. 12/22/22  [provider]  gabapentin (NEURONTIN) 100 MG capsule Take 100 mg by mouth 3 (three) times daily. 12/22/22   [provider]  hydrALAZINE (APRESOLINE) 10 MG tablet Take 10 mg by mouth 3 (three) times daily.    [provider]  hydrOXYzine (ATARAX) 10 MG tablet Take 1 tablet (10 mg total) by mouth daily as needed for anxiety. 11/25/21   Margarita Mail, DO  losartan (COZAAR) 50 MG tablet Take 1 tablet (50 mg total) by mouth daily. 04/07/23 04/06/24  Merwyn Katos, MD  metFORMIN (GLUCOPHAGE) 500 MG tablet Take 1 tablet (500 mg total) by mouth 2  (two) times daily with a meal. 09/03/21   Margarita Mail, DO  metFORMIN (GLUCOPHAGE) 850 MG tablet Take 850 mg by mouth 2 (two) times daily. 12/24/22   [provider]  NYSTATIN powder Apply 1 Application topically 2 (two) times daily.    [provider]  pantoprazole (PROTONIX) 40 MG tablet Take 1 tablet (40 mg total) by mouth daily. 11/25/21   Margarita Mail, DO  venlafaxine XR (EFFEXOR XR) 150 MG 24 hr capsule Take 150 mg by mouth every morning. 10/08/21   Margarita Mail, DO  venlafaxine XR (EFFEXOR XR) 75 MG 24 hr capsule Take 75 mg in every evening. 10/08/21   Margarita Mail, DO    Physical Exam: Vitals:   05/10/23 0330 05/10/23 0630 05/10/23 0700 05/10/23 0930  BP: (!) 146/72 (!) 126/51 136/78 (!) 163/93  Pulse: 79 82 80 80  Resp: 19 20 15 18   Temp:      TempSrc:      SpO2: 94% 92% 97% 97%    Constitutional: NAD, calm, comfortable Vitals:   05/10/23 0330 05/10/23 0630 05/10/23 0700 05/10/23 0930  BP: (!) 146/72 (!) 126/51 136/78 (!) 163/93  Pulse: 79 82 80 80  Resp: 19 20 15 18   Temp:      TempSrc:      SpO2: 94% 92% 97% 97%   Eyes: PERRL, lids and conjunctivae normal ENMT: Mucous membranes are moist. Posterior pharynx clear of any exudate or lesions.Normal dentition.  Neck: normal, supple, no masses, no thyromegaly Respiratory: clear to auscultation bilaterally, no wheezing, no crackles. Normal respiratory effort. No accessory muscle use.  Cardiovascular: Regular rate and rhythm, no murmurs / rubs / gallops. No extremity edema. 2+ pedal pulses. No carotid bruits.  Abdomen: Mild tenderness on deep palpation RUQ, no rebound no guarding, no masses palpated. No hepatosplenomegaly. Bowel sounds positive.  Musculoskeletal: no clubbing / cyanosis. No joint deformity upper and lower extremities. Good ROM, no contractures. Normal muscle tone.  Skin: no rashes, lesions, ulcers. No induration Neurologic: CN 2-12 grossly intact. Sensation intact, DTR  normal. Strength 5/5 in all 4.  Psychiatric: Normal judgment and insight. Alert and oriented x 3. Normal mood.    Labs on Admission: I have personally reviewed following labs and imaging studies  CBC: Recent Labs  Lab 05/09/23 1440 05/10/23 0922  WBC 17.6* 17.9*  NEUTROABS  --  15.3*  HGB 13.7 12.4  HCT 41.7 37.0  MCV 97.4 95.4  PLT 225 219   Basic Metabolic Panel: Recent Labs  Lab 05/09/23 1440 05/10/23 0922  NA 135 137  K 3.8 4.0  CL 103 106  CO2 26 23  GLUCOSE 170* 138*  BUN 19 14  CREATININE 0.77 0.70  CALCIUM 8.1* 8.3*   GFR: CrCl cannot be calculated (Unknown ideal weight.). Liver Function Tests: Recent Labs  Lab 05/09/23 1440 05/10/23 0922  AST 352* 225*  ALT 204* 245*  ALKPHOS 189* 150*  BILITOT 1.9* 4.1*  PROT 6.7 6.5  ALBUMIN 3.7 3.2*   Recent Labs  Lab 05/09/23 1440  LIPASE 21   No results for input(s): "AMMONIA" in the last 168 hours. Coagulation Profile: No results for input(s): "INR", "PROTIME" in the last 168 hours. Cardiac Enzymes: No results for input(s): "CKTOTAL", "CKMB", "CKMBINDEX", "TROPONINI" in the last 168 hours. BNP (last 3 results) No results for input(s): "PROBNP" in the last 8760 hours. HbA1C: No results for input(s): "HGBA1C" in the last 72 hours. CBG: No results for input(s): "GLUCAP" in the last 168 hours. Lipid Profile: No results for input(s): "CHOL", "HDL", "LDLCALC", "TRIG", "CHOLHDL", "LDLDIRECT" in the last 72 hours. Thyroid Function Tests: No results for input(s): "TSH", "T4TOTAL", "FREET4", "T3FREE", "THYROIDAB" in the last 72 hours. Anemia Panel: No results for input(s): "VITAMINB12", "FOLATE", "FERRITIN", "TIBC", "IRON", "RETICCTPCT" in the last 72 hours. Urine analysis:    Component Value Date/Time   COLORURINE YELLOW (A) 05/09/2023 1744   APPEARANCEUR CLEAR (A) 05/09/2023 1744   APPEARANCEUR Cloudy 11/11/2011 2245   LABSPEC 1.017 05/09/2023 1744   LABSPEC 1.017 11/11/2011 2245   PHURINE 6.0  05/09/2023 1744   GLUCOSEU NEGATIVE 05/09/2023 1744   GLUCOSEU Negative 11/11/2011 2245   HGBUR NEGATIVE 05/09/2023 1744   BILIRUBINUR NEGATIVE 05/09/2023 1744   BILIRUBINUR neg 11/25/2021 1616   BILIRUBINUR Negative 11/11/2011 2245   KETONESUR NEGATIVE 05/09/2023 1744   PROTEINUR NEGATIVE 05/09/2023 1744   UROBILINOGEN 0.2 11/25/2021 1616   NITRITE NEGATIVE 05/09/2023 1744   LEUKOCYTESUR NEGATIVE 05/09/2023 1744   LEUKOCYTESUR 2+ 11/11/2011 2245    Radiological Exams on Admission: MR ABDOMEN MRCP WO CONTRAST Result Date: 05/10/2023 CLINICAL DATA:  Cholelithiasis.  Abdominal pain. EXAM: MRI ABDOMEN WITHOUT CONTRAST  (INCLUDING MRCP) TECHNIQUE: Multiplanar multisequence MR imaging of the abdomen was performed. Heavily T2-weighted images of the biliary and pancreatic ducts were obtained, and three-dimensional MRCP images were rendered by post processing. COMPARISON:  CT and ultrasound exams from earlier same day FINDINGS: Lower chest: No acute findings. Hepatobiliary: No intrahepatic biliary duct dilatation. Common bile duct measures 6 mm diameter, upper normal. MRCP imaging demonstrates abrupt termination of the common bile duct at the ampulla on some series (see image 4 of series 18 and image 10 of series 17) which raises concern for an impacted stone at the ampulla although this could represent mass-effect on the duct by the ampulla itself. No other biliary duct stones evident. Gallbladder shows numerous tiny layering 1-2 mm stones without appreciable gallbladder wall thickening. While there is no substantial pericholecystic edema or fluid, a trace amount of fluid between the liver capsule and the gallbladder wall is not excluded. Pancreas: No focal mass lesion. No dilatation of the main duct. No intraparenchymal cyst. No peripancreatic edema. Spleen:  No splenomegaly. No suspicious focal mass lesion. Adrenals/Urinary Tract: No adrenal nodule or mass. Central sinus cysts are noted in both kidneys.  Tiny cortical T2 hyperintensities in both kidneys are too small to characterize but statistically most likely benign and also compatible with cyst. No followup imaging is recommended. Stomach/Bowel: Stomach is decompressed. Duodenum is normally positioned as is the ligament of Treitz. No small bowel or colonic dilatation within the visualized abdomen. Vascular/Lymphatic: No abdominal aortic aneurysm no abdominal lymphadenopathy Other:  No intraperitoneal free fluid. Musculoskeletal: No suspicious marrow signal abnormality IMPRESSION: 1. Cholelithiasis with numerous 1-2 mm tiny stones in the lumen of the gallbladder. No overt gallbladder wall thickening, but some trace fluid between the  gallbladder in the liver capsule cannot be excluded. 2. No intrahepatic biliary duct dilatation. Common bile duct measures 6 mm diameter, upper normal with relatively abrupt termination at the ampulla. The common bile duct has a concave terminus, bulging into the duct, potentially from mass effect of the ampulla but impacted stone at the ampulla could also have this appearance. ERCP could be used to further characterize, as clinically warranted. 3. No other acute findings in the abdomen. Electronically Signed   By: Kennith Center M.D.   On: 05/10/2023 05:26   MR 3D Recon At Scanner Result Date: 05/10/2023 CLINICAL DATA:  Cholelithiasis.  Abdominal pain. EXAM: MRI ABDOMEN WITHOUT CONTRAST  (INCLUDING MRCP) TECHNIQUE: Multiplanar multisequence MR imaging of the abdomen was performed. Heavily T2-weighted images of the biliary and pancreatic ducts were obtained, and three-dimensional MRCP images were rendered by post processing. COMPARISON:  CT and ultrasound exams from earlier same day FINDINGS: Lower chest: No acute findings. Hepatobiliary: No intrahepatic biliary duct dilatation. Common bile duct measures 6 mm diameter, upper normal. MRCP imaging demonstrates abrupt termination of the common bile duct at the ampulla on some series  (see image 4 of series 18 and image 10 of series 17) which raises concern for an impacted stone at the ampulla although this could represent mass-effect on the duct by the ampulla itself. No other biliary duct stones evident. Gallbladder shows numerous tiny layering 1-2 mm stones without appreciable gallbladder wall thickening. While there is no substantial pericholecystic edema or fluid, a trace amount of fluid between the liver capsule and the gallbladder wall is not excluded. Pancreas: No focal mass lesion. No dilatation of the main duct. No intraparenchymal cyst. No peripancreatic edema. Spleen:  No splenomegaly. No suspicious focal mass lesion. Adrenals/Urinary Tract: No adrenal nodule or mass. Central sinus cysts are noted in both kidneys. Tiny cortical T2 hyperintensities in both kidneys are too small to characterize but statistically most likely benign and also compatible with cyst. No followup imaging is recommended. Stomach/Bowel: Stomach is decompressed. Duodenum is normally positioned as is the ligament of Treitz. No small bowel or colonic dilatation within the visualized abdomen. Vascular/Lymphatic: No abdominal aortic aneurysm no abdominal lymphadenopathy Other:  No intraperitoneal free fluid. Musculoskeletal: No suspicious marrow signal abnormality IMPRESSION: 1. Cholelithiasis with numerous 1-2 mm tiny stones in the lumen of the gallbladder. No overt gallbladder wall thickening, but some trace fluid between the gallbladder in the liver capsule cannot be excluded. 2. No intrahepatic biliary duct dilatation. Common bile duct measures 6 mm diameter, upper normal with relatively abrupt termination at the ampulla. The common bile duct has a concave terminus, bulging into the duct, potentially from mass effect of the ampulla but impacted stone at the ampulla could also have this appearance. ERCP could be used to further characterize, as clinically warranted. 3. No other acute findings in the abdomen.  Electronically Signed   By: Kennith Center M.D.   On: 05/10/2023 05:26   CT ABDOMEN PELVIS W CONTRAST Result Date: 05/09/2023 CLINICAL DATA:  Right-sided abdominal pain. Nausea and vomiting. Cholelithiasis. EXAM: CT ABDOMEN AND PELVIS WITH CONTRAST TECHNIQUE: Multidetector CT imaging of the abdomen and pelvis was performed using the standard protocol following bolus administration of intravenous contrast. RADIATION DOSE REDUCTION: This exam was performed according to the departmental dose-optimization program which includes automated exposure control, adjustment of the mA and/or kV according to patient size and/or use of iterative reconstruction technique. CONTRAST:  OMNIPAQUE IOHEXOL 300 MG/ML  SOLN COMPARISON:  Ultrasound 05/09/2023 and  CT scan 04/07/2023 FINDINGS: Lower chest: Left anterior descending coronary artery atherosclerosis. Mitral valve calcification. Bibasilar tree-in-bud reticulonodular opacities compatible with atypical infectious bronchiolitis. These are most concentrated in the right middle lobe. Small type 1 hiatal hernia. Hepatobiliary: Nodularity of the hepatic contour favoring mild cirrhosis morphology. Borderline gallbladder wall thickening. Small gallstones noted in the gallbladder. Common bile duct caliber 7 mm in diameter, within normal limits for age. Pancreas: Unremarkable Spleen: Unremarkable Adrenals/Urinary Tract: Bilateral peripelvic renal cysts are benign. No further imaging workup of these lesions is indicated. Otherwise unremarkable. Stomach/Bowel: Prominent stool throughout the colon favors constipation. Normal appendix. No dilated bowel. Vascular/Lymphatic: Atherosclerosis is present, including aortoiliac atherosclerotic disease. Reproductive: Left eccentric subserosal uterine fibroid unchanged from previous. Other: No supplemental non-categorized findings. Musculoskeletal: Grade 1 degenerative anterolisthesis at L5-S1. Lumbar spondylosis and degenerative disc disease  contribute to impingement at L3-4, L4-5, and L5-S1. This impingement is most striking at L3-4. IMPRESSION: 1. Cholelithiasis with borderline gallbladder wall thickening. Correlate clinically in assessing for acute cholecystitis. No biliary dilatation. 2. Prominent stool throughout the colon favors constipation. 3. Bibasilar tree-in-bud reticulonodular opacities compatible with atypical infectious bronchiolitis. These are most concentrated in the right middle lobe. 4. Small type 1 hiatal hernia. 5. Nodularity of the hepatic contour favoring mild cirrhosis morphology. 6. Lumbar impingement at L3-4, L4-5, and L5-S1. 7. Left anterior descending coronary artery atherosclerosis. Aortic Atherosclerosis (ICD10-I70.0). 8. Mitral valve calcification. Electronically Signed   By: Gaylyn Rong M.D.   On: 05/09/2023 20:28   US ABDOMEN LIMITED RUQ (LIVER/GB) Result Date: 05/09/2023 CLINICAL DATA:  Abdominal pain. EXAM: ULTRASOUND ABDOMEN LIMITED RIGHT UPPER QUADRANT COMPARISON:  CT 04/07/2023 FINDINGS: Gallbladder: Cholelithiasis with small stones layering in the gallbladder. Borderline gallbladder wall thickening at 4 mm. No pericholecystic edema. Murphy's sign is negative. Common bile duct: Diameter: 5 mm, normal Liver: Normal parenchymal echotexture. Mild nodularity to the liver contour, possibly indicating early cirrhosis. No focal lesions identified. Portal vein is patent on color Doppler imaging with normal direction of blood flow towards the liver. Other: None. IMPRESSION: 1. Cholelithiasis with mild gallbladder wall thickening. Murphy's sign is negative. 2. 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 Electronically Signed   By: Burman Nieves M.D.   On: 05/09/2023 19:14    EKG: Ordered  Assessment/Plan Principal Problem:   Acute cholangitis Active Problems:   Ascending cholangitis  (please populate well all problems here in Problem List. (For example, if patient is on BP meds at  home and you resume or decide to hold them, it is a problem that needs to be her. Same for CAD, COPD, HLD and so on)  Probably impending ascending cholangitis -Given the septic fever at home with worsening of leukocytosis and uptrending bilirubin, will initiate antibiotics as per recommendation from GI.  GI will assemble ERCP for tomorrow -N.p.o. after midnight -Trend CBC and hepatic function -Given there is a history of penicillin allergy, pharmacy recommended ceftriaxone plus Flagyl regimen  Acute bilirubinemia Acute transaminitis -As above.  Symptomatic gallstone -Elective cholecystectomy.  General surgeon consulted.  Probably will wait after ERCP to decide surgical schedule.  IIDM -Hold off metformin -Start SSI   HTN -Hold off home BP meds -Start as needed hydralazine  DVT prophylaxis: Lovenox Code Status: Full code Family Communication: None at bedside Disposition Plan: Patient is sick with ascending cholangitis requiring IV antibiotics and inpatient ERCP as well as inpatient cholecystectomy, expect more than 2 midnight hospital stay Consults called: GI and general surgeon Admission status: Telemetry admission  Emeline General MD [facility] Pager [phone]

## 2023-05-11 ENCOUNTER — Encounter
Admission: EM | Disposition: A | Discharge status: Home Health | Payer: Self-pay | Source: Home / Self Care | Attending: Internal Medicine

## 2023-05-11 DIAGNOSIS — F03918 Unspecified dementia, unspecified severity, with other behavioral disturbance: Secondary | ICD-10-CM | POA: Insufficient documentation

## 2023-05-11 DIAGNOSIS — K8070 Calculus of gallbladder and bile duct without cholecystitis without obstruction: Secondary | ICD-10-CM | POA: Diagnosis not present

## 2023-05-11 DIAGNOSIS — A419 Sepsis, unspecified organism: Secondary | ICD-10-CM | POA: Insufficient documentation

## 2023-05-11 DIAGNOSIS — K8309 Other cholangitis: Secondary | ICD-10-CM | POA: Diagnosis not present

## 2023-05-11 LAB — GLUCOSE, CAPILLARY
Glucose-Capillary: 138 mg/dL — ABNORMAL HIGH (ref 70–99)
Glucose-Capillary: 123 mg/dL — ABNORMAL HIGH (ref 70–99)
Glucose-Capillary: 123 mg/dL — ABNORMAL HIGH (ref 70–99)
Glucose-Capillary: 157 mg/dL — ABNORMAL HIGH (ref 70–99)
Glucose-Capillary: 179 mg/dL — ABNORMAL HIGH (ref 70–99)
Glucose-Capillary: 187 mg/dL — ABNORMAL HIGH (ref 70–99)

## 2023-05-11 LAB — BLOOD CULTURE ID PANEL (REFLEXED) - BCID2

## 2023-05-11 LAB — CBC
HCT: 38.6 % (ref 36.0–46.0)
Hemoglobin: 13 g/dL (ref 12.0–15.0)
MCH: 31.3 pg (ref 26.0–34.0)
MCHC: 33.7 g/dL (ref 30.0–36.0)
MCV: 93 fL (ref 80.0–100.0)
Platelets: 227 10*3/uL (ref 150–400)
RBC: 4.15 MIL/uL (ref 3.87–5.11)
RDW: 12.9 % (ref 11.5–15.5)
WBC: 11.4 10*3/uL — ABNORMAL HIGH (ref 4.0–10.5)
nRBC: 0 % (ref 0.0–0.2)

## 2023-05-11 LAB — COMPREHENSIVE METABOLIC PANEL
ALT: 192 U/L — ABNORMAL HIGH (ref 0–44)
AST: 134 U/L — ABNORMAL HIGH (ref 15–41)
Albumin: 3.6 g/dL (ref 3.5–5.0)
Alkaline Phosphatase: 154 U/L — ABNORMAL HIGH (ref 38–126)
Anion gap: 9 (ref 5–15)
BUN: 9 mg/dL (ref 8–23)
CO2: 23 mmol/L (ref 22–32)
Calcium: 8.5 mg/dL — ABNORMAL LOW (ref 8.9–10.3)
Chloride: 105 mmol/L (ref 98–111)
Creatinine, Ser: 0.62 mg/dL (ref 0.44–1.00)
GFR, Estimated: >60 mL/min (ref >60)
Glucose, Bld: 147 mg/dL — ABNORMAL HIGH (ref 70–99)
Potassium: 4.2 mmol/L (ref 3.5–5.1)
Sodium: 137 mmol/L (ref 135–145)
Total Bilirubin: 3.3 mg/dL — ABNORMAL HIGH (ref 0.0–1.2)
Total Protein: 6.9 g/dL (ref 6.5–8.1)

## 2023-05-11 LAB — CULTURE, BLOOD (ROUTINE X 2)

## 2023-05-11 SURGERY — ERCP, WITH INTERVENTION IF INDICATED
Anesthesia: General

## 2023-05-11 MED ORDER — LORAZEPAM 0.5 MG PO TABS
0.5000 mg | ORAL_TABLET | Freq: Once | ORAL | Status: AC
  Administered 2023-05-11: 0.5 mg via ORAL
  Filled 2023-05-11: qty 1

## 2023-05-11 MED ORDER — LACTATED RINGERS IV SOLN: INTRAVENOUS | Status: DC

## 2023-05-11 MED ORDER — SODIUM CHLORIDE 0.9 % IV SOLN
2.0000 g | Freq: Two times a day (BID) | INTRAVENOUS | Status: DC
  Administered 2023-05-11 (×2): 2 g via INTRAVENOUS
  Filled 2023-05-11 (×2): qty 12.5

## 2023-05-11 MED ORDER — MORPHINE SULFATE (PF) 2 MG/ML IV SOLN
2.0000 mg | INTRAVENOUS | Status: DC | PRN
  Administered 2023-05-11 – 2023-05-14 (×2): 2 mg via INTRAVENOUS
  Filled 2023-05-11 (×2): qty 1

## 2023-05-11 MED ORDER — SODIUM CHLORIDE 0.9 % IV SOLN
1.0000 g | Freq: Three times a day (TID) | INTRAVENOUS | Status: DC
  Administered 2023-05-11 – 2023-05-18 (×19): 1 g via INTRAVENOUS
  Filled 2023-05-11 (×21): qty 20

## 2023-05-11 MED ORDER — SODIUM CHLORIDE 0.9 % IV SOLN: 1.0000 g | Freq: Three times a day (TID) | INTRAVENOUS | Status: DC

## 2023-05-11 NOTE — Progress Notes (Signed)
 PHARMACY - PHYSICIAN COMMUNICATION CRITICAL VALUE ALERT - BLOOD CULTURE IDENTIFICATION (BCID)  Dawn [name] is an 83 y.o. female who presented to [facility] on 05/09/2023 with a chief complaint of RUQ pain nauseous vomiting and fever.    Assessment:   Severe sepsis secondary to ascending cholangitis. Choledocholithiasis with ascending cholangitis. 1 of 4 bottles (aerobic), BCID: E.coli w/ CTX-M resistance  Name of physician (or Provider) Contacted:   Mansy  Current antibiotics: Cefepime  Changes to prescribed antibiotics recommended:  Recommendations accepted by provider Change Cefepime to Meropenem    Results for orders placed or performed during the hospital encounter of 05/09/23  Blood Culture ID Panel (Reflexed) (Collected: 05/09/2023  8:15 PM)  Result Value Ref Range   Enterococcus faecalis NOT DETECTED NOT DETECTED   Enterococcus Faecium NOT DETECTED NOT DETECTED   Listeria monocytogenes NOT DETECTED NOT DETECTED   Staphylococcus species NOT DETECTED NOT DETECTED   Staphylococcus aureus (BCID) NOT DETECTED NOT DETECTED   Staphylococcus epidermidis NOT DETECTED NOT DETECTED   Staphylococcus lugdunensis NOT DETECTED NOT DETECTED   Streptococcus species NOT DETECTED NOT DETECTED   Streptococcus agalactiae NOT DETECTED NOT DETECTED   Streptococcus pneumoniae NOT DETECTED NOT DETECTED   Streptococcus pyogenes NOT DETECTED NOT DETECTED   A.calcoaceticus-baumannii NOT DETECTED NOT DETECTED   Bacteroides fragilis NOT DETECTED NOT DETECTED   Enterobacterales DETECTED (A) NOT DETECTED   Enterobacter cloacae complex NOT DETECTED NOT DETECTED   Escherichia coli DETECTED (A) NOT DETECTED   Klebsiella aerogenes NOT DETECTED NOT DETECTED   Klebsiella oxytoca NOT DETECTED NOT DETECTED   Klebsiella pneumoniae NOT DETECTED NOT DETECTED   Proteus species NOT DETECTED NOT DETECTED   Salmonella species NOT DETECTED NOT DETECTED   Serratia marcescens NOT DETECTED NOT DETECTED    Haemophilus influenzae NOT DETECTED NOT DETECTED   Neisseria meningitidis NOT DETECTED NOT DETECTED   Pseudomonas aeruginosa NOT DETECTED NOT DETECTED   Stenotrophomonas maltophilia NOT DETECTED NOT DETECTED   Candida albicans NOT DETECTED NOT DETECTED   Candida auris NOT DETECTED NOT DETECTED   Candida glabrata NOT DETECTED NOT DETECTED   Candida krusei NOT DETECTED NOT DETECTED   Candida parapsilosis NOT DETECTED NOT DETECTED   Candida tropicalis NOT DETECTED NOT DETECTED   Cryptococcus neoformans/gattii NOT DETECTED NOT DETECTED   CTX-M ESBL DETECTED (A) NOT DETECTED   Carbapenem resistance IMP NOT DETECTED NOT DETECTED   Carbapenem resistance KPC NOT DETECTED NOT DETECTED   Carbapenem resistance NDM NOT DETECTED NOT DETECTED   Carbapenem resist OXA 48 LIKE NOT DETECTED NOT DETECTED   Carbapenem resistance VIM NOT DETECTED NOT DETECTED    Bari Mantis PharmD Clinical Pharmacist 05/11/2023

## 2023-05-11 NOTE — Progress Notes (Signed)
 Pt walked out of room and stated she is not having her procedure today. States she wants to call her lawyer,. Informed pt she could tell the dr when he came in.

## 2023-05-11 NOTE — Hospital Course (Signed)
 Dawn [name] is a 83 y.o. female with medical history significant of HTN, IIDM, SLE, rheumatoid arthritis, anxiety/depression/PTSD, presented with worsening of RUQ pain nauseous vomiting and fever.  Upon arriving to hospital, patient had a heart rate of 111, white cell count of 17.9.  Lactic acid 2.8.  MRCP showed dilated common bile duct, evidence of ascending cholangitis.  General surgery and GI consulted, patient was also placed on antibiotics.

## 2023-05-11 NOTE — Plan of Care (Signed)

## 2023-05-11 NOTE — Progress Notes (Signed)
 PHARMACY NOTE:  ANTIMICROBIAL RENAL DOSAGE ADJUSTMENT  Current antimicrobial regimen includes a mismatch between antimicrobial dosage and estimated renal function.  As per policy approved by the Pharmacy & Therapeutics and Medical Executive Committees, the antimicrobial dosage will be adjusted accordingly.  Current antimicrobial dosage:  Cefepime 1g IV every 8 hours  Indication: cholangitis   Renal Function:  Estimated Creatinine Clearance: 59.1 mL/min (by C-G formula based on SCr of 0.62 mg/dL).     Antimicrobial dosage has been changed to:  Cefepime 2g IV every 12 hours  Additional comments:   Thank you for allowing pharmacy to be a part of this patient's care.  Gardner Candle, PharmD, BCPS Clinical Pharmacist 05/11/2023 7:56 AM

## 2023-05-11 NOTE — Progress Notes (Signed)
 [facility] SURGICAL PROGRESS NOTE (cpt [phone])  Hospital Day(s): 1.   Interval History: Patient seen and examined, no acute events or new complaints overnight. Patient reports she is feeling okay. She does not willingly participate in interview and discussion. She reports "she is not quite ready" to explain her reasoning to not want to participate nor undergo procedures. No fever.  Her leukocytosis continues to improve; WBC 11.4K (from 17.9K). Hgb to 13.0. Total bilirubin 3.3 (from 4.1). Plan for ERCP today, if patient willing.   Review of Systems:  Unable to reliably perform secondary to patient's cooperation/psychiatric conditions  Vital signs in last 24 hours: [min-max] current  Temp:  [97.8 F (36.6 C)-98.4 F (36.9 C)] 98 F (36.7 C) (03/24 0425) Pulse Rate:  [79-97] 88 (03/24 0425) Resp:  [13-20] 20 (03/24 0425) BP: (147-175)/(74-93) 171/74 (03/24 0425) SpO2:  [96 %-100 %] 96 % (03/24 0425)             Intake/Output last 2 shifts:  03/23 0701 - 03/24 0700 In: 655.6 [P.O.:360; IV Piggyback:295.6] Out: -    Physical Exam:  Constitutional: alert, cooperative and no distress  HENT: normocephalic without obvious abnormality  Eyes: PERRL, EOM's grossly intact and symmetric  Respiratory: breathing non-labored at rest  Gastrointestinal: Unable to assess secondary to patient's cooperation   Labs:     Latest Ref Rng & Units 05/11/2023    4:43 AM 05/10/2023    9:22 AM 05/09/2023    2:40 PM  CBC  WBC 4.0 - 10.5 K/uL 11.4  17.9  17.6   Hemoglobin 12.0 - 15.0 g/dL 21.3  08.6  57.8   Hematocrit 36.0 - 46.0 % 38.6  37.0  41.7   Platelets 150 - 400 K/uL 227  219  225       Latest Ref Rng & Units 05/11/2023    4:43 AM 05/10/2023    9:22 AM 05/09/2023    2:40 PM  CMP  Glucose 70 - 99 mg/dL 469  629  528   BUN 8 - 23 mg/dL 9  14  19    Creatinine 0.44 - 1.00 mg/dL 4.13  2.44  0.10   Sodium 135 - 145 mmol/L 137  137  135   Potassium 3.5 - 5.1 mmol/L 4.2  4.0  3.8    Chloride 98 - 111 mmol/L 105  106  103   CO2 22 - 32 mmol/L 23  23  26    Calcium 8.9 - 10.3 mg/dL 8.5  8.3  8.1   Total Protein 6.5 - 8.1 g/dL 6.9  6.5  6.7   Total Bilirubin 0.0 - 1.2 mg/dL 3.3  4.1  1.9   Alkaline Phos 38 - 126 U/L 154  150  189   AST 15 - 41 U/L 134  225  352   ALT 0 - 44 U/L 192  245  204      Imaging studies: No new pertinent imaging studies   Assessment/Plan:  83 y.o. female with possible choledocholithiasis and cholangitis, labs improving, complicated by patient participation/psychiatric history    - Appreciate GI assistance, plan for ERCP today. However, patient this morning is refusing this and states "she is not quite ready to explain why." Multiple attempts made at discussing the importance of this especially given her presentation and associated risk with forgoing this. She continued to state "she is not quite ready."    - Again, typically recommend cholecystectomy in this setting as well; however, patient refusing procedures.  -  Continue NPO for now as to not delay any possible procedure - Continue IV Abx (Cefepime/Flagyl) - Monitor abdominal examination - Monitor leukocytosis; improved - Monitor hyperbilirubinemia; improved   - Mobilize as tolerated  - Further management per primary service    All of the above findings and recommendations were discussed with the patient, and the medical team, and all of patient's questions were answered to her expressed satisfaction.  -- Lynden Oxford, PA-C [facility] 05/11/2023, 7:01 AM M-F: 7am - 4pm

## 2023-05-11 NOTE — TOC Initial Note (Addendum)
 Transition of Care [facility]) - Initial/Assessment Note    Patient Details  Name: Dawn [name] MRN: [id number] Date of Birth: 23-May-1940  [facility] CM/SW Contact:    Chapman Fitch, RN Phone Number: 05/11/2023, 9:02 AM  Clinical Narrative:                    Patient admitted from [facility] Plan for ERCP today Notified MD that when patient is ready for dc will have to coordinate with ALF Notified Tammy at [facility] that I am following the patient , she confirms patient is from Memory Care     Patient Goals and CMS Choice            Expected Discharge Plan and Services                                              Prior Living Arrangements/Services                       Activities of Daily Living   ADL Screening (condition at time of admission) Independently performs ADLs?: No Does the patient have a NEW difficulty with bathing/dressing/toileting/self-feeding that is expected to last >3 days?: No Does the patient have a NEW difficulty with getting in/out of bed, walking, or climbing stairs that is expected to last >3 days?: No Does the patient have a NEW difficulty with communication that is expected to last >3 days?: No Is the patient deaf or have difficulty hearing?: No Does the patient have difficulty seeing, even when wearing glasses/contacts?: No Does the patient have difficulty concentrating, remembering, or making decisions?: No  Permission Sought/Granted                  Emotional Assessment              Admission diagnosis:  Acute cholangitis [K83.09] Bronchitis [J40] RUQ pain [R10.11] Sepsis, due to unspecified organism, unspecified whether acute organ dysfunction present [facility]) [A41.9] Patient Active Problem List   Diagnosis Date Noted   Ascending cholangitis 05/10/2023   Acute cholangitis 05/10/2023   Cholelithiasis with choledocholithiasis 05/10/2023   Paranoia (HCC) 12/30/2022   Type 2 diabetes  mellitus with diabetic neuropathy, unspecified (HCC) 07/26/2021   Ambulatory dysfunction 07/26/2021   Fibromyalgia 06/03/2021   Melena 06/01/2021   Hypertension    History of ischemic colitis 2013    Hyperglycemia    Generalized anxiety disorder 04/16/2017   Major depressive disorder 04/16/2017   Panic disorder 04/16/2017   PTSD (post-traumatic stress disorder) 04/16/2017   DDD (degenerative disc disease), lumbar 07/17/2014   Lumbar facet arthropathy 07/17/2014   Lumbar radicular pain 07/17/2014   Sacroiliac joint dysfunction 07/17/2014   PCP:  [facility] Pharmacy:   [facility] [address], [place] - [address] [place] [address] Phone: [phone] Fax: [phone]  [facility] - [place], [place] - [address] [place] [address] Phone: [phone] Fax: [phone]     Social Drivers of Health (SDOH) Social History: SDOH Screenings   Food Insecurity: No Food Insecurity (05/10/2023)  Housing: Low Risk  (05/10/2023)  Transportation Needs: No Transportation Needs (05/10/2023)  Utilities: Not At Risk (05/10/2023)  Alcohol Screen: Low Risk  (07/22/2021)  Depression (PHQ2-9): Low Risk  (11/25/2021)  Recent Concern: Depression (  PHQ2-9) - High Risk (11/05/2021)  Social Connections: Socially Isolated (05/10/2023)  Stress: Stress Concern Present (07/22/2021)  Tobacco Use: Unknown (05/09/2023)   SDOH Interventions:     Readmission Risk Interventions     No data to display

## 2023-05-11 NOTE — Progress Notes (Signed)
  Progress Note   Patient: Dawn [name] WUJ:[id number] DOB: [birth year] DOA: 05/09/2023     1 DOS: the patient was seen and examined on 05/11/2023   Brief hospital course: Dawn [name] is a 83 y.o. female with medical history significant of HTN, IIDM, SLE, rheumatoid arthritis, anxiety/depression/PTSD, presented with worsening of RUQ pain nauseous vomiting and fever.  Upon arriving to hospital, patient had a heart rate of 111, white cell count of 17.9.  Lactic acid 2.8.  MRCP showed dilated common bile duct, evidence of ascending cholangitis.  General surgery and GI consulted, patient was also placed on antibiotics.   Principal Problem:   Acute cholangitis Active Problems:   Hypertension   PTSD (post-traumatic stress disorder)   Fibromyalgia   Type 2 diabetes mellitus with diabetic neuropathy, unspecified (HCC)   Paranoia (HCC)   Ascending cholangitis   Cholelithiasis with choledocholithiasis   Dementia with behavioral disturbance (HCC)   Severe sepsis (HCC)   Assessment and Plan: Severe sepsis secondary to ascending cholangitis. Choledocholithiasis with ascending cholangitis. Right knee hospital, patient had a heart rate of 117, white cell count of 17.9.  Lactic acid 2.8, met severe sepsis criteria. This is a secondary to ascending cholangitis which she has been demonstrated in MRCP. Blood cultures so far negative, lactic acid has normalized after fluids. Change antibiotics from Rocephin to cefepime.  Patient has allergy to penicillin. Patient will need ERCP, but the patient refused to give consent.  Discussion below.  Dementia with behavioral disturbance. Paranoia. Discussed with patient daughter-in-law, patient currently living in the memory unit with history of dementia.  She also has paranoia. Currently she refused to sign consent for ERCP.  Psychiatry consult has been obtained, patient most likely lacks capacity for medical decision making. Daughter-in-law we will talk with  siblings who were involved in her life, appoint appointment person to make decision for her.  TOC will follow-up on this.  Essential hypertension. Hold off blood pressure medicine patient has no risk of deterioration.  Type 2 diabetes. Continue sliding scale insulin.  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 Continue to follow.     Subjective:  Currently denies any fever or chills, no nausea vomiting or abdominal pain.  Physical Exam: Vitals:   05/10/23 1825 05/10/23 2023 05/11/23 0425 05/11/23 0751  BP:  (!) 148/88 (!) 171/74   Pulse:  81 88   Resp: 16 20 20    Temp:  98.3 F (36.8 C) 98 F (36.7 C)   TempSrc:  Oral    SpO2:  100% 96%   Weight:    90.4 kg   General exam: Appears calm and comfortable  Respiratory system: Clear to auscultation. Respiratory effort normal. Cardiovascular system: S1 & S2 heard, RRR. No JVD, murmurs, rubs, gallops or clicks. No pedal edema. Gastrointestinal system: Abdomen is nondistended, soft and nontender. No organomegaly or masses felt. Normal bowel sounds heard. Central nervous system: Alert and oriented x2. No focal neurological deficits. Extremities: Symmetric 5 x 5 power. Skin: No rashes, lesions or ulcers Psychiatry: Flat affect.   Data Reviewed:  Reviewed MRI scan results, lab results.  Discussed with general surgery.  Family Communication: Daughter-in-law updated, long discussion.  Disposition: Status is: Inpatient Remains inpatient appropriate because: Severity of disease, IV treatment.  Inpatient procedure.     Time spent: 55 minutes  Author: Marrion Coy, MD 05/11/2023 10:52 AM  For on call review [email or web address].

## 2023-05-12 ENCOUNTER — Inpatient Hospital Stay

## 2023-05-12 ENCOUNTER — Encounter: Payer: Self-pay | Admitting: Internal Medicine

## 2023-05-12 ENCOUNTER — Encounter
Admission: EM | Disposition: A | Discharge status: Home Health | Payer: Self-pay | Source: Home / Self Care | Attending: Internal Medicine

## 2023-05-12 ENCOUNTER — Inpatient Hospital Stay: Admitting: Anesthesiology

## 2023-05-12 ENCOUNTER — Other Ambulatory Visit: Payer: Self-pay

## 2023-05-12 DIAGNOSIS — K8309 Other cholangitis: Secondary | ICD-10-CM | POA: Diagnosis not present

## 2023-05-12 DIAGNOSIS — F4329 Adjustment disorder with other symptoms: Secondary | ICD-10-CM | POA: Diagnosis not present

## 2023-05-12 DIAGNOSIS — K8070 Calculus of gallbladder and bile duct without cholecystitis without obstruction: Secondary | ICD-10-CM | POA: Diagnosis not present

## 2023-05-12 DIAGNOSIS — R932 Abnormal findings on diagnostic imaging of liver and biliary tract: Secondary | ICD-10-CM

## 2023-05-12 DIAGNOSIS — F03918 Unspecified dementia, unspecified severity, with other behavioral disturbance: Secondary | ICD-10-CM | POA: Diagnosis not present

## 2023-05-12 DIAGNOSIS — Z8659 Personal history of other mental and behavioral disorders: Secondary | ICD-10-CM

## 2023-05-12 HISTORY — PX: ERCP: SHX5425

## 2023-05-12 LAB — COMPREHENSIVE METABOLIC PANEL
ALT: 120 U/L — ABNORMAL HIGH (ref 0–44)
AST: 61 U/L — ABNORMAL HIGH (ref 15–41)
Albumin: 3 g/dL — ABNORMAL LOW (ref 3.5–5.0)
Alkaline Phosphatase: 126 U/L (ref 38–126)
Anion gap: 8 (ref 5–15)
BUN: 11 mg/dL (ref 8–23)
CO2: 23 mmol/L (ref 22–32)
Calcium: 8.5 mg/dL — ABNORMAL LOW (ref 8.9–10.3)
Chloride: 106 mmol/L (ref 98–111)
Creatinine, Ser: 0.7 mg/dL (ref 0.44–1.00)
GFR, Estimated: >60 mL/min (ref >60)
Glucose, Bld: 131 mg/dL — ABNORMAL HIGH (ref 70–99)
Potassium: 3.9 mmol/L (ref 3.5–5.1)
Sodium: 137 mmol/L (ref 135–145)
Total Bilirubin: 1.4 mg/dL — ABNORMAL HIGH (ref 0.0–1.2)
Total Protein: 6.1 g/dL — ABNORMAL LOW (ref 6.5–8.1)

## 2023-05-12 LAB — GLUCOSE, CAPILLARY
Glucose-Capillary: 110 mg/dL — ABNORMAL HIGH (ref 70–99)
Glucose-Capillary: 98 mg/dL (ref 70–99)
Glucose-Capillary: 331 mg/dL — ABNORMAL HIGH (ref 70–99)
Glucose-Capillary: 87 mg/dL (ref 70–99)
Glucose-Capillary: 107 mg/dL — ABNORMAL HIGH (ref 70–99)
Glucose-Capillary: 119 mg/dL — ABNORMAL HIGH (ref 70–99)
Glucose-Capillary: 188 mg/dL — ABNORMAL HIGH (ref 70–99)

## 2023-05-12 LAB — MAGNESIUM: Magnesium: 2 mg/dL (ref 1.7–2.4)

## 2023-05-12 SURGERY — ERCP, WITH INTERVENTION IF INDICATED
Anesthesia: General

## 2023-05-12 MED ORDER — SODIUM CHLORIDE 0.9% FLUSH: 3.0000 mL | Freq: Two times a day (BID) | INTRAVENOUS | Status: DC

## 2023-05-12 MED ORDER — CLONAZEPAM 0.25 MG PO TBDP
0.5000 mg | ORAL_TABLET | Freq: Two times a day (BID) | ORAL | Status: DC | PRN
  Administered 2023-05-12 – 2023-05-17 (×4): 0.5 mg via ORAL
  Filled 2023-05-12 (×4): qty 2

## 2023-05-12 MED ORDER — SODIUM CHLORIDE 0.9% FLUSH: 3.0000 mL | INTRAVENOUS | Status: DC | PRN

## 2023-05-12 MED ORDER — LIDOCAINE HCL (PF) 2 % IJ SOLN
INTRAMUSCULAR | Status: DC | PRN
Start: 2023-05-12 — End: 2023-05-12
  Administered 2023-05-12: 40 mg via INTRADERMAL

## 2023-05-12 MED ORDER — DICLOFENAC SUPPOSITORY 100 MG
RECTAL | Status: DC | PRN
  Administered 2023-05-12: 100 mg via RECTAL

## 2023-05-12 MED ORDER — LACTATED RINGERS IV SOLN: INTRAVENOUS | Status: DC

## 2023-05-12 MED ORDER — DICLOFENAC SUPPOSITORY 100 MG
RECTAL | Status: AC
  Filled 2023-05-12: qty 1

## 2023-05-12 MED ORDER — PROPOFOL 500 MG/50ML IV EMUL
INTRAVENOUS | Status: DC | PRN
  Administered 2023-05-12: 110 ug/kg/min via INTRAVENOUS

## 2023-05-12 NOTE — Plan of Care (Signed)

## 2023-05-12 NOTE — Anesthesia Preprocedure Evaluation (Addendum)
 Anesthesia Evaluation  Patient identified by MRN, date of birth, ID band Patient awake    Reviewed: Allergy & Precautions, NPO status , Patient's Chart, lab work & pertinent test results  History of Anesthesia Complications Negative for: history of anesthetic complications  Airway Mallampati: I   Neck ROM: Full    Dental  (+) Missing   Pulmonary neg pulmonary ROS   Pulmonary exam normal breath sounds clear to auscultation       Cardiovascular hypertension, +CHF  Normal cardiovascular exam Rhythm:Regular Rate:Normal  ECG 04/12/23:  Sinus rhythm Low voltage, precordial leads Borderline repolarization abnormality Baseline wander in lead(s) V5 V6   Neuro/Psych  PSYCHIATRIC DISORDERS (PTSD; paranoia) Anxiety Depression   Dementia negative neurological ROS     GI/Hepatic ,GERD  ,,  Endo/Other  diabetes, Type 2  Obesity   Renal/GU negative Renal ROS     Musculoskeletal  (+) Arthritis , Osteoarthritis and Rheumatoid disorders,  Fibromyalgia -Lupus    Abdominal   Peds  Hematology negative hematology ROS (+)   Anesthesia Other Findings   Reproductive/Obstetrics                             Anesthesia Physical Anesthesia Plan  ASA: 3  Anesthesia Plan: General   Post-op Pain Management:    Induction: Intravenous  PONV Risk Score and Plan: 3 and Propofol infusion, TIVA and Treatment may vary due to age or medical condition  Airway Management Planned: Natural Airway  Additional Equipment:   Intra-op Plan:   Post-operative Plan:   Informed Consent: I have reviewed the patients History and Physical, chart, labs and discussed the procedure including the risks, benefits and alternatives for the proposed anesthesia with the patient or authorized representative who has indicated his/her understanding and acceptance.       Plan Discussed with: CRNA  Anesthesia Plan Comments: (LMA/GETA  backup discussed.  Patient consented for risks of anesthesia including but not limited to:  - adverse reactions to medications - damage to eyes, teeth, lips or other oral mucosa - nerve damage due to positioning  - sore throat or hoarseness - damage to heart, brain, nerves, lungs, other parts of body or loss of life  Informed patient about role of CRNA in peri- and intra-operative care.  Patient voiced understanding.)        Anesthesia Quick Evaluation

## 2023-05-12 NOTE — Progress Notes (Signed)
 [facility] SURGICAL PROGRESS NOTE (cpt [id number])  Hospital Day(s): 2.   Interval History: Patient seen and examined, no acute events or new complaints overnight. Patient reports she had some upper abdominal pain this morning; mild. No fever, chills, emesis. Total bilirubin down to 1.4 (from 3.3). Plan for ERCP today, patient is willing however this is pending psychiatric evaluation to determine competency. I did again discuss role for procedures and she states "I would like someone to call a lawyer, I have PTSD and want to be sure I am safe."   Review of Systems:  Unable to reliably perform secondary to patient's cooperation/psychiatric conditions  Vital signs in last 24 hours: [min-max] current  Temp:  [97.5 F (36.4 C)-98.5 F (36.9 C)] 97.5 F (36.4 C) (03/25 0208) Pulse Rate:  [72-77] 72 (03/25 0208) Resp:  [15-17] 17 (03/25 0208) BP: (125-139)/(64-80) 125/64 (03/25 0208) SpO2:  [95 %-97 %] 96 % (03/25 0208) Weight:  [90.4 kg] 90.4 kg (03/24 0751)       Weight: 90.4 kg     Intake/Output last 2 shifts:  03/24 0701 - 03/25 0700 In: 879.6 [P.O.:120; I.V.:359.6; IV Piggyback:400] Out: 550 [Urine:550]   Physical Exam:  Constitutional: alert, cooperative and no distress  HENT: normocephalic without obvious abnormality  Eyes: PERRL, EOM's grossly intact and symmetric  Respiratory: breathing non-labored at rest  Gastrointestinal: Abdomen is soft, non-tender, non-distended, no rebound/guarding. Murphy's Sign negative this AM   Labs:     Latest Ref Rng & Units 05/11/2023    4:43 AM 05/10/2023    9:22 AM 05/09/2023    2:40 PM  CBC  WBC 4.0 - 10.5 K/uL 11.4  17.9  17.6   Hemoglobin 12.0 - 15.0 g/dL 29.5  28.4  13.2   Hematocrit 36.0 - 46.0 % 38.6  37.0  41.7   Platelets 150 - 400 K/uL 227  219  225       Latest Ref Rng & Units 05/12/2023    4:14 AM 05/11/2023    4:43 AM 05/10/2023    9:22 AM  CMP  Glucose 70 - 99 mg/dL 440  102  725   BUN 8 - 23 mg/dL 11  9  14     Creatinine 0.44 - 1.00 mg/dL 3.66  4.40  3.47   Sodium 135 - 145 mmol/L 137  137  137   Potassium 3.5 - 5.1 mmol/L 3.9  4.2  4.0   Chloride 98 - 111 mmol/L 106  105  106   CO2 22 - 32 mmol/L 23  23  23    Calcium 8.9 - 10.3 mg/dL 8.5  8.5  8.3   Total Protein 6.5 - 8.1 g/dL 6.1  6.9  6.5   Total Bilirubin 0.0 - 1.2 mg/dL 1.4  3.3  4.1   Alkaline Phos 38 - 126 U/L 126  154  150   AST 15 - 41 U/L 61  134  225   ALT 0 - 44 U/L 120  192  245      Imaging studies: No new pertinent imaging studies   Assessment/Plan:  83 y.o. female with possible choledocholithiasis and cholangitis, labs improving, complicated by patient participation/psychiatric history    - Appreciate GI assistance, plan for ERCP today if patient willing and deemed competent by psychiatry   - Again, typically recommend cholecystectomy in this setting as well; however, patient continues to refuse this. She states today, "she wants someone to call a lawyer, she has PTSD and I want to  be sure I am safe."  - Continue NPO for now as to not delay any possible procedure - Continue IV Abx (Cefepime/Flagyl) - Monitor abdominal examination - Monitor hyperbilirubinemia; improved   - Mobilize as tolerated  - Further management per primary service    All of the above findings and recommendations were discussed with the patient, and the medical team, and all of patient's questions were answered to her expressed satisfaction.  -- Lynden Oxford, PA-C [facility] 05/12/2023, 7:17 AM M-F: 7am - 4pm
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
## 2023-05-12 NOTE — Op Note (Signed)
 [facility] Patient Name: Dawn [name] Procedure Date: 05/12/2023 12:55 PM MRN: [id number] Account #: [id number] Date of Birth: [birth year] Admit Type: Inpatient Age: 83 Room: [facility] ENDO ROOM 4 Gender: Female Note Status: Finalized Instrument Name: Nolon Stalls [id number] Procedure:             ERCP Indications:           Common bile duct stone(s), Suspected ascending                         cholangitis Providers:             Midge Minium MD, MD Referring MD:          No Local Md, MD (Referring MD) Medicines:             Propofol per Anesthesia Complications:         No immediate complications. Procedure:             Pre-Anesthesia Assessment:                        - Prior to the procedure, a History and Physical was                         performed, and patient medications and allergies were                         reviewed. The patient's tolerance of previous                         anesthesia was also reviewed. The risks and benefits                         of the procedure and the sedation options and risks                         were discussed with the patient. All questions were                         answered, and informed consent was obtained. Prior                         Anticoagulants: The patient has taken no anticoagulant                         or antiplatelet agents. ASA Grade Assessment: II - A                         patient with mild systemic disease. After reviewing                         the risks and benefits, the patient was deemed in                         satisfactory condition to undergo the procedure.                        After obtaining informed consent, the scope was passed  under direct vision. Throughout the procedure, the                         patient's blood pressure, pulse, and oxygen                         saturations were monitored continuously. The                         Duodenoscope  was introduced through the mouth, and                         used to inject contrast into and used to inject                         contrast into the bile duct. The ERCP was accomplished                         without difficulty. The patient tolerated the                         procedure well. Findings:      The scout film was normal. The esophagus was successfully intubated       under direct vision. The scope was advanced to a normal major papilla in       the descending duodenum without detailed examination of the pharynx,       larynx and associated structures, and upper GI tract. The upper GI tract       was grossly normal. The bile duct was deeply cannulated with the       short-nosed traction sphincterotome. Contrast was injected. I personally       interpreted the bile duct images. There was brisk flow of contrast       through the ducts. Image quality was excellent. Contrast extended to the       entire biliary tree. The lower third of the main bile duct contained       filling defect(s). A wire was passed into the biliary tree. A 6 mm       biliary sphincterotomy was made with a traction (standard)       sphincterotome using ERBE electrocautery. There was no       post-sphincterotomy bleeding. The biliary tree was swept with a 15 mm       balloon starting at the bifurcation. Sludge was swept from the duct. Impression:            - A filling defect was seen on the cholangiogram.                        - A biliary sphincterotomy was performed.                        - The biliary tree was swept and sludge was found. Recommendation:        - Return patient to hospital ward for ongoing care.                        - Clear liquid diet today.                        -  Continue present medications.                        - Watch for pancreatitis, bleeding, perforation, and                         cholangitis. Procedure Code(s):     --- Professional ---                        [phone],  Endoscopic retrograde cholangiopancreatography                         (ERCP); with removal of calculi/debris from                         biliary/pancreatic duct(s)                        43262, Endoscopic retrograde cholangiopancreatography                         (ERCP); with sphincterotomy/papillotomy                        [phone], Endoscopic catheterization of the biliary                         ductal system, radiological supervision and                         interpretation Diagnosis Code(s):     --- Professional ---                        K80.50, Calculus of bile duct without cholangitis or                         cholecystitis without obstruction                        R93.2, Abnormal findings on diagnostic imaging of                         liver and biliary tract CPT copyright 2022 American Medical Association. All rights reserved. The codes documented in this report are preliminary and upon coder review may  be revised to meet current compliance requirements. Midge Minium MD, MD 05/12/2023 1:16:12 PM This report has been signed electronically. Number of Addenda: 0 Note Initiated On: 05/12/2023 12:55 PM Estimated Blood Loss:  Estimated blood loss: none.      [facility]

## 2023-05-12 NOTE — Transfer of Care (Signed)
 Immediate Anesthesia Transfer of Care Note  Patient: Dawn [name]  Procedure(s) Performed: ERCP, WITH INTERVENTION IF INDICATED  Patient Location: PACU and Endoscopy Unit  Anesthesia Type:MAC  Level of Consciousness: drowsy  Airway & Oxygen Therapy: Patient Spontanous Breathing and Patient connected to nasal cannula oxygen  Post-op Assessment: Report given to RN and Post -op Vital signs reviewed and stable  Post vital signs: Reviewed and stable  Last Vitals:  Vitals Value Taken Time  BP 121/62 05/12/23 1320  Temp 35.9 C 05/12/23 1320  Pulse 70 05/12/23 1322  Resp 15 05/12/23 1322  SpO2 98 % 05/12/23 1322  Vitals shown include unfiled device data.  Last Pain:  Vitals:   05/12/23 1320  TempSrc: Temporal  PainSc: Asleep      Patients Stated Pain Goal: 0 (05/10/23 2000)  Complications: No notable events documented.

## 2023-05-12 NOTE — Inpatient Diabetes Management (Signed)
 Inpatient Diabetes Program Recommendations  AACE/ADA: New Consensus Statement on Inpatient Glycemic Control (2015)  Target Ranges:  Prepandial:   less than 140 mg/dL      Peak postprandial:   less than 180 mg/dL (1-2 hours)      Critically ill patients:  140 - 180 mg/dL   Lab Results  Component Value Date   GLUCAP 107 (H) 05/12/2023   HGBA1C 7.2 (H) 05/10/2023    Review of Glycemic Control  Diabetes history: DM2 Outpatient Diabetes medications: Metformin 850 mg bid Current orders for Inpatient glycemic control: Novolog 0-15 units tid  Inpatient Diabetes Program Recommendations:   Patient is NPO. Please consider: -Change Novolog correction to 0-9 units q 4 hrs. While NPO.  Thank you, Dawn [name]. Maggy Wyble, RN, MSN, CDCES  Diabetes Coordinator Inpatient Glycemic Control Team Team Pager [phone] (8am-5pm) 05/12/2023 11:59 AM

## 2023-05-12 NOTE — Progress Notes (Signed)
  Progress Note   Patient: Dawn [name] ZOX:[id number] DOB: [birth year] DOA: 05/09/2023     2 DOS: the patient was seen and examined on 05/12/2023   Brief hospital course: Dawn [name] is a 83 y.o. female with medical history significant of HTN, IIDM, SLE, rheumatoid arthritis, anxiety/depression/PTSD, presented with worsening of RUQ pain nauseous vomiting and fever.  Upon arriving to hospital, patient had a heart rate of 111, white cell count of 17.9.  Lactic acid 2.8.  MRCP showed dilated common bile duct, evidence of ascending cholangitis.  General surgery and GI consulted, patient was also placed on antibiotics.   Principal Problem:   Acute cholangitis Active Problems:   Hypertension   PTSD (post-traumatic stress disorder)   Fibromyalgia   Type 2 diabetes mellitus with diabetic neuropathy, unspecified (HCC)   Paranoia (HCC)   Ascending cholangitis   Cholelithiasis with choledocholithiasis   Dementia with behavioral disturbance (HCC)   Severe sepsis (HCC)   Assessment and Plan: Severe sepsis secondary to ascending cholangitis. Choledocholithiasis with ascending cholangitis. Right knee hospital, patient had a heart rate of 117, white cell count of 17.9.  Lactic acid 2.8, met severe sepsis criteria. This is a secondary to ascending cholangitis which she has been demonstrated in MRCP. Blood cultures so far negative, lactic acid has normalized after fluids. Change antibiotics from Rocephin to cefepime.  Patient has allergy to penicillin. Patient is not agreeable for ERCP, will be performed today.  Most likely discharge home tomorrow with oral antibiotics.   Dementia with behavioral disturbance. Paranoia. Discussed with patient daughter-in-law, patient currently living in the memory unit with history of dementia.  She also has paranoia. She is evaluated by psychiatry, she has capacity to make her own decision.  Fortunately, she is agreeable for ERCP.   Essential  hypertension. Hold off blood pressure medicine patient has no risk of deterioration.   Type 2 diabetes. Continue sliding scale insulin.   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 Continue to follow.       Subjective:  Patient doing well today, no abdominal pain or nausea vomiting.  No fever or chills.  Physical Exam: Vitals:   05/11/23 1505 05/11/23 2032 05/12/23 0208 05/12/23 0725  BP: 138/64 139/80 125/64   Pulse: 77 76 72   Resp: 15 17 17    Temp: 98.5 F (36.9 C) 98.3 F (36.8 C) (!) 97.5 F (36.4 C)   TempSrc:   Oral   SpO2: 97% 95% 96%   Weight:    90.1 kg  Height:    5' 4.02" (1.626 m)   General exam: Appears calm and comfortable  Respiratory system: Clear to auscultation. Respiratory effort normal. Cardiovascular system: S1 & S2 heard, RRR. No JVD, murmurs, rubs, gallops or clicks. No pedal edema. Gastrointestinal system: Abdomen is nondistended, soft and nontender. No organomegaly or masses felt. Normal bowel sounds heard. Central nervous system: Alert and oriented. No focal neurological deficits. Extremities: Symmetric 5 x 5 power. Skin: No rashes, lesions or ulcers Psychiatry: Judgement and insight appear normal. Mood & affect appropriate.    Data Reviewed:  Lab results reviewed.  Family Communication: None  Disposition: Status is: Inpatient Remains inpatient appropriate because: Severity of disease, IV treatment, inpatient procedure.     Time spent: 35 minutes  Author: Marrion Coy, MD 05/12/2023 11:47 AM  For on call review [email or web address].

## 2023-05-12 NOTE — Anesthesia Postprocedure Evaluation (Signed)
 Anesthesia Post Note  Patient: Dawn [name]  Procedure(s) Performed: ERCP, WITH INTERVENTION IF INDICATED  Patient location during evaluation: PACU Anesthesia Type: General Level of consciousness: awake and alert, oriented and patient cooperative Pain management: pain level controlled Vital Signs Assessment: post-procedure vital signs reviewed and stable Respiratory status: spontaneous breathing, nonlabored ventilation and respiratory function stable Cardiovascular status: blood pressure returned to baseline and stable Postop Assessment: adequate PO intake Anesthetic complications: no   No notable events documented.   Last Vitals:  Vitals:   05/12/23 1330 05/12/23 1340  BP: 118/74 133/69  Pulse: 65 73  Resp: 15 15  Temp:    SpO2: 96% 97%    Last Pain:  Vitals:   05/12/23 1340  TempSrc:   PainSc: 0-No pain                 Reed Breech

## 2023-05-13 ENCOUNTER — Encounter
Admission: EM | Disposition: A | Discharge status: Home Health | Payer: Self-pay | Source: Home / Self Care | Attending: Internal Medicine

## 2023-05-13 ENCOUNTER — Encounter: Payer: Self-pay | Admitting: Gastroenterology

## 2023-05-13 DIAGNOSIS — R7881 Bacteremia: Secondary | ICD-10-CM | POA: Diagnosis present

## 2023-05-13 DIAGNOSIS — K8070 Calculus of gallbladder and bile duct without cholecystitis without obstruction: Secondary | ICD-10-CM | POA: Diagnosis not present

## 2023-05-13 DIAGNOSIS — K8309 Other cholangitis: Secondary | ICD-10-CM | POA: Diagnosis not present

## 2023-05-13 LAB — COMPREHENSIVE METABOLIC PANEL
ALT: 100 U/L — ABNORMAL HIGH (ref 0–44)
AST: 68 U/L — ABNORMAL HIGH (ref 15–41)
Albumin: 2.9 g/dL — ABNORMAL LOW (ref 3.5–5.0)
Alkaline Phosphatase: 146 U/L — ABNORMAL HIGH (ref 38–126)
Anion gap: 3 — ABNORMAL LOW (ref 5–15)
BUN: 12 mg/dL (ref 8–23)
CO2: 24 mmol/L (ref 22–32)
Calcium: 8.4 mg/dL — ABNORMAL LOW (ref 8.9–10.3)
Chloride: 109 mmol/L (ref 98–111)
Creatinine, Ser: 0.59 mg/dL (ref 0.44–1.00)
GFR, Estimated: >60 mL/min (ref >60)
Glucose, Bld: 104 mg/dL — ABNORMAL HIGH (ref 70–99)
Potassium: 4.1 mmol/L (ref 3.5–5.1)
Sodium: 136 mmol/L (ref 135–145)
Total Bilirubin: 2.3 mg/dL — ABNORMAL HIGH (ref 0.0–1.2)
Total Protein: 5.9 g/dL — ABNORMAL LOW (ref 6.5–8.1)

## 2023-05-13 LAB — GLUCOSE, CAPILLARY
Glucose-Capillary: 103 mg/dL — ABNORMAL HIGH (ref 70–99)
Glucose-Capillary: 209 mg/dL — ABNORMAL HIGH (ref 70–99)
Glucose-Capillary: 85 mg/dL (ref 70–99)
Glucose-Capillary: 183 mg/dL — ABNORMAL HIGH (ref 70–99)

## 2023-05-13 LAB — CBC
HCT: 37.2 % (ref 36.0–46.0)
Hemoglobin: 12.5 g/dL (ref 12.0–15.0)
MCH: 31.2 pg (ref 26.0–34.0)
MCHC: 33.6 g/dL (ref 30.0–36.0)
MCV: 92.8 fL (ref 80.0–100.0)
Platelets: 213 10*3/uL (ref 150–400)
RBC: 4.01 MIL/uL (ref 3.87–5.11)
RDW: 13 % (ref 11.5–15.5)
WBC: 6.9 10*3/uL (ref 4.0–10.5)
nRBC: 0 % (ref 0.0–0.2)

## 2023-05-13 LAB — MAGNESIUM: Magnesium: 2.1 mg/dL (ref 1.7–2.4)

## 2023-05-13 SURGERY — CHOLECYSTECTOMY, ROBOT-ASSISTED, LAPAROSCOPIC
Anesthesia: Choice

## 2023-05-13 MED ORDER — RISPERIDONE 0.25 MG PO TABS
0.2500 mg | ORAL_TABLET | Freq: Every day | ORAL | Status: DC
  Administered 2023-05-13 – 2023-05-18 (×6): 0.25 mg via ORAL
  Filled 2023-05-13 (×6): qty 1

## 2023-05-13 MED ORDER — AMLODIPINE BESYLATE 5 MG PO TABS
5.0000 mg | ORAL_TABLET | Freq: Every day | ORAL | Status: DC
  Administered 2023-05-13 – 2023-05-18 (×6): 5 mg via ORAL
  Filled 2023-05-13 (×6): qty 1

## 2023-05-13 MED ORDER — INDOCYANINE GREEN 25 MG IV SOLR
1.2500 mg | INTRAVENOUS | Status: AC
  Administered 2023-05-14: 1.25 mg via INTRAVENOUS

## 2023-05-13 MED ORDER — SODIUM CHLORIDE 0.9% FLUSH
10.0000 mL | Freq: Two times a day (BID) | INTRAVENOUS | Status: DC
  Administered 2023-05-14 – 2023-05-18 (×6): 10 mL

## 2023-05-13 NOTE — Progress Notes (Signed)
 [facility] SURGICAL PROGRESS NOTE (cpt [phone])  Hospital Day(s): 3.   Interval History: Patient seen and examined, no acute events or new complaints overnight. Patient reports she is doing well. No significant complaints. She continues to decline surgery. Labs this morning show resolution in leukocytosis; WBC 6.9K. Hgb to 12.5; stable. Renal function normal; sCr - 0.59; UO - unmeasured. Slight bump in bilirubin to 2.3; likely swelling related to ERCP. Switched to meropenem on 03/24 secondary to ESBL bacteremia; awaiting repeat Bcx. Diet restarted this AM.   Review of Systems:  Constitutional: denies fever, chills  HEENT: denies cough or congestion  Respiratory: denies any shortness of breath  Cardiovascular: denies chest pain or palpitations  Gastrointestinal: denies abdominal pain, N/V Genitourinary: denies burning with urination or urinary frequency Musculoskeletal: denies pain, decreased motor or sensation   Vital signs in last 24 hours: [min-max] current  Temp:  [96.6 F (35.9 C)-98.2 F (36.8 C)] 98 F (36.7 C) (03/26 0811) Pulse Rate:  [63-73] 72 (03/26 0811) Resp:  [14-18] 18 (03/26 0811) BP: (118-152)/(62-80) 152/74 (03/26 0811) SpO2:  [96 %-99 %] 97 % (03/26 0811)     Height: 5' 4.02" (162.6 cm) Weight: 90.1 kg BMI (Calculated): 34.08   Intake/Output last 2 shifts:  03/25 0701 - 03/26 0700 In: 100 [I.V.:100] Out: -    Physical Exam:  Constitutional: alert, cooperative and no distress  HENT: normocephalic without obvious abnormality  Eyes: PERRL, EOM's grossly intact and symmetric  Respiratory: breathing non-labored at rest  Cardiovascular: regular rate and sinus rhythm  Gastrointestinal: soft, non-tender, and non-distended Musculoskeletal: no edema or wounds, motor and sensation grossly intact, NT    Labs:     Latest Ref Rng & Units 05/13/2023    4:40 AM 05/11/2023    4:43 AM 05/10/2023    9:22 AM  CBC  WBC 4.0 - 10.5 K/uL 6.9  11.4  17.9    Hemoglobin 12.0 - 15.0 g/dL 60.4  54.0  98.1   Hematocrit 36.0 - 46.0 % 37.2  38.6  37.0   Platelets 150 - 400 K/uL 213  227  219       Latest Ref Rng & Units 05/13/2023    4:40 AM 05/12/2023    4:14 AM 05/11/2023    4:43 AM  CMP  Glucose 70 - 99 mg/dL 191  478  295   BUN 8 - 23 mg/dL 12  11  9    Creatinine 0.44 - 1.00 mg/dL 6.21  3.08  6.57   Sodium 135 - 145 mmol/L 136  137  137   Potassium 3.5 - 5.1 mmol/L 4.1  3.9  4.2   Chloride 98 - 111 mmol/L 109  106  105   CO2 22 - 32 mmol/L 24  23  23    Calcium 8.9 - 10.3 mg/dL 8.4  8.5  8.5   Total Protein 6.5 - 8.1 g/dL 5.9  6.1  6.9   Total Bilirubin 0.0 - 1.2 mg/dL 2.3  1.4  3.3   Alkaline Phos 38 - 126 U/L 146  126  154   AST 15 - 41 U/L 68  61  134   ALT 0 - 44 U/L 100  120  192      Imaging studies: No new pertinent imaging studies   Assessment/Plan:  83 y.o. female with choledocholithiasis and cholangitis s/p ERCP, labs improving, complicated by patient participation/psychiatric history                -  She continues to refuse cholecystectomy at this time. She understands the role for this in this setting. We will continue to follow up with her regarding this. We can see her as an outpatient as well  - Okay to resume diet; FLD and advance as tolerated; recommend avoiding/limiting fatty food  - Continue IV Abx (Meropenem); pending repeat BCx - Monitor abdominal examination - Monitor hyperbilirubinemia             - Mobilize as tolerated             - Further management per primary service     - Discharge Pending: Patient continues to refuse surgery at this time. Nothing more to offer. She can be PO challenged and potentially discharged home once medically cleared. We can consider interval cholecystectomy as outpatient as well if agreeable.   All of the above findings and recommendations were discussed with the patient, and the medical team, and all of patient's questions were answered to her expressed  satisfaction.  -- Lynden Oxford, PA-C [facility] 05/13/2023, 8:55 AM M-F: 7am - 4pm

## 2023-05-13 NOTE — Plan of Care (Signed)

## 2023-05-13 NOTE — Progress Notes (Addendum)
 Progress Note    Dawn [name]  VWU:[id number] DOB: Jan 10, 1941  DOA: 05/09/2023 PCP: [facility]      Brief Narrative:    Medical records reviewed and are as summarized below:  Dawn [name] is a 83 y.o. female with medical history significant of HTN, IIDM, SLE, rheumatoid arthritis, anxiety/depression/PTSD, presented with worsening of RUQ pain nauseous vomiting and fever.  Upon arriving to hospital, patient had a heart rate of 111, white cell count of 17.9.  Lactic acid 2.8.  MRCP showed dilated common bile duct, evidence of ascending cholangitis.  General surgery and GI consulted, patient was also placed on antibiotics.      Assessment/Plan:   Principal Problem:   Acute cholangitis Active Problems:   Hypertension   PTSD (post-traumatic stress disorder)   Fibromyalgia   Type 2 diabetes mellitus with diabetic neuropathy, unspecified (HCC)   Paranoia (HCC)   Ascending cholangitis   Cholelithiasis with choledocholithiasis   Dementia with behavioral disturbance (HCC)   Severe sepsis (HCC)   E coli bacteremia     Body mass index is 34.08 kg/m.   Severe sepsis secondary to ascending cholangitis. Choledocholithiasis with ascending cholangitis. E. coli bacteremia Blood culture showed ESBL E. coli.  Continue IV meropenem.  Follow-up culture sensitivity report. Patient had initially declined 3 inpatient gallbladder surgery.  She has now decided to proceed with surgery.  Dr. Aleen Campi, surgeon has been notified.  Follow-up with general surgeon for possible cholecystectomy prior to discharge..    Dementia with behavioral disturbance. PTSD, paranoia. Stable.  No acute issues. Continue venlafaxine, gabapentin and BuSpar Restart home Risperdal   Essential hypertension. Restart home amlodipine    Type 2 diabetes. Continue sliding scale insulin.    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 Continue to follow.             Diet Order             Diet  full liquid Fluid consistency: Thin  Diet effective now                            Consultants: Gastroenterologist General surgeon  Procedures: ERCP on 05/12/2023    Medications:    amLODipine  5 mg Oral Daily   busPIRone  15 mg Oral TID   enoxaparin (LOVENOX) injection  40 mg Subcutaneous Q24H   gabapentin  100 mg Oral TID   insulin aspart  0-15 Units Subcutaneous TID WC   loratadine  10 mg Oral Daily   pantoprazole  40 mg Oral Daily   risperiDONE  0.25 mg Oral Daily   venlafaxine XR  150 mg Oral Q breakfast   venlafaxine XR  75 mg Oral QHS   Continuous Infusions:  lactated ringers 75 mL/hr at 05/12/23 1251   meropenem (MERREM) IV 1 g (05/13/23 0531)     Anti-infectives (From admission, onward)    Start     Dose/Rate Route Frequency Ordered Stop   05/11/23 2200  meropenem (MERREM) 1 g in sodium chloride 0.9 % 100 mL IVPB        1 g 200 mL/hr over 30 Minutes Intravenous Every 8 hours 05/11/23 2058     05/11/23 0900  ceFEPIme (MAXIPIME) 2 g in sodium chloride 0.9 % 100 mL IVPB  Status:  Discontinued        2 g 200 mL/hr over 30 Minutes Intravenous Every 12 hours 05/11/23 0755 05/11/23 2058  05/11/23 0845  ceFEPIme (MAXIPIME) 1 g in sodium chloride 0.9 % 100 mL IVPB  Status:  Discontinued        1 g 200 mL/hr over 30 Minutes Intravenous Every 8 hours 05/11/23 0747 05/11/23 0755   05/10/23 1130  metroNIDAZOLE (FLAGYL) IVPB 500 mg  Status:  Discontinued        500 mg 100 mL/hr over 60 Minutes Intravenous Every 12 hours 05/10/23 1020 05/11/23 2058   05/10/23 1100  cefTRIAXone (ROCEPHIN) 2 g in sodium chloride 0.9 % 100 mL IVPB  Status:  Discontinued        2 g 200 mL/hr over 30 Minutes Intravenous Every 24 hours 05/10/23 1020 05/11/23 0746   05/09/23 2115  cefTRIAXone (ROCEPHIN) 2 g in sodium chloride 0.9 % 100 mL IVPB        2 g 200 mL/hr over 30 Minutes Intravenous  Once 05/09/23 2105 05/09/23 2220   05/09/23 2115  metroNIDAZOLE (FLAGYL) IVPB 500 mg         500 mg 100 mL/hr over 60 Minutes Intravenous  Once 05/09/23 2105 05/09/23 2319              Family Communication/Anticipated D/C date and plan/Code Status   DVT prophylaxis: enoxaparin (LOVENOX) injection 40 mg Start: 05/10/23 2000     Code Status: Full Code  Family Communication: None Disposition Plan: Plan to discharge to memory care unit   Status is: Inpatient Remains inpatient appropriate because: Cholangitis, E. coli bacteremia       Subjective:   Interval events noted.  No complaints.  No abdominal pain, nausea or vomiting.  Objective:    Vitals:   05/12/23 1340 05/12/23 1542 05/13/23 0512 05/13/23 0811  BP: 133/69 (!) 146/77 (!) 142/80 (!) 152/74  Pulse: 73 69 63 72  Resp: 15 18 16 18   Temp:  98.2 F (36.8 C) 98 F (36.7 C) 98 F (36.7 C)  TempSrc:   Oral   SpO2: 97% 97% 97% 97%  Weight:      Height:       No data found.   Intake/Output Summary (Last 24 hours) at 05/13/2023 1253 Last data filed at 05/13/2023 1041 Gross per 24 hour  Intake 340 ml  Output --  Net 340 ml   Filed Weights   05/11/23 0751 05/12/23 0725  Weight: 90.4 kg 90.1 kg    Exam:  GEN: NAD SKIN: Warm and dry EYES: No pallor or icterus ENT: MMM CV: RRR PULM: CTA B ABD: soft, obese, NT, +BS CNS: AAO x 3, non focal EXT: No edema or tenderness        Data Reviewed:   I have personally reviewed following labs and imaging studies:  Labs: Labs show the following:   Basic Metabolic Panel: Recent Labs  Lab 05/09/23 1440 05/10/23 0922 05/11/23 0443 05/12/23 0414 05/13/23 0440  NA 135 137 137 137 136  K 3.8 4.0 4.2 3.9 4.1  CL 103 106 105 106 109  CO2 26 23 23 23 24   GLUCOSE 170* 138* 147* 131* 104*  BUN 19 14 9 11 12   CREATININE 0.77 0.70 0.62 0.70 0.59  CALCIUM 8.1* 8.3* 8.5* 8.5* 8.4*  MG  --   --   --  2.0 2.1   GFR Estimated Creatinine Clearance: 59 mL/min (by C-G formula based on SCr of 0.59 mg/dL). Liver Function Tests: Recent Labs   Lab 05/09/23 1440 05/10/23 0922 05/11/23 0443 05/12/23 0414 05/13/23 0440  AST 352* 225* 134* 61*  68*  ALT 204* 245* 192* 120* 100*  ALKPHOS 189* 150* 154* 126 146*  BILITOT 1.9* 4.1* 3.3* 1.4* 2.3*  PROT 6.7 6.5 6.9 6.1* 5.9*  ALBUMIN 3.7 3.2* 3.6 3.0* 2.9*   Recent Labs  Lab 05/09/23 1440  LIPASE 21   No results for input(s): "AMMONIA" in the last 168 hours. Coagulation profile No results for input(s): "INR", "PROTIME" in the last 168 hours.  CBC: Recent Labs  Lab 05/09/23 1440 05/10/23 0922 05/11/23 0443 05/13/23 0440  WBC 17.6* 17.9* 11.4* 6.9  NEUTROABS  --  15.3*  --   --   HGB 13.7 12.4 13.0 12.5  HCT 41.7 37.0 38.6 37.2  MCV 97.4 95.4 93.0 92.8  PLT 225 219 227 213   Cardiac Enzymes: No results for input(s): "CKTOTAL", "CKMB", "CKMBINDEX", "TROPONINI" in the last 168 hours. BNP (last 3 results) No results for input(s): "PROBNP" in the last 8760 hours. CBG: Recent Labs  Lab 05/12/23 1132 05/12/23 1620 05/12/23 2052 05/13/23 0810 05/13/23 1126  GLUCAP 107* 119* 188* 103* 209*   D-Dimer: No results for input(s): "DDIMER" in the last 72 hours. Hgb A1c: No results for input(s): "HGBA1C" in the last 72 hours. Lipid Profile: No results for input(s): "CHOL", "HDL", "LDLCALC", "TRIG", "CHOLHDL", "LDLDIRECT" in the last 72 hours. Thyroid function studies: No results for input(s): "TSH", "T4TOTAL", "T3FREE", "THYROIDAB" in the last 72 hours.  Invalid input(s): "FREET3" Anemia work up: No results for input(s): "VITAMINB12", "FOLATE", "FERRITIN", "TIBC", "IRON", "RETICCTPCT" in the last 72 hours. Sepsis Labs: Recent Labs  Lab 05/09/23 1440 05/09/23 2126 05/09/23 2327 05/10/23 0922 05/10/23 1104 05/11/23 0443 05/13/23 0440  WBC 17.6*  --   --  17.9*  --  11.4* 6.9  LATICACIDVEN  --  2.8* 2.4* 1.0 1.0  --   --     Microbiology Recent Results (from the past 240 hours)  Resp panel by RT-PCR (RSV, Flu A&B, Covid) Anterior Nasal Swab     Status:  None   Collection Time: 05/09/23  2:40 PM   Specimen: Anterior Nasal Swab  Result Value Ref Range Status   SARS Coronavirus 2 by RT PCR NEGATIVE NEGATIVE Final    Comment: (NOTE) SARS-CoV-2 target nucleic acids are NOT DETECTED.  The SARS-CoV-2 RNA is generally detectable in upper respiratory specimens during the acute phase of infection. The lowest concentration of SARS-CoV-2 viral copies this assay can detect is 138 copies/mL. A negative result does not preclude SARS-Cov-2 infection and should not be used as the sole basis for treatment or other patient management decisions. A negative result may occur with  improper specimen collection/handling, submission of specimen other than nasopharyngeal swab, presence of viral mutation(s) within the areas targeted by this assay, and inadequate number of viral copies(<138 copies/mL). A negative result must be combined with clinical observations, patient history, and epidemiological information. The expected result is Negative.  Fact Sheet for Patients:  BloggerCourse.com  Fact Sheet for Healthcare Providers:  SeriousBroker.it  This test is no t yet approved or cleared by the [place] FDA and  has been authorized for detection and/or diagnosis of SARS-CoV-2 by FDA under an Emergency Use Authorization (EUA). This EUA will remain  in effect (meaning this test can be used) for the duration of the COVID-19 declaration under Section 564(b)(1) of the Act, 21 U.S.C.section 360bbb-3(b)(1), unless the authorization is terminated  or revoked sooner.       Influenza A by PCR NEGATIVE NEGATIVE Final   Influenza B by PCR NEGATIVE  NEGATIVE Final    Comment: (NOTE) The Xpert Xpress SARS-CoV-2/FLU/RSV plus assay is intended as an aid in the diagnosis of influenza from Nasopharyngeal swab specimens and should not be used as a sole basis for treatment. Nasal washings and aspirates are unacceptable  for Xpert Xpress SARS-CoV-2/FLU/RSV testing.  Fact Sheet for Patients: BloggerCourse.com  Fact Sheet for Healthcare Providers: SeriousBroker.it  This test is not yet approved or cleared by the [place] FDA and has been authorized for detection and/or diagnosis of SARS-CoV-2 by FDA under an Emergency Use Authorization (EUA). This EUA will remain in effect (meaning this test can be used) for the duration of the COVID-19 declaration under Section 564(b)(1) of the Act, 21 U.S.C. section 360bbb-3(b)(1), unless the authorization is terminated or revoked.     Resp Syncytial Virus by PCR NEGATIVE NEGATIVE Final    Comment: (NOTE) Fact Sheet for Patients: BloggerCourse.com  Fact Sheet for Healthcare Providers: SeriousBroker.it  This test is not yet approved or cleared by the [place] FDA and has been authorized for detection and/or diagnosis of SARS-CoV-2 by FDA under an Emergency Use Authorization (EUA). This EUA will remain in effect (meaning this test can be used) for the duration of the COVID-19 declaration under Section 564(b)(1) of the Act, 21 U.S.C. section 360bbb-3(b)(1), unless the authorization is terminated or revoked.  Performed at [facility], [address] [place], [place] [address]   Blood Culture (routine x 2)     Status: None (Preliminary result)   Collection Time: 05/09/23  8:15 PM   Specimen: BLOOD  Result Value Ref Range Status   Specimen Description   Final    BLOOD LEFT [facility] Performed at [facility], [address], [place], [place] [address]    Special Requests   Final    BOTTLES DRAWN AEROBIC AND ANAEROBIC Blood Culture adequate volume Performed at [facility], [address], [place], [place] [address]    Culture   Final    NO GROWTH 4 DAYS Performed at [facility], [address] [place], [place]  [address]    Report Status PENDING  Incomplete  Blood Culture ID Panel (Reflexed)     Status: Abnormal   Collection Time: 05/09/23  8:15 PM  Result Value Ref Range Status   Enterococcus faecalis NOT DETECTED NOT DETECTED Final   Enterococcus Faecium NOT DETECTED NOT DETECTED Final   Listeria monocytogenes NOT DETECTED NOT DETECTED Final   Staphylococcus species NOT DETECTED NOT DETECTED Final   Staphylococcus aureus (BCID) NOT DETECTED NOT DETECTED Final   Staphylococcus epidermidis NOT DETECTED NOT DETECTED Final   Staphylococcus lugdunensis NOT DETECTED NOT DETECTED Final   Streptococcus species NOT DETECTED NOT DETECTED Final   Streptococcus agalactiae NOT DETECTED NOT DETECTED Final   Streptococcus pneumoniae NOT DETECTED NOT DETECTED Final   Streptococcus pyogenes NOT DETECTED NOT DETECTED Final   A.calcoaceticus-baumannii NOT DETECTED NOT DETECTED Final   Bacteroides fragilis NOT DETECTED NOT DETECTED Final   Enterobacterales DETECTED (A) NOT DETECTED Final    Comment: Enterobacterales represent a large order of gram negative bacteria, not a single organism.   Enterobacter cloacae complex NOT DETECTED NOT DETECTED Final   Escherichia coli DETECTED (A) NOT DETECTED Final    Comment: CRITICAL RESULT CALLED TO, READ BACK BY AND VERIFIED WITH: MERRILL, K. 2034 05/11/23 LRL    Klebsiella aerogenes NOT DETECTED NOT DETECTED Final   Klebsiella oxytoca NOT DETECTED NOT DETECTED Final   Klebsiella pneumoniae NOT DETECTED NOT DETECTED  Final   Proteus species NOT DETECTED NOT DETECTED Final   Salmonella species NOT DETECTED NOT DETECTED Final   Serratia marcescens NOT DETECTED NOT DETECTED Final   Haemophilus influenzae NOT DETECTED NOT DETECTED Final   Neisseria meningitidis NOT DETECTED NOT DETECTED Final   Pseudomonas aeruginosa NOT DETECTED NOT DETECTED Final   Stenotrophomonas maltophilia NOT DETECTED NOT DETECTED Final   Candida albicans NOT DETECTED NOT DETECTED Final   Candida  auris NOT DETECTED NOT DETECTED Final   Candida glabrata NOT DETECTED NOT DETECTED Final   Candida krusei NOT DETECTED NOT DETECTED Final   Candida parapsilosis NOT DETECTED NOT DETECTED Final   Candida tropicalis NOT DETECTED NOT DETECTED Final   Cryptococcus neoformans/gattii NOT DETECTED NOT DETECTED Final   CTX-M ESBL DETECTED (A) NOT DETECTED Final    Comment: CRITICAL RESULT CALLED TO, READ BACK BY AND VERIFIED WITH: MERRILL, K. 2034 05/11/23 LRL (NOTE) Extended spectrum beta-lactamase detected. Recommend a carbapenem as initial therapy.      Carbapenem resistance IMP NOT DETECTED NOT DETECTED Final   Carbapenem resistance KPC NOT DETECTED NOT DETECTED Final   Carbapenem resistance NDM NOT DETECTED NOT DETECTED Final   Carbapenem resist OXA 48 LIKE NOT DETECTED NOT DETECTED Final   Carbapenem resistance VIM NOT DETECTED NOT DETECTED Final    Comment: Performed at [facility], [address] [place], [place] [address]  Blood Culture (routine x 2)     Status: Abnormal (Preliminary result)   Collection Time: 05/09/23  8:31 PM   Specimen: BLOOD  Result Value Ref Range Status   Specimen Description   Final    BLOOD RIGHT FA Performed at [facility], [address], [place], [place] [address]    Special Requests   Final    BOTTLES DRAWN AEROBIC AND ANAEROBIC Blood Culture results may not be optimal due to an inadequate volume of blood received in culture bottles Performed at [facility], [address], [place], [place] [address]    Culture  Setup Time   Final    GRAM NEGATIVE RODS AEROBIC BOTTLE ONLY Organism ID to follow CRITICAL RESULT CALLED TO, READ BACK BY AND VERIFIED WITH: MERRILL, K. 2034 05/11/23 LRL    Culture (A)  Final    ESCHERICHIA COLI SUSCEPTIBILITIES TO FOLLOW Performed at [facility], [address], [place], [place] [address]    Report Status PENDING  Incomplete    Procedures and diagnostic studies:  DG  C-Arm 1-60 Min-No Report Result Date: 05/12/2023 Fluoroscopy was utilized by the requesting physician.  No radiographic interpretation.               LOS: 3 days   Chistine Dematteo  [facility]   Pager on [email or web address]. If 7PM-7AM, please contact night-coverage at [email or web address]     05/13/2023, 12:53 PM

## 2023-05-13 NOTE — Progress Notes (Signed)
 The patient is status post ERCP without any immediate complications.  The patient's liver enzymes have gone up slightly overnight likely due to anemia status post sphincterotomy with extraction of sludge.  I spoke to surgery today who report the patient is refusing to have her gallbladder out.  Nothing further to do from a GI point of view.

## 2023-05-13 NOTE — TOC Progression Note (Signed)
 Transition of Care [facility]) - Progression Note    Patient Details  Name: Dawn [name] MRN: [id number] Date of Birth: 1940-09-25  [facility] CM/SW Contact  Chapman Fitch, RN Phone Number: 05/13/2023, 10:31 AM  Clinical Narrative:     Per Tammy at [facility] patient is active with [facility] for PT and OT.  Per Tammy they will provide dc transport when patient leaves the hospital        Expected Discharge Plan and Services                                               Social Determinants of Health (SDOH) Interventions SDOH Screenings   Food Insecurity: No Food Insecurity (05/10/2023)  Housing: Low Risk  (05/10/2023)  Transportation Needs: No Transportation Needs (05/10/2023)  Utilities: Not At Risk (05/10/2023)  Alcohol Screen: Low Risk  (07/22/2021)  Depression (PHQ2-9): Low Risk  (11/25/2021)  Recent Concern: Depression (PHQ2-9) - High Risk (11/05/2021)  Social Connections: Socially Isolated (05/10/2023)  Stress: Stress Concern Present (07/22/2021)  Tobacco Use: Unknown (05/12/2023)    Readmission Risk Interventions     No data to display

## 2023-05-13 NOTE — Progress Notes (Signed)

## 2023-05-13 NOTE — Care Management Important Message (Signed)
 Important Message  Patient Details  Name: Dawn [name] MRN: [id number] Date of Birth: [birth year]   Important Message Given:  Yes - Medicare IM     Cristela Blue, CMA 05/13/2023, 11:17 AM

## 2023-05-13 NOTE — Progress Notes (Addendum)
   05/13/23 1500  Spiritual Encounters  Type of Visit Initial  Care provided to: Patient  Conversation partners present during encounter Nurse  Referral source Patient request  Reason for visit Routine spiritual support  OnCall Visit No  Spiritual Framework  Presenting Themes Meaning/purpose/sources of inspiration;Goals in life/care;Rituals and practive;Courage hope and growth;Impactful experiences and emotions;Coping tools   Chaplain received a page because the patient requested to talk to someone. Patient shared with the chaplain that she is feeling very anxious and paranoid. Patient shared that she has not talked to her children and is sad that she has not talked to them or seen them. Patient also shared that she would like to talk to a social worker about being moved to a different living facility. Chaplain told the patient that she would reach out to her SW. Chaplain also talked to the patients nurse who shared that the patients son visited her today and has come to see her several times. Chaplain reached out to patient's Child psychotherapist and MD and suggest that the patient have a memory test done. Chaplain will continue to follow up with patient.

## 2023-05-14 ENCOUNTER — Inpatient Hospital Stay: Admitting: Anesthesiology

## 2023-05-14 ENCOUNTER — Encounter
Admission: EM | Disposition: A | Discharge status: Home Health | Payer: Self-pay | Source: Home / Self Care | Attending: Internal Medicine

## 2023-05-14 ENCOUNTER — Other Ambulatory Visit: Payer: Self-pay

## 2023-05-14 DIAGNOSIS — K8070 Calculus of gallbladder and bile duct without cholecystitis without obstruction: Secondary | ICD-10-CM | POA: Diagnosis not present

## 2023-05-14 DIAGNOSIS — K429 Umbilical hernia without obstruction or gangrene: Secondary | ICD-10-CM

## 2023-05-14 DIAGNOSIS — K8309 Other cholangitis: Secondary | ICD-10-CM | POA: Diagnosis not present

## 2023-05-14 HISTORY — PX: UMBILICAL HERNIA REPAIR: SHX196

## 2023-05-14 LAB — CBC
HCT: 39.4 % (ref 36.0–46.0)
Hemoglobin: 12.9 g/dL (ref 12.0–15.0)
MCH: 31.2 pg (ref 26.0–34.0)
MCHC: 32.7 g/dL (ref 30.0–36.0)
MCV: 95.2 fL (ref 80.0–100.0)
Platelets: 247 10*3/uL (ref 150–400)
RBC: 4.14 MIL/uL (ref 3.87–5.11)
RDW: 13.1 % (ref 11.5–15.5)
WBC: 7.3 10*3/uL (ref 4.0–10.5)
nRBC: 0 % (ref 0.0–0.2)

## 2023-05-14 LAB — COMPREHENSIVE METABOLIC PANEL WITH GFR
ALT: 93 U/L — ABNORMAL HIGH (ref 0–44)
AST: 69 U/L — ABNORMAL HIGH (ref 15–41)
Albumin: 3.2 g/dL — ABNORMAL LOW (ref 3.5–5.0)
Alkaline Phosphatase: 206 U/L — ABNORMAL HIGH (ref 38–126)
Anion gap: 9 (ref 5–15)
BUN: 8 mg/dL (ref 8–23)
CO2: 26 mmol/L (ref 22–32)
Calcium: 8.7 mg/dL — ABNORMAL LOW (ref 8.9–10.3)
Chloride: 103 mmol/L (ref 98–111)
Creatinine, Ser: 0.66 mg/dL (ref 0.44–1.00)
GFR, Estimated: >60 mL/min (ref >60)
Glucose, Bld: 145 mg/dL — ABNORMAL HIGH (ref 70–99)
Potassium: 4.1 mmol/L (ref 3.5–5.1)
Sodium: 138 mmol/L (ref 135–145)
Total Bilirubin: 1.3 mg/dL — ABNORMAL HIGH (ref 0.0–1.2)
Total Protein: 6.2 g/dL — ABNORMAL LOW (ref 6.5–8.1)

## 2023-05-14 LAB — CULTURE, BLOOD (ROUTINE X 2)
Culture  Setup Time: NO GROWTH
Culture  Setup Time: NO GROWTH
Special Requests: ADEQUATE

## 2023-05-14 LAB — GLUCOSE, CAPILLARY
Glucose-Capillary: 154 mg/dL — ABNORMAL HIGH (ref 70–99)
Glucose-Capillary: 189 mg/dL — ABNORMAL HIGH (ref 70–99)
Glucose-Capillary: 279 mg/dL — ABNORMAL HIGH (ref 70–99)
Glucose-Capillary: 180 mg/dL — ABNORMAL HIGH (ref 70–99)

## 2023-05-14 SURGERY — CHOLECYSTECTOMY, ROBOT-ASSISTED, LAPAROSCOPIC
Anesthesia: General

## 2023-05-14 MED ORDER — FENTANYL CITRATE (PF) 100 MCG/2ML IJ SOLN
INTRAMUSCULAR | Status: DC | PRN
  Administered 2023-05-14 (×2): 50 ug via INTRAVENOUS

## 2023-05-14 MED ORDER — ROCURONIUM BROMIDE 10 MG/ML (PF) SYRINGE
PREFILLED_SYRINGE | INTRAVENOUS | Status: AC
  Filled 2023-05-14: qty 10

## 2023-05-14 MED ORDER — FENTANYL CITRATE (PF) 100 MCG/2ML IJ SOLN
25.0000 ug | INTRAMUSCULAR | Status: DC | PRN
  Administered 2023-05-14 (×4): 25 ug via INTRAVENOUS

## 2023-05-14 MED ORDER — INDOCYANINE GREEN 25 MG IV SOLR
INTRAVENOUS | Status: AC
  Filled 2023-05-14: qty 10

## 2023-05-14 MED ORDER — LIDOCAINE HCL (PF) 2 % IJ SOLN
INTRAMUSCULAR | Status: AC
  Filled 2023-05-14: qty 5

## 2023-05-14 MED ORDER — BUPIVACAINE LIPOSOME 1.3 % IJ SUSP
INTRAMUSCULAR | Status: AC
  Filled 2023-05-14: qty 10

## 2023-05-14 MED ORDER — SUGAMMADEX SODIUM 200 MG/2ML IV SOLN
INTRAVENOUS | Status: AC
  Filled 2023-05-14: qty 2

## 2023-05-14 MED ORDER — FENTANYL CITRATE (PF) 100 MCG/2ML IJ SOLN
INTRAMUSCULAR | Status: AC
  Filled 2023-05-14: qty 2

## 2023-05-14 MED ORDER — CEFAZOLIN SODIUM-DEXTROSE 2-4 GM/100ML-% IV SOLN
INTRAVENOUS | Status: AC
  Filled 2023-05-14: qty 100

## 2023-05-14 MED ORDER — ACETAMINOPHEN 10 MG/ML IV SOLN
INTRAVENOUS | Status: DC | PRN
  Administered 2023-05-14: 1000 mg via INTRAVENOUS

## 2023-05-14 MED ORDER — OXYCODONE HCL 5 MG PO TABS
5.0000 mg | ORAL_TABLET | ORAL | Status: DC | PRN
  Administered 2023-05-15: 5 mg via ORAL
  Administered 2023-05-15: 10 mg via ORAL
  Administered 2023-05-15 – 2023-05-16 (×2): 5 mg via ORAL
  Administered 2023-05-16: 10 mg via ORAL
  Administered 2023-05-16: 5 mg via ORAL
  Administered 2023-05-16: 10 mg via ORAL
  Administered 2023-05-17: 5 mg via ORAL
  Filled 2023-05-14: qty 1
  Filled 2023-05-14 (×4): qty 2
  Filled 2023-05-14 (×3): qty 1
  Filled 2023-05-14: qty 2
  Filled 2023-05-14: qty 1

## 2023-05-14 MED ORDER — BUPIVACAINE-EPINEPHRINE (PF) 0.25% -1:200000 IJ SOLN
INTRAMUSCULAR | Status: AC
  Filled 2023-05-14: qty 90

## 2023-05-14 MED ORDER — 0.9 % SODIUM CHLORIDE (POUR BTL) OPTIME
TOPICAL | Status: DC | PRN
  Administered 2023-05-14: 1000 mL

## 2023-05-14 MED ORDER — ONDANSETRON HCL 4 MG/2ML IJ SOLN
INTRAMUSCULAR | Status: DC | PRN
  Administered 2023-05-14: 4 mg via INTRAVENOUS

## 2023-05-14 MED ORDER — LIDOCAINE HCL (CARDIAC) PF 100 MG/5ML IV SOSY
PREFILLED_SYRINGE | INTRAVENOUS | Status: DC | PRN
  Administered 2023-05-14: 100 mg via INTRAVENOUS

## 2023-05-14 MED ORDER — CEFAZOLIN SODIUM-DEXTROSE 2-4 GM/100ML-% IV SOLN
2.0000 g | INTRAVENOUS | Status: AC
  Administered 2023-05-14: 2 g via INTRAVENOUS
  Filled 2023-05-14: qty 100

## 2023-05-14 MED ORDER — ACETAMINOPHEN 10 MG/ML IV SOLN
INTRAVENOUS | Status: AC
  Filled 2023-05-14: qty 100

## 2023-05-14 MED ORDER — SUGAMMADEX SODIUM 200 MG/2ML IV SOLN
INTRAVENOUS | Status: DC | PRN
  Administered 2023-05-14: 200 mg via INTRAVENOUS

## 2023-05-14 MED ORDER — ACETAMINOPHEN 500 MG PO TABS
1000.0000 mg | ORAL_TABLET | Freq: Four times a day (QID) | ORAL | Status: DC | PRN
  Administered 2023-05-18: 1000 mg via ORAL
  Filled 2023-05-14: qty 2

## 2023-05-14 MED ORDER — OXYCODONE HCL 5 MG/5ML PO SOLN
5.0000 mg | Freq: Once | ORAL | Status: AC | PRN
  Administered 2023-05-14: 5 mg via ORAL

## 2023-05-14 MED ORDER — PHENYLEPHRINE HCL-NACL 20-0.9 MG/250ML-% IV SOLN
INTRAVENOUS | Status: DC | PRN
  Administered 2023-05-14: 30 ug/min via INTRAVENOUS

## 2023-05-14 MED ORDER — PHENYLEPHRINE HCL-NACL 20-0.9 MG/250ML-% IV SOLN
INTRAVENOUS | Status: AC
  Filled 2023-05-14: qty 250

## 2023-05-14 MED ORDER — PROPOFOL 10 MG/ML IV BOLUS
INTRAVENOUS | Status: DC | PRN
  Administered 2023-05-14: 120 mg via INTRAVENOUS

## 2023-05-14 MED ORDER — HYDROMORPHONE HCL 1 MG/ML IJ SOLN
0.5000 mg | INTRAMUSCULAR | Status: DC | PRN
  Administered 2023-05-14 – 2023-05-15 (×3): 0.5 mg via INTRAVENOUS
  Filled 2023-05-14 (×3): qty 0.5

## 2023-05-14 MED ORDER — KETOROLAC TROMETHAMINE 30 MG/ML IJ SOLN: INTRAMUSCULAR | Status: DC | PRN

## 2023-05-14 MED ORDER — BUPIVACAINE-EPINEPHRINE 0.25% -1:200000 IJ SOLN
INTRAMUSCULAR | Status: DC | PRN
  Administered 2023-05-14: 40 mL via INTRAMUSCULAR

## 2023-05-14 MED ORDER — OXYCODONE HCL 5 MG/5ML PO SOLN
ORAL | Status: AC
  Filled 2023-05-14: qty 5

## 2023-05-14 MED ORDER — ONDANSETRON HCL 4 MG/2ML IJ SOLN
INTRAMUSCULAR | Status: AC
  Filled 2023-05-14: qty 2

## 2023-05-14 MED ORDER — PROPOFOL 10 MG/ML IV BOLUS
INTRAVENOUS | Status: AC
  Filled 2023-05-14: qty 20

## 2023-05-14 MED ORDER — OXYCODONE HCL 5 MG PO TABS: 5.0000 mg | ORAL_TABLET | Freq: Once | ORAL | Status: AC | PRN

## 2023-05-14 MED ORDER — ROCURONIUM BROMIDE 100 MG/10ML IV SOLN
INTRAVENOUS | Status: DC | PRN
Start: 2023-05-14 — End: 2023-05-14
  Administered 2023-05-14: 60 mg via INTRAVENOUS

## 2023-05-14 MED ORDER — DEXAMETHASONE SODIUM PHOSPHATE 10 MG/ML IJ SOLN
INTRAMUSCULAR | Status: AC
  Filled 2023-05-14: qty 1

## 2023-05-14 MED ORDER — PHENYLEPHRINE 80 MCG/ML (10ML) SYRINGE FOR IV PUSH (FOR BLOOD PRESSURE SUPPORT)
PREFILLED_SYRINGE | INTRAVENOUS | Status: DC | PRN
  Administered 2023-05-14: 160 ug via INTRAVENOUS

## 2023-05-14 MED ORDER — DEXMEDETOMIDINE HCL IN NACL 80 MCG/20ML IV SOLN
INTRAVENOUS | Status: DC | PRN
  Administered 2023-05-14: 4 ug via INTRAVENOUS

## 2023-05-14 MED ORDER — PHENYLEPHRINE 80 MCG/ML (10ML) SYRINGE FOR IV PUSH (FOR BLOOD PRESSURE SUPPORT)
PREFILLED_SYRINGE | INTRAVENOUS | Status: AC
  Filled 2023-05-14: qty 10

## 2023-05-14 MED ORDER — DEXAMETHASONE SODIUM PHOSPHATE 10 MG/ML IJ SOLN
INTRAMUSCULAR | Status: DC | PRN
  Administered 2023-05-14: 5 mg via INTRAVENOUS

## 2023-05-14 SURGICAL SUPPLY — 39 items
BAG PRESSURE INF REUSE 1000 (BAG) IMPLANT
CAUTERY HOOK MNPLR 1.6 DVNC XI (INSTRUMENTS) ×1 IMPLANT
CLIP LIGATING HEMO O LOK GREEN (MISCELLANEOUS) ×1 IMPLANT
DERMABOND ADVANCED .7 DNX12 (GAUZE/BANDAGES/DRESSINGS) ×1 IMPLANT
DRAPE ARM DVNC X/XI (DISPOSABLE) ×4 IMPLANT
DRAPE COLUMN DVNC XI (DISPOSABLE) ×1 IMPLANT
ELECT CAUTERY BLADE TIP 2.5 (TIP) ×1 IMPLANT
ELECT REM PT RETURN 9FT ADLT (ELECTROSURGICAL) ×1 IMPLANT
ELECTRODE CAUTERY BLDE TIP 2.5 (TIP) ×1 IMPLANT
ELECTRODE REM PT RTRN 9FT ADLT (ELECTROSURGICAL) ×1 IMPLANT
FORCEPS BPLR R/ABLATION 8 DVNC (INSTRUMENTS) ×1 IMPLANT
FORCEPS PROGRASP DVNC XI (FORCEP) ×1 IMPLANT
GLOVE SURG SYN 7.0 (GLOVE) ×3 IMPLANT
GLOVE SURG SYN 7.0 PF PI (GLOVE) ×2 IMPLANT
GLOVE SURG SYN 7.5 E (GLOVE) ×3 IMPLANT
GLOVE SURG SYN 7.5 PF PI (GLOVE) ×2 IMPLANT
GOWN STRL REUS W/ TWL LRG LVL3 (GOWN DISPOSABLE) ×4 IMPLANT
IRRIGATOR SUCT 8 DISP DVNC XI (IRRIGATION / IRRIGATOR) IMPLANT
IV NS 1000ML BAXH (IV SOLUTION) IMPLANT
KIT PINK PAD W/HEAD ARE REST (MISCELLANEOUS) ×1 IMPLANT
KIT PINK PAD W/HEAD ARM REST (MISCELLANEOUS) ×1 IMPLANT
LABEL OR SOLS (LABEL) ×1 IMPLANT
MANIFOLD NEPTUNE II (INSTRUMENTS) ×1 IMPLANT
NDL HYPO 22X1.5 SAFETY MO (MISCELLANEOUS) ×1 IMPLANT
NEEDLE HYPO 22X1.5 SAFETY MO (MISCELLANEOUS) ×1 IMPLANT
NS IRRIG 500ML POUR BTL (IV SOLUTION) ×1 IMPLANT
OBTURATOR OPTICAL STND 8 DVNC (TROCAR) ×1 IMPLANT
OBTURATOR OPTICALSTD 8 DVNC (TROCAR) ×1 IMPLANT
PACK LAP CHOLECYSTECTOMY (MISCELLANEOUS) ×1 IMPLANT
SEAL UNIV 5-12 XI (MISCELLANEOUS) ×4 IMPLANT
SET TUBE SMOKE EVAC HIGH FLOW (TUBING) ×1 IMPLANT
SOL ELECTROSURG ANTI STICK (MISCELLANEOUS) ×1 IMPLANT
SOLUTION ELECTROSURG ANTI STCK (MISCELLANEOUS) ×1 IMPLANT
SUT MNCRL AB 4-0 PS2 18 (SUTURE) ×1 IMPLANT
SUT VIC AB 2-0 SH 27XBRD (SUTURE) IMPLANT
SUT VICRYL 0 UR6 27IN ABS (SUTURE) ×2 IMPLANT
SYS BAG RETRIEVAL 10MM (BASKET) ×1 IMPLANT
SYSTEM BAG RETRIEVAL 10MM (BASKET) ×1 IMPLANT
WATER STERILE IRR 500ML POUR (IV SOLUTION) ×1 IMPLANT

## 2023-05-14 NOTE — Transfer of Care (Signed)
 Immediate Anesthesia Transfer of Care Note  Patient: Dawn [name]  Procedure(s) Performed: CHOLECYSTECTOMY, ROBOT-ASSISTED, LAPAROSCOPIC REPAIR, HERNIA, UMBILICAL, ADULT  Patient Location: PACU  Anesthesia Type:General  Level of Consciousness: awake  Airway & Oxygen Therapy: Patient Spontanous Breathing and Patient connected to face mask oxygen  Post-op Assessment: Report given to RN and Post -op Vital signs reviewed and stable  Post vital signs: Reviewed and stable  Last Vitals:  Vitals Value Taken Time  BP 170/61 05/14/23 1234  Temp    Pulse 79 05/14/23 1235  Resp 19 05/14/23 1235  SpO2 100 % 05/14/23 1235  Vitals shown include unfiled device data.  Last Pain:  Vitals:   05/14/23 0849  TempSrc: Temporal  PainSc: 7       Patients Stated Pain Goal: 0 (05/10/23 2000)  Complications: No notable events documented.

## 2023-05-14 NOTE — Anesthesia Postprocedure Evaluation (Signed)
 Anesthesia Post Note  Patient: Dawn [name]  Procedure(s) Performed: CHOLECYSTECTOMY, ROBOT-ASSISTED, LAPAROSCOPIC REPAIR, HERNIA, UMBILICAL, ADULT  Patient location during evaluation: PACU Anesthesia Type: General Level of consciousness: awake and alert Pain management: pain level controlled Vital Signs Assessment: post-procedure vital signs reviewed and stable Respiratory status: spontaneous breathing, nonlabored ventilation, respiratory function stable and patient connected to nasal cannula oxygen Cardiovascular status: blood pressure returned to baseline and stable Postop Assessment: no apparent nausea or vomiting Anesthetic complications: no  No notable events documented.   Last Vitals:  Vitals:   05/14/23 1238 05/14/23 1250  BP:    Pulse: 81 77  Resp: 11 16  Temp:    SpO2: 100% 100%    Last Pain:  Vitals:   05/14/23 1250  TempSrc:   PainSc: 10-Worst pain ever                 [facility]

## 2023-05-14 NOTE — Op Note (Signed)
  Procedure Date:  05/14/2023  Pre-operative Diagnosis:  Acute cholangitis with choledocholithiasis  Post-operative Diagnosis: Acute cholangitis with choledocholithiasis; reducible umbilical hernia 1 cm.  Procedure:  Robotic assisted cholecystectomy with ICG FireFly cholangiogram; open umbilical hernia repair.  Surgeon:  Howie Ill, MD  Anesthesia:  General endotracheal  Estimated Blood Loss:  15 ml  Specimens:  gallbladder  Complications:  None  Indications for Procedure:  This is a 83 y.o. female who presents with abdominal pain and workup revealing acute cholangitis.  She's s/p ERCP and now is ready for cholecystectomy.  The benefits, complications, treatment options, and expected outcomes were discussed with the patient. The risks of bleeding, infection, recurrence of symptoms, failure to resolve symptoms, bile duct damage, bile duct leak, retained common bile duct stone, bowel injury, and need for further procedures were all discussed with the patient and she was willing to proceed.  Description of Procedure: The patient was correctly identified in the preoperative area and brought into the operating room.  The patient was placed supine with VTE prophylaxis in place.  Appropriate time-outs were performed.  Anesthesia was induced and the patient was intubated.  Appropriate antibiotics were infused.  The abdomen was prepped and draped in a sterile fashion. An infraumbilical incision was made. Cautery was used to dissect along the umbilical stalk and to separate the stalk from the underlying fascia.  This revealed a 1 cm defect.  The fascial edges were cleared and a 12 mm robotic port was inserted.  Pneumoperitoneum was obtained with appropriate opening pressures.  Three 8-mm ports were placed in the mid abdomen at the level of the umbilicus under direct visualization.  The DaVinci platform was docked, camera targeted, and instruments were placed under direct visualization.  The  gallbladder was identified.  The fundus was grasped and retracted cephalad.  Adhesions were lysed bluntly and with electrocautery. The infundibulum was grasped and retracted laterally, exposing the peritoneum overlying the gallbladder.  This was incised with electrocautery and extended on either side of the gallbladder.  FireFly cholangiogram was then obtained, and we were able to clearly identify the cystic duct and common bile duct.  The cystic duct and cystic artery were carefully dissected with combination of cautery and blunt dissection.  Both were clipped twice proximally and once distally, cutting in between.  The gallbladder was taken from the gallbladder fossa in a retrograde fashion with electrocautery. The gallbladder was placed in an Endocatch bag. The liver bed was inspected and any bleeding was controlled with electrocautery. The right upper quadrant was then inspected again revealing intact clips, no bleeding, and no ductal injury.  The area was thoroughly irrigated.  The 8 mm ports were removed under direct visualization and the 12 mm port was removed.  The Endocatch bag was brought out via the umbilical incision. The hernia defect was closed using 0 Vicryl sutures.  Local anesthetic was infused in all incisions.  The umbilical stalk was reattached using 2-0 Vicryl sutures.  The umbilical incision was then closed in layers using 3-0 Vicryl and 4-0 Monocryl.  The other port incisions were closed with 4-0 Monocryl.  The wounds were cleaned and sealed with DermaBond.  The patient was emerged from anesthesia and extubated and brought to the recovery room for further management.  The patient tolerated the procedure well and all counts were correct at the end of the case.   Howie Ill, MD

## 2023-05-14 NOTE — Plan of Care (Signed)
  Problem: Coping: Goal: Ability to adjust to condition or change in health will improve Outcome: Progressing   Problem: Education: Goal: Knowledge of General Education information will improve Description: Including pain rating scale, medication(s)/side effects and non-pharmacologic comfort measures Outcome: Progressing   Problem: Coping: Goal: Level of anxiety will decrease Outcome: Progressing   Problem: Pain Managment: Goal: General experience of comfort will improve and/or be controlled Outcome: Progressing   Problem: Safety: Goal: Ability to remain free from injury will improve Outcome: Progressing

## 2023-05-14 NOTE — Anesthesia Procedure Notes (Signed)
 Procedure Name: Intubation Date/Time: 05/14/2023 10:38 AM  Performed by: Elisabeth Pigeon, CRNAPre-anesthesia Checklist: Patient identified, Patient being monitored, Timeout performed, Emergency Drugs available and Suction available Patient Re-evaluated:Patient Re-evaluated prior to induction Oxygen Delivery Method: Circle system utilized Preoxygenation: Pre-oxygenation with 100% oxygen Induction Type: IV induction Ventilation: Mask ventilation without difficulty Laryngoscope Size: Mac, 3 and McGrath Grade View: Grade I Tube type: Oral Tube size: 6.5 mm Number of attempts: 1 Airway Equipment and Method: Stylet Placement Confirmation: ETT inserted through vocal cords under direct vision, positive ETCO2 and breath sounds checked- equal and bilateral Secured at: 21 cm Tube secured with: Tape Dental Injury: Teeth and Oropharynx as per pre-operative assessment

## 2023-05-14 NOTE — Progress Notes (Signed)
 05/14/2023  Subjective: No acute events overnight.  Patient reports feeling well.  Discussed with her yesterday and she wanted to proceed with surgery today.  Scheduled for robotic cholecystectomy.  LFTs improving.  Blood cultures pending.  Vital signs: Temp:  [97.7 F (36.5 C)-98.6 F (37 C)] 98.6 F (37 C) (03/27 0501) Pulse Rate:  [72-87] 78 (03/27 0501) Resp:  [16-18] 18 (03/27 0501) BP: (144-156)/(74-79) 144/79 (03/27 0501) SpO2:  [96 %-100 %] 96 % (03/27 0501)   Intake/Output: 03/26 0701 - 03/27 0700 In: 240 [P.O.:240] Out: 400 [Urine:400] Last BM Date : 05/11/23  Physical Exam: Constitutional:  No acute distress Abdomen:  soft, non-distended, with some discomfort in epigastric area.  Negative Murphy's sign.  Labs:  Recent Labs    05/13/23 0440 05/14/23 0448  WBC 6.9 7.3  HGB 12.5 12.9  HCT 37.2 39.4  PLT 213 247   Recent Labs    05/13/23 0440 05/14/23 0448  NA 136 138  K 4.1 4.1  CL 109 103  CO2 24 26  GLUCOSE 104* 145*  BUN 12 8  CREATININE 0.59 0.66  CALCIUM 8.4* 8.7*   No results for input(s): "LABPROT", "INR" in the last 72 hours.  Imaging: No results found.  Assessment/Plan: This is a 83 y.o. female with acute cholangitis from choledocholithiasis.  --Patient scheduled for surgery today.  Discussed surgical plan again with her and she's willing to proceed.  Consent order placed. --Continue IV meropenem while awaiting blood culture sensitivities.   I spent 35 minutes dedicated to the care of this patient on the date of this encounter to include pre-visit review of records, face-to-face time with the patient discussing diagnosis and management, and any post-visit coordination of care.  Howie Ill, MD [facility]

## 2023-05-14 NOTE — Progress Notes (Signed)
 Progress Note    Dawn [name]  WUJ:[id number] DOB: Sep 06, 1940  DOA: 05/09/2023 PCP: [facility]      Brief Narrative:    Medical records reviewed and are as summarized below:  Dawn [name] is a 83 y.o. female with medical history significant of HTN, IIDM, SLE, rheumatoid arthritis, anxiety/depression/PTSD, presented with worsening of RUQ pain nauseous vomiting and fever.  Upon arriving to hospital, patient had a heart rate of 111, white cell count of 17.9.  Lactic acid 2.8.  MRCP showed dilated common bile duct, evidence of ascending cholangitis.  General surgery and GI consulted, patient was also placed on antibiotics.      Assessment/Plan:   Principal Problem:   Acute cholangitis Active Problems:   Hypertension   PTSD (post-traumatic stress disorder)   Fibromyalgia   Type 2 diabetes mellitus with diabetic neuropathy, unspecified (HCC)   Paranoia (HCC)   Ascending cholangitis   Cholelithiasis with choledocholithiasis   Dementia with behavioral disturbance (HCC)   Severe sepsis (HCC)   E coli bacteremia   Umbilical hernia without obstruction and without gangrene     Body mass index is 34.08 kg/m.   Severe sepsis secondary to ascending cholangitis. Choledocholithiasis with ascending cholangitis. E. coli bacteremia Blood culture showed ESBL E. coli.  Continue IV meropenem to complete 7 days of treatment..  Oral option available is Bactrim but patient is intolerant of Bactrim. S/p laparoscopic cholecystectomy on 05/14/2023.   Analgesics as needed for pain. Follow-up with general surgeon.    Dementia with behavioral disturbance. PTSD, paranoia. Stable.  No acute issues. Continue venlafaxine, gabapentin and BuSpar Restart home Risperdal   Essential hypertension. Continue amlodipine    Type 2 diabetes. Continue sliding scale insulin.    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 Weight loss advised             Diet Order              Diet clear liquid Fluid consistency: Thin  Diet effective now                            Consultants: Gastroenterologist General surgeon  Procedures: ERCP on 05/12/2023    Medications:    amLODipine  5 mg Oral Daily   busPIRone  15 mg Oral TID   gabapentin  100 mg Oral TID   insulin aspart  0-15 Units Subcutaneous TID WC   loratadine  10 mg Oral Daily   pantoprazole  40 mg Oral Daily   risperiDONE  0.25 mg Oral Daily   sodium chloride flush  10-40 mL Intracatheter Q12H   venlafaxine XR  150 mg Oral Q breakfast   venlafaxine XR  75 mg Oral QHS   Continuous Infusions:  lactated ringers 75 mL/hr at 05/14/23 1408   meropenem (MERREM) IV 1 g (05/14/23 1409)     Anti-infectives (From admission, onward)    Start     Dose/Rate Route Frequency Ordered Stop   05/14/23 0800  ceFAZolin (ANCEF) IVPB 2g/100 mL premix        2 g 200 mL/hr over 30 Minutes Intravenous On call to O.R. 05/14/23 0705 05/14/23 1055   05/11/23 2200  meropenem (MERREM) 1 g in sodium chloride 0.9 % 100 mL IVPB        1 g 200 mL/hr over 30 Minutes Intravenous Every 8 hours 05/11/23 2058     05/11/23 0900  ceFEPIme (MAXIPIME) 2 g  in sodium chloride 0.9 % 100 mL IVPB  Status:  Discontinued        2 g 200 mL/hr over 30 Minutes Intravenous Every 12 hours 05/11/23 0755 05/11/23 2058   05/11/23 0845  ceFEPIme (MAXIPIME) 1 g in sodium chloride 0.9 % 100 mL IVPB  Status:  Discontinued        1 g 200 mL/hr over 30 Minutes Intravenous Every 8 hours 05/11/23 0747 05/11/23 0755   05/10/23 1130  metroNIDAZOLE (FLAGYL) IVPB 500 mg  Status:  Discontinued        500 mg 100 mL/hr over 60 Minutes Intravenous Every 12 hours 05/10/23 1020 05/11/23 2058   05/10/23 1100  cefTRIAXone (ROCEPHIN) 2 g in sodium chloride 0.9 % 100 mL IVPB  Status:  Discontinued        2 g 200 mL/hr over 30 Minutes Intravenous Every 24 hours 05/10/23 1020 05/11/23 0746   05/09/23 2115  cefTRIAXone (ROCEPHIN) 2 g in sodium chloride 0.9 %  100 mL IVPB        2 g 200 mL/hr over 30 Minutes Intravenous  Once 05/09/23 2105 05/09/23 2220   05/09/23 2115  metroNIDAZOLE (FLAGYL) IVPB 500 mg        500 mg 100 mL/hr over 60 Minutes Intravenous  Once 05/09/23 2105 05/09/23 2319              Family Communication/Anticipated D/C date and plan/Code Status   DVT prophylaxis:      Code Status: Full Code  Family Communication: None Disposition Plan: Plan to discharge to memory care unit   Status is: Inpatient Remains inpatient appropriate because: Cholangitis, E. coli bacteremia       Subjective:   Interval events noted.  No complaints.  I woke her up from her sleep.  Objective:    Vitals:   05/14/23 1326 05/14/23 1330 05/14/23 1337 05/14/23 1404  BP:  (!) 126/56  (!) 123/54  Pulse: 77 79 82 76  Resp: 12 11 14 14   Temp:  98.1 F (36.7 C)  97.9 F (36.6 C)  TempSrc:    Oral  SpO2: 93% 93% 95% 94%  Weight:      Height:       No data found.   Intake/Output Summary (Last 24 hours) at 05/14/2023 1606 Last data filed at 05/14/2023 1342 Gross per 24 hour  Intake 1592.5 ml  Output 265 ml  Net 1327.5 ml   Filed Weights   05/11/23 0751 05/12/23 0725  Weight: 90.4 kg 90.1 kg    Exam:  GEN: NAD SKIN: Warm and dry EYES: No pallor or icterus ENT: MMM CV: RRR PULM: CTA B ABD: soft, obese, multiple small surgical incisions, mild surgical tenderness CNS: AAO x 3, non focal EXT: No edema or tenderness      Data Reviewed:   I have personally reviewed following labs and imaging studies:  Labs: Labs show the following:   Basic Metabolic Panel: Recent Labs  Lab 05/10/23 0922 05/11/23 0443 05/12/23 0414 05/13/23 0440 05/14/23 0448  NA 137 137 137 136 138  K 4.0 4.2 3.9 4.1 4.1  CL 106 105 106 109 103  CO2 23 23 23 24 26   GLUCOSE 138* 147* 131* 104* 145*  BUN 14 9 11 12 8   CREATININE 0.70 0.62 0.70 0.59 0.66  CALCIUM 8.3* 8.5* 8.5* 8.4* 8.7*  MG  --   --  2.0 2.1  --     GFR Estimated Creatinine Clearance: 59 mL/min (by C-G  formula based on SCr of 0.66 mg/dL). Liver Function Tests: Recent Labs  Lab 05/10/23 0922 05/11/23 0443 05/12/23 0414 05/13/23 0440 05/14/23 0448  AST 225* 134* 61* 68* 69*  ALT 245* 192* 120* 100* 93*  ALKPHOS 150* 154* 126 146* 206*  BILITOT 4.1* 3.3* 1.4* 2.3* 1.3*  PROT 6.5 6.9 6.1* 5.9* 6.2*  ALBUMIN 3.2* 3.6 3.0* 2.9* 3.2*   Recent Labs  Lab 05/09/23 1440  LIPASE 21   No results for input(s): "AMMONIA" in the last 168 hours. Coagulation profile No results for input(s): "INR", "PROTIME" in the last 168 hours.  CBC: Recent Labs  Lab 05/09/23 1440 05/10/23 0922 05/11/23 0443 05/13/23 0440 05/14/23 0448  WBC 17.6* 17.9* 11.4* 6.9 7.3  NEUTROABS  --  15.3*  --   --   --   HGB 13.7 12.4 13.0 12.5 12.9  HCT 41.7 37.0 38.6 37.2 39.4  MCV 97.4 95.4 93.0 92.8 95.2  PLT 225 219 227 213 247   Cardiac Enzymes: No results for input(s): "CKTOTAL", "CKMB", "CKMBINDEX", "TROPONINI" in the last 168 hours. BNP (last 3 results) No results for input(s): "PROBNP" in the last 8760 hours. CBG: Recent Labs  Lab 05/13/23 1126 05/13/23 1606 05/13/23 2125 05/14/23 0803 05/14/23 1243  GLUCAP 209* 85 183* 154* 189*   D-Dimer: No results for input(s): "DDIMER" in the last 72 hours. Hgb A1c: No results for input(s): "HGBA1C" in the last 72 hours. Lipid Profile: No results for input(s): "CHOL", "HDL", "LDLCALC", "TRIG", "CHOLHDL", "LDLDIRECT" in the last 72 hours. Thyroid function studies: No results for input(s): "TSH", "T4TOTAL", "T3FREE", "THYROIDAB" in the last 72 hours.  Invalid input(s): "FREET3" Anemia work up: No results for input(s): "VITAMINB12", "FOLATE", "FERRITIN", "TIBC", "IRON", "RETICCTPCT" in the last 72 hours. Sepsis Labs: Recent Labs  Lab 05/09/23 2126 05/09/23 2327 05/10/23 0922 05/10/23 1104 05/11/23 0443 05/13/23 0440 05/14/23 0448  WBC  --   --  17.9*  --  11.4* 6.9 7.3  LATICACIDVEN  2.8* 2.4* 1.0 1.0  --   --   --     Microbiology Recent Results (from the past 240 hours)  Resp panel by RT-PCR (RSV, Flu A&B, Covid) Anterior Nasal Swab     Status: None   Collection Time: 05/09/23  2:40 PM   Specimen: Anterior Nasal Swab  Result Value Ref Range Status   SARS Coronavirus 2 by RT PCR NEGATIVE NEGATIVE Final    Comment: (NOTE) SARS-CoV-2 target nucleic acids are NOT DETECTED.  The SARS-CoV-2 RNA is generally detectable in upper respiratory specimens during the acute phase of infection. The lowest concentration of SARS-CoV-2 viral copies this assay can detect is 138 copies/mL. A negative result does not preclude SARS-Cov-2 infection and should not be used as the sole basis for treatment or other patient management decisions. A negative result may occur with  improper specimen collection/handling, submission of specimen other than nasopharyngeal swab, presence of viral mutation(s) within the areas targeted by this assay, and inadequate number of viral copies(<138 copies/mL). A negative result must be combined with clinical observations, patient history, and epidemiological information. The expected result is Negative.  Fact Sheet for Patients:  BloggerCourse.com  Fact Sheet for Healthcare Providers:  SeriousBroker.it  This test is no t yet approved or cleared by the [place] FDA and  has been authorized for detection and/or diagnosis of SARS-CoV-2 by FDA under an Emergency Use Authorization (EUA). This EUA will remain  in effect (meaning this test can be used) for the duration of the  COVID-19 declaration under Section 564(b)(1) of the Act, 21 U.S.C.section 360bbb-3(b)(1), unless the authorization is terminated  or revoked sooner.       Influenza A by PCR NEGATIVE NEGATIVE Final   Influenza B by PCR NEGATIVE NEGATIVE Final    Comment: (NOTE) The Xpert Xpress SARS-CoV-2/FLU/RSV plus assay is intended as an  aid in the diagnosis of influenza from Nasopharyngeal swab specimens and should not be used as a sole basis for treatment. Nasal washings and aspirates are unacceptable for Xpert Xpress SARS-CoV-2/FLU/RSV testing.  Fact Sheet for Patients: BloggerCourse.com  Fact Sheet for Healthcare Providers: SeriousBroker.it  This test is not yet approved or cleared by the [place] FDA and has been authorized for detection and/or diagnosis of SARS-CoV-2 by FDA under an Emergency Use Authorization (EUA). This EUA will remain in effect (meaning this test can be used) for the duration of the COVID-19 declaration under Section 564(b)(1) of the Act, 21 U.S.C. section 360bbb-3(b)(1), unless the authorization is terminated or revoked.     Resp Syncytial Virus by PCR NEGATIVE NEGATIVE Final    Comment: (NOTE) Fact Sheet for Patients: BloggerCourse.com  Fact Sheet for Healthcare Providers: SeriousBroker.it  This test is not yet approved or cleared by the [place] FDA and has been authorized for detection and/or diagnosis of SARS-CoV-2 by FDA under an Emergency Use Authorization (EUA). This EUA will remain in effect (meaning this test can be used) for the duration of the COVID-19 declaration under Section 564(b)(1) of the Act, 21 U.S.C. section 360bbb-3(b)(1), unless the authorization is terminated or revoked.  Performed at [facility], [address] [place], [place] [address]   Blood Culture (routine x 2)     Status: None   Collection Time: 05/09/23  8:15 PM   Specimen: BLOOD  Result Value Ref Range Status   Specimen Description BLOOD LEFT [facility]  Final   Special Requests   Final    BOTTLES DRAWN AEROBIC AND ANAEROBIC Blood Culture adequate volume   Culture   Final    NO GROWTH 5 DAYS Performed at [facility], [address] [place], [place] [address]    Report  Status 05/14/2023 FINAL  Final  Blood Culture ID Panel (Reflexed)     Status: Abnormal   Collection Time: 05/09/23  8:15 PM  Result Value Ref Range Status   Enterococcus faecalis NOT DETECTED NOT DETECTED Final   Enterococcus Faecium NOT DETECTED NOT DETECTED Final   Listeria monocytogenes NOT DETECTED NOT DETECTED Final   Staphylococcus species NOT DETECTED NOT DETECTED Final   Staphylococcus aureus (BCID) NOT DETECTED NOT DETECTED Final   Staphylococcus epidermidis NOT DETECTED NOT DETECTED Final   Staphylococcus lugdunensis NOT DETECTED NOT DETECTED Final   Streptococcus species NOT DETECTED NOT DETECTED Final   Streptococcus agalactiae NOT DETECTED NOT DETECTED Final   Streptococcus pneumoniae NOT DETECTED NOT DETECTED Final   Streptococcus pyogenes NOT DETECTED NOT DETECTED Final   A.calcoaceticus-baumannii NOT DETECTED NOT DETECTED Final   Bacteroides fragilis NOT DETECTED NOT DETECTED Final   Enterobacterales DETECTED (A) NOT DETECTED Final    Comment: Enterobacterales represent a large order of gram negative bacteria, not a single organism.   Enterobacter cloacae complex NOT DETECTED NOT DETECTED Final   Escherichia coli DETECTED (A) NOT DETECTED Final    Comment: CRITICAL RESULT CALLED TO, READ BACK BY AND VERIFIED WITH: MERRILL, K. 2034 05/11/23 LRL    Klebsiella aerogenes NOT DETECTED NOT DETECTED Final   Klebsiella oxytoca NOT DETECTED NOT DETECTED Final  Klebsiella pneumoniae NOT DETECTED NOT DETECTED Final   Proteus species NOT DETECTED NOT DETECTED Final   Salmonella species NOT DETECTED NOT DETECTED Final   Serratia marcescens NOT DETECTED NOT DETECTED Final   Haemophilus influenzae NOT DETECTED NOT DETECTED Final   Neisseria meningitidis NOT DETECTED NOT DETECTED Final   Pseudomonas aeruginosa NOT DETECTED NOT DETECTED Final   Stenotrophomonas maltophilia NOT DETECTED NOT DETECTED Final   Candida albicans NOT DETECTED NOT DETECTED Final   Candida auris NOT  DETECTED NOT DETECTED Final   Candida glabrata NOT DETECTED NOT DETECTED Final   Candida krusei NOT DETECTED NOT DETECTED Final   Candida parapsilosis NOT DETECTED NOT DETECTED Final   Candida tropicalis NOT DETECTED NOT DETECTED Final   Cryptococcus neoformans/gattii NOT DETECTED NOT DETECTED Final   CTX-M ESBL DETECTED (A) NOT DETECTED Final    Comment: CRITICAL RESULT CALLED TO, READ BACK BY AND VERIFIED WITH: MERRILL, K. 2034 05/11/23 LRL (NOTE) Extended spectrum beta-lactamase detected. Recommend a carbapenem as initial therapy.      Carbapenem resistance IMP NOT DETECTED NOT DETECTED Final   Carbapenem resistance KPC NOT DETECTED NOT DETECTED Final   Carbapenem resistance NDM NOT DETECTED NOT DETECTED Final   Carbapenem resist OXA 48 LIKE NOT DETECTED NOT DETECTED Final   Carbapenem resistance VIM NOT DETECTED NOT DETECTED Final    Comment: Performed at [facility], [address], [place], [place] [address]  Blood Culture (routine x 2)     Status: Abnormal   Collection Time: 05/09/23  8:31 PM   Specimen: BLOOD  Result Value Ref Range Status   Specimen Description   Final    BLOOD RIGHT FA Performed at [facility], [address], [place], [place] [address]    Special Requests   Final    BOTTLES DRAWN AEROBIC AND ANAEROBIC Blood Culture results may not be optimal due to an inadequate volume of blood received in culture bottles Performed at [facility], [address], [place], [place] [address]    Culture  Setup Time   Final    GRAM NEGATIVE RODS AEROBIC BOTTLE ONLY Organism ID to follow CRITICAL RESULT CALLED TO, READ BACK BY AND VERIFIED WITH: MERRILL, K. 2034 05/11/23 LRL Performed at [facility], [address], [place], [place] [address]    Culture (A)  Final    ESCHERICHIA COLI Confirmed Extended Spectrum Beta-Lactamase Producer (ESBL).  In bloodstream infections from ESBL organisms, carbapenems are preferred over  piperacillin/tazobactam. They are shown to have a lower risk of mortality.    Report Status 05/14/2023 FINAL  Final   Organism ID, Bacteria ESCHERICHIA COLI  Final      Susceptibility   Escherichia coli - MIC*    AMPICILLIN >=32 RESISTANT Resistant     CEFEPIME 16 RESISTANT Resistant     CEFTAZIDIME RESISTANT Resistant     CEFTRIAXONE >=64 RESISTANT Resistant     CIPROFLOXACIN >=4 RESISTANT Resistant     GENTAMICIN <=1 SENSITIVE Sensitive     IMIPENEM <=0.25 SENSITIVE Sensitive     TRIMETH/SULFA <=20 SENSITIVE Sensitive     AMPICILLIN/SULBACTAM 8 SENSITIVE Sensitive     PIP/TAZO <=4 SENSITIVE Sensitive ug/mL    * ESCHERICHIA COLI    Procedures and diagnostic studies:  No results found.              LOS: 4 days   Kahlea Cobert  [facility]   Pager on [email or web address]. If 7PM-7AM, please contact night-coverage at [email or web address]  05/14/2023, 4:06 PM

## 2023-05-14 NOTE — Anesthesia Preprocedure Evaluation (Signed)
 Anesthesia Evaluation  Patient identified by MRN, date of birth, ID band Patient awake    Reviewed: Allergy & Precautions, NPO status , Patient's Chart, lab work & pertinent test results  History of Anesthesia Complications Negative for: history of anesthetic complications  Airway Mallampati: I   Neck ROM: Full    Dental  (+) Missing   Pulmonary neg pulmonary ROS   Pulmonary exam normal breath sounds clear to auscultation       Cardiovascular hypertension, On Medications +CHF  Normal cardiovascular exam Rhythm:Regular Rate:Normal  ECG 04/12/23:  Sinus rhythm Low voltage, precordial leads Borderline repolarization abnormality Baseline wander in lead(s) V5 V6   Neuro/Psych  PSYCHIATRIC DISORDERS (PTSD; paranoia) Anxiety Depression   Dementia negative neurological ROS     GI/Hepatic ,GERD  ,,  Endo/Other  diabetes, Type 2  Obesity   Renal/GU negative Renal ROS     Musculoskeletal  (+) Arthritis , Osteoarthritis and Rheumatoid disorders,  Fibromyalgia -Lupus    Abdominal   Peds  Hematology negative hematology ROS (+)   Anesthesia Other Findings   Reproductive/Obstetrics                             Anesthesia Physical Anesthesia Plan  ASA: 3  Anesthesia Plan: General   Post-op Pain Management: Toradol IV (intra-op)* and Ofirmev IV (intra-op)*   Induction: Intravenous  PONV Risk Score and Plan: 3 and Treatment may vary due to age or medical condition, Ondansetron and Dexamethasone  Airway Management Planned: Oral ETT  Additional Equipment:   Intra-op Plan:   Post-operative Plan: Extubation in OR  Informed Consent: I have reviewed the patients History and Physical, chart, labs and discussed the procedure including the risks, benefits and alternatives for the proposed anesthesia with the patient or authorized representative who has indicated his/her understanding and acceptance.      Dental Advisory Given  Plan Discussed with: CRNA  Anesthesia Plan Comments: (Patient consented for risks of anesthesia including but not limited to:  - adverse reactions to medications - damage to eyes, teeth, lips or other oral mucosa - nerve damage due to positioning  - sore throat or hoarseness - Damage to heart, brain, nerves, lungs, other parts of body or loss of life  Patient voiced understanding and assent.)        Anesthesia Quick Evaluation

## 2023-05-15 ENCOUNTER — Encounter: Payer: Self-pay | Admitting: Surgery

## 2023-05-15 DIAGNOSIS — K8309 Other cholangitis: Secondary | ICD-10-CM | POA: Diagnosis not present

## 2023-05-15 LAB — COMPREHENSIVE METABOLIC PANEL WITH GFR
ALT: 82 U/L — ABNORMAL HIGH (ref 0–44)
AST: 79 U/L — ABNORMAL HIGH (ref 15–41)
Albumin: 3.1 g/dL — ABNORMAL LOW (ref 3.5–5.0)
Alkaline Phosphatase: 175 U/L — ABNORMAL HIGH (ref 38–126)
Anion gap: 8 (ref 5–15)
BUN: 11 mg/dL (ref 8–23)
CO2: 23 mmol/L (ref 22–32)
Calcium: 8.7 mg/dL — ABNORMAL LOW (ref 8.9–10.3)
Chloride: 104 mmol/L (ref 98–111)
Creatinine, Ser: 0.57 mg/dL (ref 0.44–1.00)
GFR, Estimated: >60 mL/min (ref >60)
Glucose, Bld: 142 mg/dL — ABNORMAL HIGH (ref 70–99)
Potassium: 4.5 mmol/L (ref 3.5–5.1)
Sodium: 135 mmol/L (ref 135–145)
Total Bilirubin: 0.9 mg/dL (ref 0.0–1.2)
Total Protein: 6 g/dL — ABNORMAL LOW (ref 6.5–8.1)

## 2023-05-15 LAB — CBC WITH DIFFERENTIAL/PLATELET
Abs Immature Granulocytes: 0.08 10*3/uL — ABNORMAL HIGH (ref 0.00–0.07)
Basophils Absolute: 0 10*3/uL (ref 0.0–0.1)
Basophils Relative: 0 %
Eosinophils Absolute: 0 10*3/uL (ref 0.0–0.5)
Eosinophils Relative: 0 %
HCT: 37.4 % (ref 36.0–46.0)
Hemoglobin: 12.7 g/dL (ref 12.0–15.0)
Immature Granulocytes: 1 %
Lymphocytes Relative: 14 %
Lymphs Abs: 1.6 10*3/uL (ref 0.7–4.0)
MCH: 32.2 pg (ref 26.0–34.0)
MCHC: 34 g/dL (ref 30.0–36.0)
MCV: 94.9 fL (ref 80.0–100.0)
Monocytes Absolute: 0.8 10*3/uL (ref 0.1–1.0)
Monocytes Relative: 7 %
Neutro Abs: 8.8 10*3/uL — ABNORMAL HIGH (ref 1.7–7.7)
Neutrophils Relative %: 78 %
Platelets: 233 10*3/uL (ref 150–400)
RBC: 3.94 MIL/uL (ref 3.87–5.11)
RDW: 13.3 % (ref 11.5–15.5)
WBC: 11.3 10*3/uL — ABNORMAL HIGH (ref 4.0–10.5)
nRBC: 0 % (ref 0.0–0.2)

## 2023-05-15 LAB — GLUCOSE, CAPILLARY
Glucose-Capillary: 135 mg/dL — ABNORMAL HIGH (ref 70–99)
Glucose-Capillary: 193 mg/dL — ABNORMAL HIGH (ref 70–99)
Glucose-Capillary: 115 mg/dL — ABNORMAL HIGH (ref 70–99)
Glucose-Capillary: 170 mg/dL — ABNORMAL HIGH (ref 70–99)

## 2023-05-15 LAB — SURGICAL PATHOLOGY

## 2023-05-15 MED ORDER — ENOXAPARIN SODIUM 40 MG/0.4ML IJ SOSY
40.0000 mg | PREFILLED_SYRINGE | INTRAMUSCULAR | Status: DC
  Administered 2023-05-15 – 2023-05-17 (×3): 40 mg via SUBCUTANEOUS
  Filled 2023-05-15 (×3): qty 0.4

## 2023-05-15 MED ORDER — OXYCODONE HCL 5 MG PO TABS: 5.0000 mg | ORAL_TABLET | Freq: Four times a day (QID) | ORAL | 0 refills | Status: AC | PRN

## 2023-05-15 NOTE — Progress Notes (Signed)
 Progress Note    Dawn [name]  VWU:[id number] DOB: [birth year]  DOA: 05/09/2023 PCP: [facility]      Brief Narrative:    Medical records reviewed and are as summarized below:  Dawn [name] is a 83 y.o. female with medical history significant of HTN, IIDM, SLE, rheumatoid arthritis, anxiety/depression/PTSD, presented with worsening of RUQ pain nauseous vomiting and fever.  Upon arriving to hospital, patient had a heart rate of 111, white cell count of 17.9.  Lactic acid 2.8.  MRCP showed dilated common bile duct, evidence of ascending cholangitis.  General surgery and GI consulted, patient was also placed on antibiotics.      Assessment/Plan:   Principal Problem:   Acute cholangitis Active Problems:   Hypertension   PTSD (post-traumatic stress disorder)   Fibromyalgia   Type 2 diabetes mellitus with diabetic neuropathy, unspecified (HCC)   Paranoia (HCC)   Ascending cholangitis   Cholelithiasis with choledocholithiasis   Dementia with behavioral disturbance (HCC)   Severe sepsis (HCC)   E coli bacteremia   Umbilical hernia without obstruction and without gangrene     Body mass index is 34.08 kg/m.   Severe sepsis secondary to ascending cholangitis. Choledocholithiasis with ascending cholangitis. E. coli bacteremia Blood culture showed ESBL E. coli.  Continue meropenem through 05/17/2023 to complete 7 days of treatment.  Patient is concerned about using Bactrim because of reported "allergic to sulfa".  S/p laparoscopic cholecystectomy on 05/14/2023.   Analgesics as needed for pain. Follow-up with general surgeon.    Dementia with behavioral disturbance. PTSD, paranoia. Stable.  No acute issues. Continue Risperdal, venlafaxine, gabapentin and BuSpar   Essential hypertension. Continue amlodipine    Type 2 diabetes. Continue sliding scale insulin.    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 Weight loss advised             Diet Order              Diet heart healthy/carb modified Room service appropriate? Yes; Fluid consistency: Thin  Diet effective now                            Consultants: Gastroenterologist General surgeon  Procedures: ERCP on 05/12/2023    Medications:    amLODipine  5 mg Oral Daily   busPIRone  15 mg Oral TID   enoxaparin (LOVENOX) injection  40 mg Subcutaneous Q24H   gabapentin  100 mg Oral TID   insulin aspart  0-15 Units Subcutaneous TID WC   loratadine  10 mg Oral Daily   pantoprazole  40 mg Oral Daily   risperiDONE  0.25 mg Oral Daily   sodium chloride flush  10-40 mL Intracatheter Q12H   venlafaxine XR  150 mg Oral Q breakfast   venlafaxine XR  75 mg Oral QHS   Continuous Infusions:  lactated ringers 75 mL/hr at 05/15/23 0507   meropenem (MERREM) IV 1 g (05/15/23 0508)     Anti-infectives (From admission, onward)    Start     Dose/Rate Route Frequency Ordered Stop   05/14/23 0800  ceFAZolin (ANCEF) IVPB 2g/100 mL premix        2 g 200 mL/hr over 30 Minutes Intravenous On call to O.R. 05/14/23 0705 05/14/23 1055   05/11/23 2200  meropenem (MERREM) 1 g in sodium chloride 0.9 % 100 mL IVPB        1 g 200 mL/hr over 30 Minutes Intravenous  Every 8 hours 05/11/23 2058     05/11/23 0900  ceFEPIme (MAXIPIME) 2 g in sodium chloride 0.9 % 100 mL IVPB  Status:  Discontinued        2 g 200 mL/hr over 30 Minutes Intravenous Every 12 hours 05/11/23 0755 05/11/23 2058   05/11/23 0845  ceFEPIme (MAXIPIME) 1 g in sodium chloride 0.9 % 100 mL IVPB  Status:  Discontinued        1 g 200 mL/hr over 30 Minutes Intravenous Every 8 hours 05/11/23 0747 05/11/23 0755   05/10/23 1130  metroNIDAZOLE (FLAGYL) IVPB 500 mg  Status:  Discontinued        500 mg 100 mL/hr over 60 Minutes Intravenous Every 12 hours 05/10/23 1020 05/11/23 2058   05/10/23 1100  cefTRIAXone (ROCEPHIN) 2 g in sodium chloride 0.9 % 100 mL IVPB  Status:  Discontinued        2 g 200 mL/hr over 30 Minutes  Intravenous Every 24 hours 05/10/23 1020 05/11/23 0746   05/09/23 2115  cefTRIAXone (ROCEPHIN) 2 g in sodium chloride 0.9 % 100 mL IVPB        2 g 200 mL/hr over 30 Minutes Intravenous  Once 05/09/23 2105 05/09/23 2220   05/09/23 2115  metroNIDAZOLE (FLAGYL) IVPB 500 mg        500 mg 100 mL/hr over 60 Minutes Intravenous  Once 05/09/23 2105 05/09/23 2319              Family Communication/Anticipated D/C date and plan/Code Status   DVT prophylaxis: enoxaparin (LOVENOX) injection 40 mg Start: 05/15/23 1300 SCDs Start: 05/15/23 1054     Code Status: Full Code  Family Communication: None Disposition Plan: Plan to discharge to memory care unit   Status is: Inpatient Remains inpatient appropriate because: Cholangitis, E. coli bacteremia       Subjective:   Interval events noted.  She complains of mild discomfort in the upper abdomen.  No other complaints.  Objective:    Vitals:   05/14/23 1728 05/14/23 1954 05/15/23 0443 05/15/23 0722  BP: (!) (P) 102/48 107/61 135/71 (!) 118/59  Pulse: (P) 71 71 80 67  Resp: (P) 16 16 20 16   Temp: (P) 97.6 F (36.4 C) 97.6 F (36.4 C) 98.5 F (36.9 C) 98.1 F (36.7 C)  TempSrc: (P) Oral Oral Oral Oral  SpO2: (P) 96% 94% 98% 98%  Weight:      Height:       No data found.   Intake/Output Summary (Last 24 hours) at 05/15/2023 1334 Last data filed at 05/15/2023 1100 Gross per 24 hour  Intake 1889.44 ml  Output --  Net 1889.44 ml   Filed Weights   05/11/23 0751 05/12/23 0725  Weight: 90.4 kg 90.1 kg    Exam:   GEN: NAD SKIN: Warm and dry EYES: No pallor oral icterus ENT: MMM CV: RRR PULM: CTA B ABD: soft, obese, nontender, multiple small surgical incisions are intact CNS: AAO x 3, non focal EXT: No edema or tenderness       Data Reviewed:   I have personally reviewed following labs and imaging studies:  Labs: Labs show the following:   Basic Metabolic Panel: Recent Labs  Lab 05/11/23 0443  05/12/23 0414 05/13/23 0440 05/14/23 0448 05/15/23 0448  NA 137 137 136 138 135  K 4.2 3.9 4.1 4.1 4.5  CL 105 106 109 103 104  CO2 23 23 24 26 23   GLUCOSE 147* 131* 104* 145* 142*  BUN 9 11 12 8 11   CREATININE 0.62 0.70 0.59 0.66 0.57  CALCIUM 8.5* 8.5* 8.4* 8.7* 8.7*  MG  --  2.0 2.1  --   --    GFR Estimated Creatinine Clearance: 59 mL/min (by C-G formula based on SCr of 0.57 mg/dL). Liver Function Tests: Recent Labs  Lab 05/11/23 0443 05/12/23 0414 05/13/23 0440 05/14/23 0448 05/15/23 0448  AST 134* 61* 68* 69* 79*  ALT 192* 120* 100* 93* 82*  ALKPHOS 154* 126 146* 206* 175*  BILITOT 3.3* 1.4* 2.3* 1.3* 0.9  PROT 6.9 6.1* 5.9* 6.2* 6.0*  ALBUMIN 3.6 3.0* 2.9* 3.2* 3.1*   Recent Labs  Lab 05/09/23 1440  LIPASE 21   No results for input(s): "AMMONIA" in the last 168 hours. Coagulation profile No results for input(s): "INR", "PROTIME" in the last 168 hours.  CBC: Recent Labs  Lab 05/10/23 0922 05/11/23 0443 05/13/23 0440 05/14/23 0448 05/15/23 0448  WBC 17.9* 11.4* 6.9 7.3 11.3*  NEUTROABS 15.3*  --   --   --  8.8*  HGB 12.4 13.0 12.5 12.9 12.7  HCT 37.0 38.6 37.2 39.4 37.4  MCV 95.4 93.0 92.8 95.2 94.9  PLT 219 227 213 247 233   Cardiac Enzymes: No results for input(s): "CKTOTAL", "CKMB", "CKMBINDEX", "TROPONINI" in the last 168 hours. BNP (last 3 results) No results for input(s): "PROBNP" in the last 8760 hours. CBG: Recent Labs  Lab 05/14/23 1243 05/14/23 1615 05/14/23 2139 05/15/23 0724 05/15/23 1137  GLUCAP 189* 279* 180* 135* 193*   D-Dimer: No results for input(s): "DDIMER" in the last 72 hours. Hgb A1c: No results for input(s): "HGBA1C" in the last 72 hours. Lipid Profile: No results for input(s): "CHOL", "HDL", "LDLCALC", "TRIG", "CHOLHDL", "LDLDIRECT" in the last 72 hours. Thyroid function studies: No results for input(s): "TSH", "T4TOTAL", "T3FREE", "THYROIDAB" in the last 72 hours.  Invalid input(s): "FREET3" Anemia work  up: No results for input(s): "VITAMINB12", "FOLATE", "FERRITIN", "TIBC", "IRON", "RETICCTPCT" in the last 72 hours. Sepsis Labs: Recent Labs  Lab 05/09/23 2126 05/09/23 2327 05/10/23 0922 05/10/23 1104 05/11/23 0443 05/13/23 0440 05/14/23 0448 05/15/23 0448  WBC  --   --  17.9*  --  11.4* 6.9 7.3 11.3*  LATICACIDVEN 2.8* 2.4* 1.0 1.0  --   --   --   --     Microbiology Recent Results (from the past 240 hours)  Resp panel by RT-PCR (RSV, Flu A&B, Covid) Anterior Nasal Swab     Status: None   Collection Time: 05/09/23  2:40 PM   Specimen: Anterior Nasal Swab  Result Value Ref Range Status   SARS Coronavirus 2 by RT PCR NEGATIVE NEGATIVE Final    Comment: (NOTE) SARS-CoV-2 target nucleic acids are NOT DETECTED.  The SARS-CoV-2 RNA is generally detectable in upper respiratory specimens during the acute phase of infection. The lowest concentration of SARS-CoV-2 viral copies this assay can detect is 138 copies/mL. A negative result does not preclude SARS-Cov-2 infection and should not be used as the sole basis for treatment or other patient management decisions. A negative result may occur with  improper specimen collection/handling, submission of specimen other than nasopharyngeal swab, presence of viral mutation(s) within the areas targeted by this assay, and inadequate number of viral copies(<138 copies/mL). A negative result must be combined with clinical observations, patient history, and epidemiological information. The expected result is Negative.  Fact Sheet for Patients:  BloggerCourse.com  Fact Sheet for Healthcare Providers:  SeriousBroker.it  This test is no  t yet approved or cleared by the [place] and  has been authorized for detection and/or diagnosis of SARS-CoV-2 by FDA under an Emergency Use Authorization (EUA). This EUA will remain  in effect (meaning this test can be used) for the duration of  the COVID-19 declaration under Section 564(b)(1) of the Act, 21 U.S.C.section 360bbb-3(b)(1), unless the authorization is terminated  or revoked sooner.       Influenza A by PCR NEGATIVE NEGATIVE Final   Influenza B by PCR NEGATIVE NEGATIVE Final    Comment: (NOTE) The Xpert Xpress SARS-CoV-2/FLU/RSV plus assay is intended as an aid in the diagnosis of influenza from Nasopharyngeal swab specimens and should not be used as a sole basis for treatment. Nasal washings and aspirates are unacceptable for Xpert Xpress SARS-CoV-2/FLU/RSV testing.  Fact Sheet for Patients: BloggerCourse.com  Fact Sheet for Healthcare Providers: SeriousBroker.it  This test is not yet approved or cleared by the [place] FDA and has been authorized for detection and/or diagnosis of SARS-CoV-2 by FDA under an Emergency Use Authorization (EUA). This EUA will remain in effect (meaning this test can be used) for the duration of the COVID-19 declaration under Section 564(b)(1) of the Act, 21 U.S.C. section 360bbb-3(b)(1), unless the authorization is terminated or revoked.     Resp Syncytial Virus by PCR NEGATIVE NEGATIVE Final    Comment: (NOTE) Fact Sheet for Patients: BloggerCourse.com  Fact Sheet for Healthcare Providers: SeriousBroker.it  This test is not yet approved or cleared by the [place] FDA and has been authorized for detection and/or diagnosis of SARS-CoV-2 by FDA under an Emergency Use Authorization (EUA). This EUA will remain in effect (meaning this test can be used) for the duration of the COVID-19 declaration under Section 564(b)(1) of the Act, 21 U.S.C. section 360bbb-3(b)(1), unless the authorization is terminated or revoked.  Performed at [facility], [address] [place], [place] [address]   Blood Culture (routine x 2)     Status: None   Collection Time:  05/09/23  8:15 PM   Specimen: BLOOD  Result Value Ref Range Status   Specimen Description BLOOD LEFT [facility]  Final   Special Requests   Final    BOTTLES DRAWN AEROBIC AND ANAEROBIC Blood Culture adequate volume   Culture   Final    NO GROWTH 5 DAYS Performed at [facility], [address] [place], [place] [address]    Report Status 05/14/2023 FINAL  Final  Blood Culture ID Panel (Reflexed)     Status: Abnormal   Collection Time: 05/09/23  8:15 PM  Result Value Ref Range Status   Enterococcus faecalis NOT DETECTED NOT DETECTED Final   Enterococcus Faecium NOT DETECTED NOT DETECTED Final   Listeria monocytogenes NOT DETECTED NOT DETECTED Final   Staphylococcus species NOT DETECTED NOT DETECTED Final   Staphylococcus aureus (BCID) NOT DETECTED NOT DETECTED Final   Staphylococcus epidermidis NOT DETECTED NOT DETECTED Final   Staphylococcus lugdunensis NOT DETECTED NOT DETECTED Final   Streptococcus species NOT DETECTED NOT DETECTED Final   Streptococcus agalactiae NOT DETECTED NOT DETECTED Final   Streptococcus pneumoniae NOT DETECTED NOT DETECTED Final   Streptococcus pyogenes NOT DETECTED NOT DETECTED Final   A.calcoaceticus-baumannii NOT DETECTED NOT DETECTED Final   Bacteroides fragilis NOT DETECTED NOT DETECTED Final   Enterobacterales DETECTED (A) NOT DETECTED Final    Comment: Enterobacterales represent a large order of gram negative bacteria, not a single organism.   Enterobacter cloacae complex NOT DETECTED NOT DETECTED Final  Escherichia coli DETECTED (A) NOT DETECTED Final    Comment: CRITICAL RESULT CALLED TO, READ BACK BY AND VERIFIED WITH: MERRILL, K. 2034 05/11/23 LRL    Klebsiella aerogenes NOT DETECTED NOT DETECTED Final   Klebsiella oxytoca NOT DETECTED NOT DETECTED Final   Klebsiella pneumoniae NOT DETECTED NOT DETECTED Final   Proteus species NOT DETECTED NOT DETECTED Final   Salmonella species NOT DETECTED NOT DETECTED Final   Serratia marcescens NOT  DETECTED NOT DETECTED Final   Haemophilus influenzae NOT DETECTED NOT DETECTED Final   Neisseria meningitidis NOT DETECTED NOT DETECTED Final   Pseudomonas aeruginosa NOT DETECTED NOT DETECTED Final   Stenotrophomonas maltophilia NOT DETECTED NOT DETECTED Final   Candida albicans NOT DETECTED NOT DETECTED Final   Candida auris NOT DETECTED NOT DETECTED Final   Candida glabrata NOT DETECTED NOT DETECTED Final   Candida krusei NOT DETECTED NOT DETECTED Final   Candida parapsilosis NOT DETECTED NOT DETECTED Final   Candida tropicalis NOT DETECTED NOT DETECTED Final   Cryptococcus neoformans/gattii NOT DETECTED NOT DETECTED Final   CTX-M ESBL DETECTED (A) NOT DETECTED Final    Comment: CRITICAL RESULT CALLED TO, READ BACK BY AND VERIFIED WITH: MERRILL, K. 2034 05/11/23 LRL (NOTE) Extended spectrum beta-lactamase detected. Recommend a carbapenem as initial therapy.      Carbapenem resistance IMP NOT DETECTED NOT DETECTED Final   Carbapenem resistance KPC NOT DETECTED NOT DETECTED Final   Carbapenem resistance NDM NOT DETECTED NOT DETECTED Final   Carbapenem resist OXA 48 LIKE NOT DETECTED NOT DETECTED Final   Carbapenem resistance VIM NOT DETECTED NOT DETECTED Final    Comment: Performed at [facility], [address] [place], [place] [address]  Blood Culture (routine x 2)     Status: Abnormal   Collection Time: 05/09/23  8:31 PM   Specimen: BLOOD  Result Value Ref Range Status   Specimen Description   Final    BLOOD RIGHT FA Performed at [facility], [address] [place], [place] [address]    Special Requests   Final    BOTTLES DRAWN AEROBIC AND ANAEROBIC Blood Culture results may not be optimal due to an inadequate volume of blood received in culture bottles Performed at [facility], [address], [place], [place] [address]    Culture  Setup Time   Final    GRAM NEGATIVE RODS AEROBIC BOTTLE ONLY Organism ID to follow CRITICAL RESULT  CALLED TO, READ BACK BY AND VERIFIED WITH: MERRILL, K. 2034 05/11/23 LRL Performed at [facility], [address] [place], [place] [address]    Culture (A)  Final    ESCHERICHIA COLI Confirmed Extended Spectrum Beta-Lactamase Producer (ESBL).  In bloodstream infections from ESBL organisms, carbapenems are preferred over piperacillin/tazobactam. They are shown to have a lower risk of mortality.    Report Status 05/14/2023 FINAL  Final   Organism ID, Bacteria ESCHERICHIA COLI  Final      Susceptibility   Escherichia coli - MIC*    AMPICILLIN >=32 RESISTANT Resistant     CEFEPIME 16 RESISTANT Resistant     CEFTAZIDIME RESISTANT Resistant     CEFTRIAXONE >=64 RESISTANT Resistant     CIPROFLOXACIN >=4 RESISTANT Resistant     GENTAMICIN <=1 SENSITIVE Sensitive     IMIPENEM <=0.25 SENSITIVE Sensitive     TRIMETH/SULFA <=20 SENSITIVE Sensitive     AMPICILLIN/SULBACTAM 8 SENSITIVE Sensitive     PIP/TAZO <=4 SENSITIVE Sensitive ug/mL    * ESCHERICHIA COLI  Procedures and diagnostic studies:  No results found.              LOS: 5 days   Infant Zink  [facility]   Pager on [email or web address]. If 7PM-7AM, please contact night-coverage at [email or web address]     05/15/2023, 1:34 PM

## 2023-05-15 NOTE — Plan of Care (Signed)
  Problem: Coping: Goal: Ability to adjust to condition or change in health will improve Outcome: Progressing   Problem: Education: Goal: Knowledge of General Education information will improve Description: Including pain rating scale, medication(s)/side effects and non-pharmacologic comfort measures Outcome: Progressing   Problem: Coping: Goal: Level of anxiety will decrease Outcome: Progressing   Problem: Safety: Goal: Ability to remain free from injury will improve Outcome: Progressing

## 2023-05-15 NOTE — Discharge Instructions (Addendum)
 Surgery Discharge Instructions: 1.  Patient may shower, but do not scrub wounds heavily and dab dry only. 2.  Do not submerge wounds in pool/tub until fully healed. 3.  Do not apply ointments or hydrogen peroxide to the wounds. 4.  May apply ice packs to the wounds for comfort. 5.  Regular home diet as tolerated. 6.  Do not drive while taking narcotics for pain control.  Prior to driving, make sure you are able to rotate right and left to look at blindspots without significant pain or discomfort. 7.  No heavy lifting or pushing of more than 10-15 lbs for 4 weeks.  the [facility] offers a [employer] that anyone can call toll free at [phone]. The friendship line is available 24 hours a day  [employer] is a Program of All-inclusive Care for the Elderly (PACE). Their mission is to promote and sustain the independence of seniors wishing to remain in the community. They provide seniors with comprehensive long-term health, social, medical and dietary care. Their program is a safe alternative to nursing home care. [phone]  [facility] Address [facility] [address] [place], [place] [address] Phone: [phone]. . Online zoom yoga class, connect with others without leaving your home [facility] offers [place] dance cardio sessions for individuals via Zoom. This program provides: - Dance fitness activities Please contact program for more information. Servinganyone in need adults 18+ hiv/aids individuals families Call [phone]  Email [email or web address] to get more info  [employer] offers an online [employer] to individuals where they can receive help to focus on their best health. Whether you're a [employer] member or not, the neighborhood center offers a... Main Serviceshealth education  exercise & fitness  community support services  recreation  virtual support Other Servicessupport groups Servinganyone  in need adults young adults teens seniors individuals families [email or web address] to get more info  Schedule on their website  The [facility] offers an array of activities for adults age 58 and over. This program provides:- Fitness and health programs- Tech classes- Activity books Main Serviceshealth education  community support services  exercise & fitness  recreation  more education Servingseniors  Call [phone]    For more resources go online to [email or web address].[email or web address] and type in you zipcode

## 2023-05-15 NOTE — Plan of Care (Signed)
  Problem: Coping: Goal: Ability to adjust to condition or change in health will improve Outcome: Progressing   Problem: Fluid Volume: Goal: Ability to maintain a balanced intake and output will improve Outcome: Progressing   Problem: Education: Goal: Individualized Educational Video(s) Outcome: Progressing   Problem: Metabolic: Goal: Ability to maintain appropriate glucose levels will improve Outcome: Progressing   Problem: Nutritional: Goal: Maintenance of adequate nutrition will improve Outcome: Progressing Goal: Progress toward achieving an optimal weight will improve Outcome: Progressing   Problem: Skin Integrity: Goal: Risk for impaired skin integrity will decrease Outcome: Progressing   Problem: Tissue Perfusion: Goal: Adequacy of tissue perfusion will improve Outcome: Progressing   Problem: Education: Goal: Knowledge of General Education information will improve Description: Including pain rating scale, medication(s)/side effects and non-pharmacologic comfort measures Outcome: Progressing

## 2023-05-15 NOTE — Progress Notes (Signed)
 [facility] SURGICAL PROGRESS NOTE  Hospital Day(s): 5.   Post op day(s): 1 Day Post-Op.   Interval History:  Patient seen and examined No acute events or new complaints overnight.  Patient reports she feels well Abdomen is sore No fever, chills, nausea, emesis Slight bump in WBC this AM to 11.3K; likely reactive from surgery Hgb to 12.7 Renal function normal; sCr - 0.57; UO - unmeasured ALT/AST improving further Bilirubin normal at 0.9 CLD; tolerating - reports she is hungry   Vital signs in last 24 hours: [min-max] current  Temp:  [97.6 F (36.4 C)-99.1 F (37.3 C)] 98.5 F (36.9 C) (03/28 0443) Pulse Rate:  [62-83] 80 (03/28 0443) Resp:  [10-20] 20 (03/28 0443) BP: (107-170)/(54-71) 135/71 (03/28 0443) SpO2:  [89 %-100 %] 98 % (03/28 0443)     Height: 5' 4.02" (162.6 cm) Weight: 90.1 kg BMI (Calculated): 34.08   Intake/Output last 2 shifts:  03/27 0701 - 03/28 0700 In: 3169.4 [P.O.:800; I.V.:2169.4; IV Piggyback:200] Out: 15 [Blood:15]   Physical Exam:  Constitutional: alert, cooperative and no distress  Respiratory: breathing non-labored at rest  Cardiovascular: regular rate and sinus rhythm  Gastrointestinal: soft, non-tender, and non-distended Integumentary: laparoscopic incisions are CDI with dermabond, no erythema or drainage   Labs:     Latest Ref Rng & Units 05/15/2023    4:48 AM 05/14/2023    4:48 AM 05/13/2023    4:40 AM  CBC  WBC 4.0 - 10.5 K/uL 11.3  7.3  6.9   Hemoglobin 12.0 - 15.0 g/dL 16.1  09.6  04.5   Hematocrit 36.0 - 46.0 % 37.4  39.4  37.2   Platelets 150 - 400 K/uL 233  247  213       Latest Ref Rng & Units 05/15/2023    4:48 AM 05/14/2023    4:48 AM 05/13/2023    4:40 AM  CMP  Glucose 70 - 99 mg/dL 409  811  914   BUN 8 - 23 mg/dL 11  8  12    Creatinine 0.44 - 1.00 mg/dL 7.82  9.56  2.13   Sodium 135 - 145 mmol/L 135  138  136   Potassium 3.5 - 5.1 mmol/L 4.5  4.1  4.1   Chloride 98 - 111 mmol/L 104  103  109   CO2  22 - 32 mmol/L 23  26  24    Calcium 8.9 - 10.3 mg/dL 8.7  8.7  8.4   Total Protein 6.5 - 8.1 g/dL 6.0  6.2  5.9   Total Bilirubin 0.0 - 1.2 mg/dL 0.9  1.3  2.3   Alkaline Phos 38 - 126 U/L 175  206  146   AST 15 - 41 U/L 79  69  68   ALT 0 - 44 U/L 82  93  100      Imaging studies: No new pertinent imaging studies   Assessment/Plan:  83 y.o. female 1 Day Post-Op s/p robotic assisted laparoscopic cholecystectomy for choledocholithiasis and cholangitis, complicated by pertinent comorbidities including psychiatric history .   - Okay to advance diet as tolerated - Continue IV Abx (Meropenem); pending repeat BCx - Monitor abdominal examination - Monitor hyperbilirubinemia; resolved             - Mobilize as tolerated             - Further management per primary service                 -  Discharge Pending: Okay to advance diet as tolerated, otherwise nothing further from surgical perspective. We will follow up in ~2 weeks as outpatient.    All of the above findings and recommendations were discussed with the patient, and the medical team, and all of patient's questions were answered to her expressed satisfaction.  -- Lynden Oxford, PA-C [facility] 05/15/2023, 7:14 AM M-F: 7am - 4pm

## 2023-05-15 NOTE — Care Management Important Message (Signed)
 Important Message  Patient Details  Name: Dawn [name] MRN: [id number] Date of Birth: [birth year]   Important Message Given:  Yes - Medicare IM  Patient is under precautions so gave the IM to unit secretary to pass on to covering nurse to deliver.  SCS   Anmol Paschen, Stephan Minister 05/15/2023, 10:32 AM

## 2023-05-15 NOTE — TOC Progression Note (Addendum)
 Transition of Care [facility]) - Progression Note    Patient Details  Name: Dawn [name] MRN: [id number] Date of Birth: 09/09/1940  Transition of Care [facility] CM/SW Contact  Chapman Fitch, RN Phone Number: 05/15/2023, 9:51 AM  Clinical Narrative:      Message sent to MD to determine if patient is ready for discharge back to her memory care   Notified by Candelaria Stagers that patient would like to switch facilities that she lives.  Met with patient at bedside yesterday.  She states "I'm surrounded by a bunch of people that have dementia and there is not one for me to talk to there".  Patient does not elaborate on any other concerns. She states that she would like to move to an apartment instead. I notified patient that since she is paying privately to live at [facility], she and her family would have to purse another facility/apartment.    Update: Per MD anticipated patient will medically be appropriate to return to memory care on Monday.  Tammy at [facility] notified   Expected Discharge Plan and Services                                               Social Determinants of Health (SDOH) Interventions SDOH Screenings   Food Insecurity: No Food Insecurity (05/10/2023)  Housing: Low Risk  (05/10/2023)  Transportation Needs: No Transportation Needs (05/10/2023)  Utilities: Not At Risk (05/10/2023)  Alcohol Screen: Low Risk  (07/22/2021)  Depression (PHQ2-9): Low Risk  (11/25/2021)  Recent Concern: Depression (PHQ2-9) - High Risk (11/05/2021)  Social Connections: Socially Isolated (05/10/2023)  Stress: Stress Concern Present (07/22/2021)  Tobacco Use: Unknown (05/12/2023)    Readmission Risk Interventions     No data to display

## 2023-05-16 DIAGNOSIS — K8309 Other cholangitis: Secondary | ICD-10-CM | POA: Diagnosis not present

## 2023-05-16 LAB — GLUCOSE, CAPILLARY
Glucose-Capillary: 129 mg/dL — ABNORMAL HIGH (ref 70–99)
Glucose-Capillary: 168 mg/dL — ABNORMAL HIGH (ref 70–99)
Glucose-Capillary: 157 mg/dL — ABNORMAL HIGH (ref 70–99)
Glucose-Capillary: 178 mg/dL — ABNORMAL HIGH (ref 70–99)
Glucose-Capillary: 139 mg/dL — ABNORMAL HIGH (ref 70–99)
Glucose-Capillary: 197 mg/dL — ABNORMAL HIGH (ref 70–99)

## 2023-05-16 NOTE — Plan of Care (Signed)

## 2023-05-16 NOTE — Progress Notes (Signed)
 Progress Note    Dawn [name]  ATF:[id number] DOB: 1940-07-30  DOA: 05/09/2023 PCP: [facility]      Brief Narrative:    Medical records reviewed and are as summarized below:  Dawn [name] is a 83 y.o. female with medical history significant of HTN, IIDM, SLE, rheumatoid arthritis, anxiety/depression/PTSD, presented with worsening of RUQ pain nauseous vomiting and fever.  Upon arriving to hospital, patient had a heart rate of 111, white cell count of 17.9.  Lactic acid 2.8.  MRCP showed dilated common bile duct, evidence of ascending cholangitis.  General surgery and GI consulted, patient was also placed on antibiotics.      Assessment/Plan:   Principal Problem:   Acute cholangitis Active Problems:   Hypertension   PTSD (post-traumatic stress disorder)   Fibromyalgia   Type 2 diabetes mellitus with diabetic neuropathy, unspecified (HCC)   Paranoia (HCC)   Ascending cholangitis   Cholelithiasis with choledocholithiasis   Dementia with behavioral disturbance (HCC)   Severe sepsis (HCC)   E coli bacteremia   Umbilical hernia without obstruction and without gangrene     Body mass index is 34.08 kg/m.   Severe sepsis secondary to ascending cholangitis. Choledocholithiasis with ascending cholangitis. E. coli bacteremia Blood culture showed ESBL E. coli.  Continue meropenem through 05/17/2023 to complete 7 days of treatment.  Patient is concerned about using Bactrim because of reported "allergic to sulfa".  S/p laparoscopic cholecystectomy on 05/14/2023.   Analgesics as needed for pain. Outpatient follow-up with general surgeon in 2 weeks    Dementia with behavioral disturbance. PTSD, paranoia. Stable.  No acute issues. Continue Risperdal, venlafaxine, gabapentin and BuSpar   Essential hypertension. Continue amlodipine    Type 2 diabetes. Continue sliding scale insulin.    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 Weight loss advised              Diet Order             Diet heart healthy/carb modified Room service appropriate? Yes; Fluid consistency: Thin  Diet effective now                            Consultants: Gastroenterologist General surgeon  Procedures: ERCP on 05/12/2023    Medications:    amLODipine  5 mg Oral Daily   busPIRone  15 mg Oral TID   enoxaparin (LOVENOX) injection  40 mg Subcutaneous Q24H   gabapentin  100 mg Oral TID   insulin aspart  0-15 Units Subcutaneous TID WC   loratadine  10 mg Oral Daily   pantoprazole  40 mg Oral Daily   risperiDONE  0.25 mg Oral Daily   sodium chloride flush  10-40 mL Intracatheter Q12H   venlafaxine XR  150 mg Oral Q breakfast   venlafaxine XR  75 mg Oral QHS   Continuous Infusions:  lactated ringers 75 mL/hr at 05/16/23 0200   meropenem (MERREM) IV 1 g (05/16/23 2706)     Anti-infectives (From admission, onward)    Start     Dose/Rate Route Frequency Ordered Stop   05/14/23 0800  ceFAZolin (ANCEF) IVPB 2g/100 mL premix        2 g 200 mL/hr over 30 Minutes Intravenous On call to O.R. 05/14/23 0705 05/14/23 1055   05/11/23 2200  meropenem (MERREM) 1 g in sodium chloride 0.9 % 100 mL IVPB        1 g 200 mL/hr  over 30 Minutes Intravenous Every 8 hours 05/11/23 2058     05/11/23 0900  ceFEPIme (MAXIPIME) 2 g in sodium chloride 0.9 % 100 mL IVPB  Status:  Discontinued        2 g 200 mL/hr over 30 Minutes Intravenous Every 12 hours 05/11/23 0755 05/11/23 2058   05/11/23 0845  ceFEPIme (MAXIPIME) 1 g in sodium chloride 0.9 % 100 mL IVPB  Status:  Discontinued        1 g 200 mL/hr over 30 Minutes Intravenous Every 8 hours 05/11/23 0747 05/11/23 0755   05/10/23 1130  metroNIDAZOLE (FLAGYL) IVPB 500 mg  Status:  Discontinued        500 mg 100 mL/hr over 60 Minutes Intravenous Every 12 hours 05/10/23 1020 05/11/23 2058   05/10/23 1100  cefTRIAXone (ROCEPHIN) 2 g in sodium chloride 0.9 % 100 mL IVPB  Status:  Discontinued        2 g 200  mL/hr over 30 Minutes Intravenous Every 24 hours 05/10/23 1020 05/11/23 0746   05/09/23 2115  cefTRIAXone (ROCEPHIN) 2 g in sodium chloride 0.9 % 100 mL IVPB        2 g 200 mL/hr over 30 Minutes Intravenous  Once 05/09/23 2105 05/09/23 2220   05/09/23 2115  metroNIDAZOLE (FLAGYL) IVPB 500 mg        500 mg 100 mL/hr over 60 Minutes Intravenous  Once 05/09/23 2105 05/09/23 2319              Family Communication/Anticipated D/C date and plan/Code Status   DVT prophylaxis: enoxaparin (LOVENOX) injection 40 mg Start: 05/15/23 1300 SCDs Start: 05/15/23 1054     Code Status: Full Code  Family Communication: None Disposition Plan: Plan to discharge to memory care unit   Status is: Inpatient Remains inpatient appropriate because: Cholangitis, E. coli bacteremia       Subjective:   Interval events noted.  She complains of abdominal pain.  Objective:    Vitals:   05/15/23 1450 05/15/23 2010 05/16/23 0349 05/16/23 0802  BP: (!) 131/50 139/71 104/60 137/71  Pulse: 77 80 73 80  Resp: 16 20 20 20   Temp:  (!) 97.5 F (36.4 C) 98.3 F (36.8 C) 97.8 F (36.6 C)  TempSrc:  Oral Oral Oral  SpO2: 97% 98% 97% 97%  Weight:      Height:       No data found.   Intake/Output Summary (Last 24 hours) at 05/16/2023 1117 Last data filed at 05/16/2023 0200 Gross per 24 hour  Intake 2599.41 ml  Output --  Net 2599.41 ml   Filed Weights   05/11/23 0751 05/12/23 0725  Weight: 90.4 kg 90.1 kg    Exam:    GEN: NAD SKIN: Warm and dry EYES: No pallor or icterus ENT: MMM CV: RRR PULM: CTA B ABD: soft, ND, mid abdominal tenderness without rebound tenderness or guarding, +BS, multiple surgical incisions are intact and dry CNS: AAO x 3, non focal EXT: No edema or tenderness    Data Reviewed:   I have personally reviewed following labs and imaging studies:  Labs: Labs show the following:   Basic Metabolic Panel: Recent Labs  Lab 05/11/23 0443 05/12/23 0414  05/13/23 0440 05/14/23 0448 05/15/23 0448  NA 137 137 136 138 135  K 4.2 3.9 4.1 4.1 4.5  CL 105 106 109 103 104  CO2 23 23 24 26 23   GLUCOSE 147* 131* 104* 145* 142*  BUN 9 11 12 8  11  CREATININE 0.62 0.70 0.59 0.66 0.57  CALCIUM 8.5* 8.5* 8.4* 8.7* 8.7*  MG  --  2.0 2.1  --   --    GFR Estimated Creatinine Clearance: 59 mL/min (by C-G formula based on SCr of 0.57 mg/dL). Liver Function Tests: Recent Labs  Lab 05/11/23 0443 05/12/23 0414 05/13/23 0440 05/14/23 0448 05/15/23 0448  AST 134* 61* 68* 69* 79*  ALT 192* 120* 100* 93* 82*  ALKPHOS 154* 126 146* 206* 175*  BILITOT 3.3* 1.4* 2.3* 1.3* 0.9  PROT 6.9 6.1* 5.9* 6.2* 6.0*  ALBUMIN 3.6 3.0* 2.9* 3.2* 3.1*   Recent Labs  Lab 05/09/23 1440  LIPASE 21   No results for input(s): "AMMONIA" in the last 168 hours. Coagulation profile No results for input(s): "INR", "PROTIME" in the last 168 hours.  CBC: Recent Labs  Lab 05/10/23 0922 05/11/23 0443 05/13/23 0440 05/14/23 0448 05/15/23 0448  WBC 17.9* 11.4* 6.9 7.3 11.3*  NEUTROABS 15.3*  --   --   --  8.8*  HGB 12.4 13.0 12.5 12.9 12.7  HCT 37.0 38.6 37.2 39.4 37.4  MCV 95.4 93.0 92.8 95.2 94.9  PLT 219 227 213 247 233   Cardiac Enzymes: No results for input(s): "CKTOTAL", "CKMB", "CKMBINDEX", "TROPONINI" in the last 168 hours. BNP (last 3 results) No results for input(s): "PROBNP" in the last 8760 hours. CBG: Recent Labs  Lab 05/15/23 1137 05/15/23 1627 05/15/23 2128 05/16/23 0800 05/16/23 0823  GLUCAP 193* 115* 170* 129* 168*   D-Dimer: No results for input(s): "DDIMER" in the last 72 hours. Hgb A1c: No results for input(s): "HGBA1C" in the last 72 hours. Lipid Profile: No results for input(s): "CHOL", "HDL", "LDLCALC", "TRIG", "CHOLHDL", "LDLDIRECT" in the last 72 hours. Thyroid function studies: No results for input(s): "TSH", "T4TOTAL", "T3FREE", "THYROIDAB" in the last 72 hours.  Invalid input(s): "FREET3" Anemia work up: No results  for input(s): "VITAMINB12", "FOLATE", "FERRITIN", "TIBC", "IRON", "RETICCTPCT" in the last 72 hours. Sepsis Labs: Recent Labs  Lab 05/09/23 2126 05/09/23 2327 05/10/23 0922 05/10/23 1104 05/11/23 0443 05/13/23 0440 05/14/23 0448 05/15/23 0448  WBC  --   --  17.9*  --  11.4* 6.9 7.3 11.3*  LATICACIDVEN 2.8* 2.4* 1.0 1.0  --   --   --   --     Microbiology Recent Results (from the past 240 hours)  Resp panel by RT-PCR (RSV, Flu A&B, Covid) Anterior Nasal Swab     Status: None   Collection Time: 05/09/23  2:40 PM   Specimen: Anterior Nasal Swab  Result Value Ref Range Status   SARS Coronavirus 2 by RT PCR NEGATIVE NEGATIVE Final    Comment: (NOTE) SARS-CoV-2 target nucleic acids are NOT DETECTED.  The SARS-CoV-2 RNA is generally detectable in upper respiratory specimens during the acute phase of infection. The lowest concentration of SARS-CoV-2 viral copies this assay can detect is 138 copies/mL. A negative result does not preclude SARS-Cov-2 infection and should not be used as the sole basis for treatment or other patient management decisions. A negative result may occur with  improper specimen collection/handling, submission of specimen other than nasopharyngeal swab, presence of viral mutation(s) within the areas targeted by this assay, and inadequate number of viral copies(<138 copies/mL). A negative result must be combined with clinical observations, patient history, and epidemiological information. The expected result is Negative.  Fact Sheet for Patients:  BloggerCourse.com  Fact Sheet for Healthcare Providers:  SeriousBroker.it  This test is no t yet approved or cleared by the  [place] [employer] and  has been authorized for detection and/or diagnosis of SARS-CoV-2 by FDA under an TEFL teacher (EUA). This EUA will remain  in effect (meaning this test can be used) for the duration of the COVID-19  declaration under Section 564(b)(1) of the Act, 21 U.S.C.section 360bbb-3(b)(1), unless the authorization is terminated  or revoked sooner.       Influenza A by PCR NEGATIVE NEGATIVE Final   Influenza B by PCR NEGATIVE NEGATIVE Final    Comment: (NOTE) The Xpert Xpress SARS-CoV-2/FLU/RSV plus assay is intended as an aid in the diagnosis of influenza from Nasopharyngeal swab specimens and should not be used as a sole basis for treatment. Nasal washings and aspirates are unacceptable for Xpert Xpress SARS-CoV-2/FLU/RSV testing.  Fact Sheet for Patients: BloggerCourse.com  Fact Sheet for Healthcare Providers: SeriousBroker.it  This test is not yet approved or cleared by the [place] FDA and has been authorized for detection and/or diagnosis of SARS-CoV-2 by FDA under an Emergency Use Authorization (EUA). This EUA will remain in effect (meaning this test can be used) for the duration of the COVID-19 declaration under Section 564(b)(1) of the Act, 21 U.S.C. section 360bbb-3(b)(1), unless the authorization is terminated or revoked.     Resp Syncytial Virus by PCR NEGATIVE NEGATIVE Final    Comment: (NOTE) Fact Sheet for Patients: BloggerCourse.com  Fact Sheet for Healthcare Providers: SeriousBroker.it  This test is not yet approved or cleared by the [place] FDA and has been authorized for detection and/or diagnosis of SARS-CoV-2 by FDA under an Emergency Use Authorization (EUA). This EUA will remain in effect (meaning this test can be used) for the duration of the COVID-19 declaration under Section 564(b)(1) of the Act, 21 U.S.C. section 360bbb-3(b)(1), unless the authorization is terminated or revoked.  Performed at [facility], [address] [place], [place] [address]   Blood Culture (routine x 2)     Status: None   Collection Time: 05/09/23  8:15 PM    Specimen: BLOOD  Result Value Ref Range Status   Specimen Description BLOOD LEFT [facility]  Final   Special Requests   Final    BOTTLES DRAWN AEROBIC AND ANAEROBIC Blood Culture adequate volume   Culture   Final    NO GROWTH 5 DAYS Performed at [facility], [address] [place], [place] [address]    Report Status 05/14/2023 FINAL  Final  Blood Culture ID Panel (Reflexed)     Status: Abnormal   Collection Time: 05/09/23  8:15 PM  Result Value Ref Range Status   Enterococcus faecalis NOT DETECTED NOT DETECTED Final   Enterococcus Faecium NOT DETECTED NOT DETECTED Final   Listeria monocytogenes NOT DETECTED NOT DETECTED Final   Staphylococcus species NOT DETECTED NOT DETECTED Final   Staphylococcus aureus (BCID) NOT DETECTED NOT DETECTED Final   Staphylococcus epidermidis NOT DETECTED NOT DETECTED Final   Staphylococcus lugdunensis NOT DETECTED NOT DETECTED Final   Streptococcus species NOT DETECTED NOT DETECTED Final   Streptococcus agalactiae NOT DETECTED NOT DETECTED Final   Streptococcus pneumoniae NOT DETECTED NOT DETECTED Final   Streptococcus pyogenes NOT DETECTED NOT DETECTED Final   A.calcoaceticus-baumannii NOT DETECTED NOT DETECTED Final   Bacteroides fragilis NOT DETECTED NOT DETECTED Final   Enterobacterales DETECTED (A) NOT DETECTED Final    Comment: Enterobacterales represent a large order of gram negative bacteria, not a single organism.   Enterobacter cloacae complex NOT DETECTED NOT DETECTED Final   Escherichia coli DETECTED (A) NOT  DETECTED Final    Comment: CRITICAL RESULT CALLED TO, READ BACK BY AND VERIFIED WITH: MERRILL, K. 2034 05/11/23 LRL    Klebsiella aerogenes NOT DETECTED NOT DETECTED Final   Klebsiella oxytoca NOT DETECTED NOT DETECTED Final   Klebsiella pneumoniae NOT DETECTED NOT DETECTED Final   Proteus species NOT DETECTED NOT DETECTED Final   Salmonella species NOT DETECTED NOT DETECTED Final   Serratia marcescens NOT DETECTED NOT  DETECTED Final   Haemophilus influenzae NOT DETECTED NOT DETECTED Final   Neisseria meningitidis NOT DETECTED NOT DETECTED Final   Pseudomonas aeruginosa NOT DETECTED NOT DETECTED Final   Stenotrophomonas maltophilia NOT DETECTED NOT DETECTED Final   Candida albicans NOT DETECTED NOT DETECTED Final   Candida auris NOT DETECTED NOT DETECTED Final   Candida glabrata NOT DETECTED NOT DETECTED Final   Candida krusei NOT DETECTED NOT DETECTED Final   Candida parapsilosis NOT DETECTED NOT DETECTED Final   Candida tropicalis NOT DETECTED NOT DETECTED Final   Cryptococcus neoformans/gattii NOT DETECTED NOT DETECTED Final   CTX-M ESBL DETECTED (A) NOT DETECTED Final    Comment: CRITICAL RESULT CALLED TO, READ BACK BY AND VERIFIED WITH: MERRILL, K. 2034 05/11/23 LRL (NOTE) Extended spectrum beta-lactamase detected. Recommend a carbapenem as initial therapy.      Carbapenem resistance IMP NOT DETECTED NOT DETECTED Final   Carbapenem resistance KPC NOT DETECTED NOT DETECTED Final   Carbapenem resistance NDM NOT DETECTED NOT DETECTED Final   Carbapenem resist OXA 48 LIKE NOT DETECTED NOT DETECTED Final   Carbapenem resistance VIM NOT DETECTED NOT DETECTED Final    Comment: Performed at [facility], [address] [place], [place] [address]  Blood Culture (routine x 2)     Status: Abnormal   Collection Time: 05/09/23  8:31 PM   Specimen: BLOOD  Result Value Ref Range Status   Specimen Description   Final    BLOOD RIGHT FA Performed at [facility], [address] [place], [place] [address]    Special Requests   Final    BOTTLES DRAWN AEROBIC AND ANAEROBIC Blood Culture results may not be optimal due to an inadequate volume of blood received in culture bottles Performed at [facility], [address], [place], [place] [address]    Culture  Setup Time   Final    GRAM NEGATIVE RODS AEROBIC BOTTLE ONLY Organism ID to follow CRITICAL RESULT CALLED TO, READ  BACK BY AND VERIFIED WITH: MERRILL, K. 2034 05/11/23 LRL Performed at [facility], [address], [place], [place] [address]    Culture (A)  Final    ESCHERICHIA COLI Confirmed Extended Spectrum Beta-Lactamase Producer (ESBL).  In bloodstream infections from ESBL organisms, carbapenems are preferred over piperacillin/tazobactam. They are shown to have a lower risk of mortality.    Report Status 05/14/2023 FINAL  Final   Organism ID, Bacteria ESCHERICHIA COLI  Final      Susceptibility   Escherichia coli - MIC*    AMPICILLIN >=32 RESISTANT Resistant     CEFEPIME 16 RESISTANT Resistant     CEFTAZIDIME RESISTANT Resistant     CEFTRIAXONE >=64 RESISTANT Resistant     CIPROFLOXACIN >=4 RESISTANT Resistant     GENTAMICIN <=1 SENSITIVE Sensitive     IMIPENEM <=0.25 SENSITIVE Sensitive     TRIMETH/SULFA <=20 SENSITIVE Sensitive     AMPICILLIN/SULBACTAM 8 SENSITIVE Sensitive     PIP/TAZO <=4 SENSITIVE Sensitive ug/mL    * ESCHERICHIA COLI    Procedures and diagnostic  studies:  No results found.              LOS: 6 days   Roderica Cathell  [facility]   Pager on [email or web address]. If 7PM-7AM, please contact night-coverage at [email or web address]     05/16/2023, 11:17 AM

## 2023-05-17 DIAGNOSIS — K8309 Other cholangitis: Secondary | ICD-10-CM | POA: Diagnosis not present

## 2023-05-17 LAB — GLUCOSE, CAPILLARY
Glucose-Capillary: 111 mg/dL — ABNORMAL HIGH (ref 70–99)
Glucose-Capillary: 182 mg/dL — ABNORMAL HIGH (ref 70–99)
Glucose-Capillary: 168 mg/dL — ABNORMAL HIGH (ref 70–99)
Glucose-Capillary: 210 mg/dL — ABNORMAL HIGH (ref 70–99)

## 2023-05-17 MED ORDER — POLYETHYLENE GLYCOL 3350 17 G PO PACK
17.0000 g | PACK | Freq: Every day | ORAL | Status: DC | PRN
  Filled 2023-05-17: qty 1

## 2023-05-17 NOTE — Progress Notes (Signed)
 Progress Note    Dawn [name]  ZOX:[id number] DOB: Mar 07, 1940  DOA: 05/09/2023 PCP: [facility]      Brief Narrative:    Medical records reviewed and are as summarized below:  Dawn [name] is a 83 y.o. female with medical history significant of HTN, IIDM, SLE, rheumatoid arthritis, anxiety/depression/PTSD, presented with worsening of RUQ pain nauseous vomiting and fever.  Upon arriving to hospital, patient had a heart rate of 111, white cell count of 17.9.  Lactic acid 2.8.  MRCP showed dilated common bile duct, evidence of ascending cholangitis.  General surgery and GI consulted, patient was also placed on antibiotics.      Assessment/Plan:   Principal Problem:   Acute cholangitis Active Problems:   Hypertension   PTSD (post-traumatic stress disorder)   Fibromyalgia   Type 2 diabetes mellitus with diabetic neuropathy, unspecified (HCC)   Paranoia (HCC)   Ascending cholangitis   Cholelithiasis with choledocholithiasis   Dementia with behavioral disturbance (HCC)   Severe sepsis (HCC)   E coli bacteremia   Umbilical hernia without obstruction and without gangrene     Body mass index is 34.08 kg/m.   Severe sepsis secondary to ascending cholangitis. Choledocholithiasis with ascending cholangitis. E. coli bacteremia Blood culture showed ESBL E. coli.   Plan to complete 7 days of IV meropenem today. Patient is concerned about using Bactrim because of reported "allergic to sulfa".  S/p laparoscopic cholecystectomy on 05/14/2023.   Analgesics as needed for pain. Outpatient follow-up with general surgeon in 2 weeks    Dementia with behavioral disturbance. PTSD, paranoia. Stable.  No acute issues. Continue Risperdal, venlafaxine, gabapentin and BuSpar   Essential hypertension. Continue amlodipine    Type 2 diabetes. Continue sliding scale insulin.    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 Weight loss advised             Diet Order              Diet heart healthy/carb modified Room service appropriate? Yes; Fluid consistency: Thin  Diet effective now                            Consultants: Gastroenterologist General surgeon  Procedures: ERCP on 05/12/2023    Medications:    amLODipine  5 mg Oral Daily   busPIRone  15 mg Oral TID   enoxaparin (LOVENOX) injection  40 mg Subcutaneous Q24H   gabapentin  100 mg Oral TID   insulin aspart  0-15 Units Subcutaneous TID WC   loratadine  10 mg Oral Daily   pantoprazole  40 mg Oral Daily   risperiDONE  0.25 mg Oral Daily   sodium chloride flush  10-40 mL Intracatheter Q12H   venlafaxine XR  150 mg Oral Q breakfast   venlafaxine XR  75 mg Oral QHS   Continuous Infusions:  meropenem (MERREM) IV 1 g (05/17/23 8119)     Anti-infectives (From admission, onward)    Start     Dose/Rate Route Frequency Ordered Stop   05/14/23 0800  ceFAZolin (ANCEF) IVPB 2g/100 mL premix        2 g 200 mL/hr over 30 Minutes Intravenous On call to O.R. 05/14/23 0705 05/14/23 1055   05/11/23 2200  meropenem (MERREM) 1 g in sodium chloride 0.9 % 100 mL IVPB        1 g 200 mL/hr over 30 Minutes Intravenous Every 8 hours 05/11/23 2058  05/11/23 0900  ceFEPIme (MAXIPIME) 2 g in sodium chloride 0.9 % 100 mL IVPB  Status:  Discontinued        2 g 200 mL/hr over 30 Minutes Intravenous Every 12 hours 05/11/23 0755 05/11/23 2058   05/11/23 0845  ceFEPIme (MAXIPIME) 1 g in sodium chloride 0.9 % 100 mL IVPB  Status:  Discontinued        1 g 200 mL/hr over 30 Minutes Intravenous Every 8 hours 05/11/23 0747 05/11/23 0755   05/10/23 1130  metroNIDAZOLE (FLAGYL) IVPB 500 mg  Status:  Discontinued        500 mg 100 mL/hr over 60 Minutes Intravenous Every 12 hours 05/10/23 1020 05/11/23 2058   05/10/23 1100  cefTRIAXone (ROCEPHIN) 2 g in sodium chloride 0.9 % 100 mL IVPB  Status:  Discontinued        2 g 200 mL/hr over 30 Minutes Intravenous Every 24 hours 05/10/23 1020 05/11/23  0746   05/09/23 2115  cefTRIAXone (ROCEPHIN) 2 g in sodium chloride 0.9 % 100 mL IVPB        2 g 200 mL/hr over 30 Minutes Intravenous  Once 05/09/23 2105 05/09/23 2220   05/09/23 2115  metroNIDAZOLE (FLAGYL) IVPB 500 mg        500 mg 100 mL/hr over 60 Minutes Intravenous  Once 05/09/23 2105 05/09/23 2319              Family Communication/Anticipated D/C date and plan/Code Status   DVT prophylaxis: enoxaparin (LOVENOX) injection 40 mg Start: 05/15/23 1300 SCDs Start: 05/15/23 1054     Code Status: Full Code  Family Communication: None Disposition Plan: Plan to discharge to memory care unit   Status is: Inpatient Remains inpatient appropriate because: Cholangitis, E. coli bacteremia       Subjective:   Interval events noted.  She complains of mild abdominal pain.  No other complaints.  Objective:    Vitals:   05/16/23 1506 05/16/23 2117 05/17/23 0504 05/17/23 0811  BP: (!) 146/66 127/62 (!) 155/88 126/77  Pulse: 73 72 71 71  Resp:  18 18 18   Temp: 98 F (36.7 C) 97.9 F (36.6 C) 97.7 F (36.5 C) 97.8 F (36.6 C)  TempSrc: Oral Oral Oral Oral  SpO2: 99% 98% 99% 98%  Weight:      Height:       No data found.   Intake/Output Summary (Last 24 hours) at 05/17/2023 1219 Last data filed at 05/17/2023 0900 Gross per 24 hour  Intake 1783.68 ml  Output --  Net 1783.68 ml   Filed Weights   05/11/23 0751 05/12/23 0725  Weight: 90.4 kg 90.1 kg    Exam:  GEN: NAD SKIN: Warm and dry EYES: No pallor or icterus ENT: MMM CV: RRR PULM: CTA B ABD: soft, mild surgical tenderness, NT, +BS CNS: AAO x 3, non focal EXT: No edema or tenderness     Data Reviewed:   I have personally reviewed following labs and imaging studies:  Labs: Labs show the following:   Basic Metabolic Panel: Recent Labs  Lab 05/11/23 0443 05/12/23 0414 05/13/23 0440 05/14/23 0448 05/15/23 0448  NA 137 137 136 138 135  K 4.2 3.9 4.1 4.1 4.5  CL 105 106 109 103 104   CO2 23 23 24 26 23   GLUCOSE 147* 131* 104* 145* 142*  BUN 9 11 12 8 11   CREATININE 0.62 0.70 0.59 0.66 0.57  CALCIUM 8.5* 8.5* 8.4* 8.7* 8.7*  MG  --  2.0 2.1  --   --    GFR Estimated Creatinine Clearance: 59 mL/min (by C-G formula based on SCr of 0.57 mg/dL). Liver Function Tests: Recent Labs  Lab 05/11/23 0443 05/12/23 0414 05/13/23 0440 05/14/23 0448 05/15/23 0448  AST 134* 61* 68* 69* 79*  ALT 192* 120* 100* 93* 82*  ALKPHOS 154* 126 146* 206* 175*  BILITOT 3.3* 1.4* 2.3* 1.3* 0.9  PROT 6.9 6.1* 5.9* 6.2* 6.0*  ALBUMIN 3.6 3.0* 2.9* 3.2* 3.1*   No results for input(s): "LIPASE", "AMYLASE" in the last 168 hours.  No results for input(s): "AMMONIA" in the last 168 hours. Coagulation profile No results for input(s): "INR", "PROTIME" in the last 168 hours.  CBC: Recent Labs  Lab 05/11/23 0443 05/13/23 0440 05/14/23 0448 05/15/23 0448  WBC 11.4* 6.9 7.3 11.3*  NEUTROABS  --   --   --  8.8*  HGB 13.0 12.5 12.9 12.7  HCT 38.6 37.2 39.4 37.4  MCV 93.0 92.8 95.2 94.9  PLT 227 213 247 233   Cardiac Enzymes: No results for input(s): "CKTOTAL", "CKMB", "CKMBINDEX", "TROPONINI" in the last 168 hours. BNP (last 3 results) No results for input(s): "PROBNP" in the last 8760 hours. CBG: Recent Labs  Lab 05/16/23 1224 05/16/23 1605 05/16/23 2116 05/17/23 0809 05/17/23 1203  GLUCAP 178* 139* 197* 111* 182*   D-Dimer: No results for input(s): "DDIMER" in the last 72 hours. Hgb A1c: No results for input(s): "HGBA1C" in the last 72 hours. Lipid Profile: No results for input(s): "CHOL", "HDL", "LDLCALC", "TRIG", "CHOLHDL", "LDLDIRECT" in the last 72 hours. Thyroid function studies: No results for input(s): "TSH", "T4TOTAL", "T3FREE", "THYROIDAB" in the last 72 hours.  Invalid input(s): "FREET3" Anemia work up: No results for input(s): "VITAMINB12", "FOLATE", "FERRITIN", "TIBC", "IRON", "RETICCTPCT" in the last 72 hours. Sepsis Labs: Recent Labs  Lab  05/11/23 0443 05/13/23 0440 05/14/23 0448 05/15/23 0448  WBC 11.4* 6.9 7.3 11.3*    Microbiology Recent Results (from the past 240 hours)  Resp panel by RT-PCR (RSV, Flu A&B, Covid) Anterior Nasal Swab     Status: None   Collection Time: 05/09/23  2:40 PM   Specimen: Anterior Nasal Swab  Result Value Ref Range Status   SARS Coronavirus 2 by RT PCR NEGATIVE NEGATIVE Final    Comment: (NOTE) SARS-CoV-2 target nucleic acids are NOT DETECTED.  The SARS-CoV-2 RNA is generally detectable in upper respiratory specimens during the acute phase of infection. The lowest concentration of SARS-CoV-2 viral copies this assay can detect is 138 copies/mL. A negative result does not preclude SARS-Cov-2 infection and should not be used as the sole basis for treatment or other patient management decisions. A negative result may occur with  improper specimen collection/handling, submission of specimen other than nasopharyngeal swab, presence of viral mutation(s) within the areas targeted by this assay, and inadequate number of viral copies(<138 copies/mL). A negative result must be combined with clinical observations, patient history, and epidemiological information. The expected result is Negative.  Fact Sheet for Patients:  BloggerCourse.com  Fact Sheet for Healthcare Providers:  SeriousBroker.it  This test is no t yet approved or cleared by the [place] FDA and  has been authorized for detection and/or diagnosis of SARS-CoV-2 by FDA under an Emergency Use Authorization (EUA). This EUA will remain  in effect (meaning this test can be used) for the duration of the COVID-19 declaration under Section 564(b)(1) of the Act, 21 U.S.C.section 360bbb-3(b)(1), unless the authorization is terminated  or revoked sooner.  Influenza A by PCR NEGATIVE NEGATIVE Final   Influenza B by PCR NEGATIVE NEGATIVE Final    Comment: (NOTE) The Xpert  Xpress SARS-CoV-2/FLU/RSV plus assay is intended as an aid in the diagnosis of influenza from Nasopharyngeal swab specimens and should not be used as a sole basis for treatment. Nasal washings and aspirates are unacceptable for Xpert Xpress SARS-CoV-2/FLU/RSV testing.  Fact Sheet for Patients: BloggerCourse.com  Fact Sheet for Healthcare Providers: SeriousBroker.it  This test is not yet approved or cleared by the [place] FDA and has been authorized for detection and/or diagnosis of SARS-CoV-2 by FDA under an Emergency Use Authorization (EUA). This EUA will remain in effect (meaning this test can be used) for the duration of the COVID-19 declaration under Section 564(b)(1) of the Act, 21 U.S.C. section 360bbb-3(b)(1), unless the authorization is terminated or revoked.     Resp Syncytial Virus by PCR NEGATIVE NEGATIVE Final    Comment: (NOTE) Fact Sheet for Patients: BloggerCourse.com  Fact Sheet for Healthcare Providers: SeriousBroker.it  This test is not yet approved or cleared by the [place] FDA and has been authorized for detection and/or diagnosis of SARS-CoV-2 by FDA under an Emergency Use Authorization (EUA). This EUA will remain in effect (meaning this test can be used) for the duration of the COVID-19 declaration under Section 564(b)(1) of the Act, 21 U.S.C. section 360bbb-3(b)(1), unless the authorization is terminated or revoked.  Performed at [facility], [address] [place], [place] [address]   Blood Culture (routine x 2)     Status: None   Collection Time: 05/09/23  8:15 PM   Specimen: BLOOD  Result Value Ref Range Status   Specimen Description BLOOD LEFT [facility]  Final   Special Requests   Final    BOTTLES DRAWN AEROBIC AND ANAEROBIC Blood Culture adequate volume   Culture   Final    NO GROWTH 5 DAYS Performed at [facility],  [address] [place], [place] [address]    Report Status 05/14/2023 FINAL  Final  Blood Culture ID Panel (Reflexed)     Status: Abnormal   Collection Time: 05/09/23  8:15 PM  Result Value Ref Range Status   Enterococcus faecalis NOT DETECTED NOT DETECTED Final   Enterococcus Faecium NOT DETECTED NOT DETECTED Final   Listeria monocytogenes NOT DETECTED NOT DETECTED Final   Staphylococcus species NOT DETECTED NOT DETECTED Final   Staphylococcus aureus (BCID) NOT DETECTED NOT DETECTED Final   Staphylococcus epidermidis NOT DETECTED NOT DETECTED Final   Staphylococcus lugdunensis NOT DETECTED NOT DETECTED Final   Streptococcus species NOT DETECTED NOT DETECTED Final   Streptococcus agalactiae NOT DETECTED NOT DETECTED Final   Streptococcus pneumoniae NOT DETECTED NOT DETECTED Final   Streptococcus pyogenes NOT DETECTED NOT DETECTED Final   A.calcoaceticus-baumannii NOT DETECTED NOT DETECTED Final   Bacteroides fragilis NOT DETECTED NOT DETECTED Final   Enterobacterales DETECTED (A) NOT DETECTED Final    Comment: Enterobacterales represent a large order of gram negative bacteria, not a single organism.   Enterobacter cloacae complex NOT DETECTED NOT DETECTED Final   Escherichia coli DETECTED (A) NOT DETECTED Final    Comment: CRITICAL RESULT CALLED TO, READ BACK BY AND VERIFIED WITH: MERRILL, K. 2034 05/11/23 LRL    Klebsiella aerogenes NOT DETECTED NOT DETECTED Final   Klebsiella oxytoca NOT DETECTED NOT DETECTED Final   Klebsiella pneumoniae NOT DETECTED NOT DETECTED Final   Proteus species NOT DETECTED NOT DETECTED Final   Salmonella species NOT DETECTED NOT DETECTED  Final   Serratia marcescens NOT DETECTED NOT DETECTED Final   Haemophilus influenzae NOT DETECTED NOT DETECTED Final   Neisseria meningitidis NOT DETECTED NOT DETECTED Final   Pseudomonas aeruginosa NOT DETECTED NOT DETECTED Final   Stenotrophomonas maltophilia NOT DETECTED NOT DETECTED Final   Candida albicans NOT  DETECTED NOT DETECTED Final   Candida auris NOT DETECTED NOT DETECTED Final   Candida glabrata NOT DETECTED NOT DETECTED Final   Candida krusei NOT DETECTED NOT DETECTED Final   Candida parapsilosis NOT DETECTED NOT DETECTED Final   Candida tropicalis NOT DETECTED NOT DETECTED Final   Cryptococcus neoformans/gattii NOT DETECTED NOT DETECTED Final   CTX-M ESBL DETECTED (A) NOT DETECTED Final    Comment: CRITICAL RESULT CALLED TO, READ BACK BY AND VERIFIED WITH: MERRILL, K. 2034 05/11/23 LRL (NOTE) Extended spectrum beta-lactamase detected. Recommend a carbapenem as initial therapy.      Carbapenem resistance IMP NOT DETECTED NOT DETECTED Final   Carbapenem resistance KPC NOT DETECTED NOT DETECTED Final   Carbapenem resistance NDM NOT DETECTED NOT DETECTED Final   Carbapenem resist OXA 48 LIKE NOT DETECTED NOT DETECTED Final   Carbapenem resistance VIM NOT DETECTED NOT DETECTED Final    Comment: Performed at [facility], [address] [place], [place] [address]  Blood Culture (routine x 2)     Status: Abnormal   Collection Time: 05/09/23  8:31 PM   Specimen: BLOOD  Result Value Ref Range Status   Specimen Description   Final    BLOOD RIGHT FA Performed at [facility], [address], [place], [place] [address]    Special Requests   Final    BOTTLES DRAWN AEROBIC AND ANAEROBIC Blood Culture results may not be optimal due to an inadequate volume of blood received in culture bottles Performed at [facility], [address], [place], [place] [address]    Culture  Setup Time   Final    GRAM NEGATIVE RODS AEROBIC BOTTLE ONLY Organism ID to follow CRITICAL RESULT CALLED TO, READ BACK BY AND VERIFIED WITH: MERRILL, K. 2034 05/11/23 LRL Performed at [facility], [address] [place], [place] [address]    Culture (A)  Final    ESCHERICHIA COLI Confirmed Extended Spectrum Beta-Lactamase Producer (ESBL).  In bloodstream infections from ESBL  organisms, carbapenems are preferred over piperacillin/tazobactam. They are shown to have a lower risk of mortality.    Report Status 05/14/2023 FINAL  Final   Organism ID, Bacteria ESCHERICHIA COLI  Final      Susceptibility   Escherichia coli - MIC*    AMPICILLIN >=32 RESISTANT Resistant     CEFEPIME 16 RESISTANT Resistant     CEFTAZIDIME RESISTANT Resistant     CEFTRIAXONE >=64 RESISTANT Resistant     CIPROFLOXACIN >=4 RESISTANT Resistant     GENTAMICIN <=1 SENSITIVE Sensitive     IMIPENEM <=0.25 SENSITIVE Sensitive     TRIMETH/SULFA <=20 SENSITIVE Sensitive     AMPICILLIN/SULBACTAM 8 SENSITIVE Sensitive     PIP/TAZO <=4 SENSITIVE Sensitive ug/mL    * ESCHERICHIA COLI    Procedures and diagnostic studies:  No results found.              LOS: 7 days   Philopateer Strine  [facility]   Pager on [email or web address]. If 7PM-7AM, please contact night-coverage at [email or web address]     05/17/2023, 12:19 PM

## 2023-05-18 DIAGNOSIS — K8309 Other cholangitis: Secondary | ICD-10-CM | POA: Diagnosis not present

## 2023-05-18 LAB — BASIC METABOLIC PANEL WITH GFR
Anion gap: 9 (ref 5–15)
BUN: 14 mg/dL (ref 8–23)
CO2: 30 mmol/L (ref 22–32)
Calcium: 8.7 mg/dL — ABNORMAL LOW (ref 8.9–10.3)
Chloride: 100 mmol/L (ref 98–111)
Creatinine, Ser: 0.61 mg/dL (ref 0.44–1.00)
GFR, Estimated: >60 mL/min (ref >60)
Glucose, Bld: 137 mg/dL — ABNORMAL HIGH (ref 70–99)
Potassium: 4.5 mmol/L (ref 3.5–5.1)
Sodium: 139 mmol/L (ref 135–145)

## 2023-05-18 LAB — CBC
HCT: 35.4 % — ABNORMAL LOW (ref 36.0–46.0)
Hemoglobin: 11.6 g/dL — ABNORMAL LOW (ref 12.0–15.0)
MCH: 31.6 pg (ref 26.0–34.0)
MCHC: 32.8 g/dL (ref 30.0–36.0)
MCV: 96.5 fL (ref 80.0–100.0)
Platelets: 251 10*3/uL (ref 150–400)
RBC: 3.67 MIL/uL — ABNORMAL LOW (ref 3.87–5.11)
RDW: 13.4 % (ref 11.5–15.5)
WBC: 9.1 10*3/uL (ref 4.0–10.5)
nRBC: 0 % (ref 0.0–0.2)

## 2023-05-18 LAB — GLUCOSE, CAPILLARY
Glucose-Capillary: 125 mg/dL — ABNORMAL HIGH (ref 70–99)
Glucose-Capillary: 142 mg/dL — ABNORMAL HIGH (ref 70–99)

## 2023-05-18 NOTE — Discharge Summary (Signed)
 Physician Discharge Summary   Patient: Dawn [name] MRN: [id number] DOB: Nov 22, 1940  Admit date:     05/09/2023  Discharge date: 05/18/23  Discharge Physician: Lurene Shadow   PCP: [facility]   Recommendations at discharge:   Follow-up with PCP in 1 week Follow-up with general surgeon on May 27, 2023  Discharge Diagnoses: Principal Problem:   Acute cholangitis Active Problems:   Hypertension   PTSD (post-traumatic stress disorder)   Fibromyalgia   Type 2 diabetes mellitus with diabetic neuropathy, unspecified (HCC)   Paranoia (HCC)   Ascending cholangitis   Cholelithiasis with choledocholithiasis   Dementia with behavioral disturbance (HCC)   Severe sepsis (HCC)   E coli bacteremia   Umbilical hernia without obstruction and without gangrene  Resolved Problems:   * No resolved hospital problems. *  Hospital Course:  Dawn [name] is a 83 y.o. female with medical history significant of HTN, IIDM, SLE, rheumatoid arthritis, anxiety/depression/PTSD, presented with worsening of RUQ pain nauseous vomiting and fever.  Upon arriving to hospital, patient had a heart rate of 111, white cell count of 17.9.  Lactic acid 2.8.  MRCP showed dilated common bile duct, evidence of ascending cholangitis.  General surgery and GI consulted, patient was also placed on antibiotics.   Assessment and Plan:  Severe sepsis secondary to ascending cholangitis. Choledocholithiasis with ascending cholangitis. E. coli bacteremia Blood culture showed ESBL E. coli.   Completed 7 days of IV meropenem on 05/17/2023  S/p laparoscopic cholecystectomy on 05/14/2023.   Analgesics as needed for pain. Outpatient follow-up with general surgeon in within 10 days     Dementia with behavioral disturbance. PTSD, paranoia. Stable.  No acute issues. Continue Risperdal, venlafaxine, gabapentin and BuSpar     Essential hypertension. Continue amlodipine     Type 2 diabetes. 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 Weight loss advised     Her condition has improved and he's deemed stable for discharge today.              Consultants: General Surgeon, gastroenterologist Procedures performed: ERCP on 05/12/2023, laparoscopic cholecystectomy on 05/14/2023 Disposition: Assisted living Diet recommendation:  Cardiac and Carb modified diet DISCHARGE MEDICATION: Allergies as of 05/18/2023       Reactions   Doxycycline Nausea And Vomiting   Nsaids Other (See Comments)   GI Bleed   Penicillin G Swelling   Sulfa Antibiotics Nausea And Vomiting        Medication List     TAKE these medications    acetaminophen 650 MG CR tablet Commonly known as: TYLENOL Take 650 mg by mouth every 8 (eight) hours as needed for pain.   amLODipine 5 MG tablet Commonly known as: NORVASC Take 5 mg by mouth daily.   B-complex with vitamin C tablet Take 1 tablet by mouth daily.   Biotin 2.5 MG Caps Take 10 capsules by mouth 3 (three) times daily.   busPIRone 15 MG tablet Commonly known as: BUSPAR Take 15 mg by mouth 3 (three) times daily.   cetirizine 10 MG tablet Commonly known as: ZYRTEC Take 10 mg by mouth daily.   cholecalciferol 25 MCG (1000 UNIT) tablet Commonly known as: VITAMIN D3 Take 2,000 Units by mouth daily.   diclofenac Sodium 1 % Gel Commonly known as: VOLTAREN Apply 2 g topically 3 (three) times daily as needed (Pain).   gabapentin 100 MG capsule Commonly known as: NEURONTIN Take 100 mg by mouth 3 (three)  times daily.   hydrocortisone cream 0.5 % Apply 1 Application topically 3 (three) times daily as needed for itching.   magnesium gluconate 500 (27 Mg) MG Tabs tablet Commonly known as: MAGONATE Take 500 mg by mouth in the morning and at bedtime.   melatonin 5 MG Tabs Take 10 mg by mouth at bedtime.   metFORMIN 850 MG tablet Commonly known as: GLUCOPHAGE Take 850 mg by mouth 2 (two) times daily.   multivitamin  Liqd Take 5 mLs by mouth daily.   nystatin powder Generic drug: nystatin Apply 1 Application topically 2 (two) times daily as needed (Rash).   ondansetron 4 MG tablet Commonly known as: ZOFRAN Take 4 mg by mouth every 6 (six) hours as needed for nausea or vomiting.   oxyCODONE 5 MG immediate release tablet Commonly known as: Roxicodone Take 1 tablet (5 mg total) by mouth every 6 (six) hours as needed for severe pain (pain score 7-10) or moderate pain (pain score 4-6).   pantoprazole 40 MG tablet Commonly known as: PROTONIX Take 1 tablet (40 mg total) by mouth daily.   risperiDONE 0.25 MG tablet Commonly known as: RISPERDAL Take 0.25 mg by mouth daily.   venlafaxine XR 75 MG 24 hr capsule Commonly known as: Effexor XR Take 75 mg in every evening.   venlafaxine XR 150 MG 24 hr capsule Commonly known as: Effexor XR Take 150 mg by mouth every morning.        Follow-up Information     Donovan Kail, PA-C Follow up on 05/27/2023.   Specialty: Physician Assistant Why: Appointment scheduled on 05/27/23 at 2:45 PM.  Please arrive 15 min early. Contact information: [address] [place] [address] [phone]                Discharge Exam: Ceasar Mons Weights   05/11/23 0751 05/12/23 0725  Weight: 90.4 kg 90.1 kg   GEN: NAD SKIN: Warm and dry EYES: No pallor or icterus ENT: MMM CV: RRR PULM: CTA B ABD: soft, ND, NT, +BS CNS: AAO x 3, non focal EXT: No edema or tenderness   Condition at discharge: good  The results of significant diagnostics from this hospitalization (including imaging, microbiology, ancillary and laboratory) are listed below for reference.   Imaging Studies: DG C-Arm 1-60 Min-No Report Result Date: 05/12/2023 Fluoroscopy was utilized by the requesting physician.  No radiographic interpretation.   MR ABDOMEN MRCP WO CONTRAST Result Date: 05/10/2023 CLINICAL DATA:  Cholelithiasis.  Abdominal pain. EXAM: MRI ABDOMEN WITHOUT  CONTRAST  (INCLUDING MRCP) TECHNIQUE: Multiplanar multisequence MR imaging of the abdomen was performed. Heavily T2-weighted images of the biliary and pancreatic ducts were obtained, and three-dimensional MRCP images were rendered by post processing. COMPARISON:  CT and ultrasound exams from earlier same day FINDINGS: Lower chest: No acute findings. Hepatobiliary: No intrahepatic biliary duct dilatation. Common bile duct measures 6 mm diameter, upper normal. MRCP imaging demonstrates abrupt termination of the common bile duct at the ampulla on some series (see image 4 of series 18 and image 10 of series 17) which raises concern for an impacted stone at the ampulla although this could represent mass-effect on the duct by the ampulla itself. No other biliary duct stones evident. Gallbladder shows numerous tiny layering 1-2 mm stones without appreciable gallbladder wall thickening. While there is no substantial pericholecystic edema or fluid, a trace amount of fluid between the liver capsule and the gallbladder wall is not excluded. Pancreas: No focal mass lesion. No dilatation of  the main duct. No intraparenchymal cyst. No peripancreatic edema. Spleen:  No splenomegaly. No suspicious focal mass lesion. Adrenals/Urinary Tract: No adrenal nodule or mass. Central sinus cysts are noted in both kidneys. Tiny cortical T2 hyperintensities in both kidneys are too small to characterize but statistically most likely benign and also compatible with cyst. No followup imaging is recommended. Stomach/Bowel: Stomach is decompressed. Duodenum is normally positioned as is the ligament of Treitz. No small bowel or colonic dilatation within the visualized abdomen. Vascular/Lymphatic: No abdominal aortic aneurysm no abdominal lymphadenopathy Other:  No intraperitoneal free fluid. Musculoskeletal: No suspicious marrow signal abnormality IMPRESSION: 1. Cholelithiasis with numerous 1-2 mm tiny stones in the lumen of the gallbladder. No  overt gallbladder wall thickening, but some trace fluid between the gallbladder in the liver capsule cannot be excluded. 2. No intrahepatic biliary duct dilatation. Common bile duct measures 6 mm diameter, upper normal with relatively abrupt termination at the ampulla. The common bile duct has a concave terminus, bulging into the duct, potentially from mass effect of the ampulla but impacted stone at the ampulla could also have this appearance. ERCP could be used to further characterize, as clinically warranted. 3. No other acute findings in the abdomen. Electronically Signed   By: Kennith Center M.D.   On: 05/10/2023 05:26   MR 3D Recon At Scanner Result Date: 05/10/2023 CLINICAL DATA:  Cholelithiasis.  Abdominal pain. EXAM: MRI ABDOMEN WITHOUT CONTRAST  (INCLUDING MRCP) TECHNIQUE: Multiplanar multisequence MR imaging of the abdomen was performed. Heavily T2-weighted images of the biliary and pancreatic ducts were obtained, and three-dimensional MRCP images were rendered by post processing. COMPARISON:  CT and ultrasound exams from earlier same day FINDINGS: Lower chest: No acute findings. Hepatobiliary: No intrahepatic biliary duct dilatation. Common bile duct measures 6 mm diameter, upper normal. MRCP imaging demonstrates abrupt termination of the common bile duct at the ampulla on some series (see image 4 of series 18 and image 10 of series 17) which raises concern for an impacted stone at the ampulla although this could represent mass-effect on the duct by the ampulla itself. No other biliary duct stones evident. Gallbladder shows numerous tiny layering 1-2 mm stones without appreciable gallbladder wall thickening. While there is no substantial pericholecystic edema or fluid, a trace amount of fluid between the liver capsule and the gallbladder wall is not excluded. Pancreas: No focal mass lesion. No dilatation of the main duct. No intraparenchymal cyst. No peripancreatic edema. Spleen:  No splenomegaly. No  suspicious focal mass lesion. Adrenals/Urinary Tract: No adrenal nodule or mass. Central sinus cysts are noted in both kidneys. Tiny cortical T2 hyperintensities in both kidneys are too small to characterize but statistically most likely benign and also compatible with cyst. No followup imaging is recommended. Stomach/Bowel: Stomach is decompressed. Duodenum is normally positioned as is the ligament of Treitz. No small bowel or colonic dilatation within the visualized abdomen. Vascular/Lymphatic: No abdominal aortic aneurysm no abdominal lymphadenopathy Other:  No intraperitoneal free fluid. Musculoskeletal: No suspicious marrow signal abnormality IMPRESSION: 1. Cholelithiasis with numerous 1-2 mm tiny stones in the lumen of the gallbladder. No overt gallbladder wall thickening, but some trace fluid between the gallbladder in the liver capsule cannot be excluded. 2. No intrahepatic biliary duct dilatation. Common bile duct measures 6 mm diameter, upper normal with relatively abrupt termination at the ampulla. The common bile duct has a concave terminus, bulging into the duct, potentially from mass effect of the ampulla but impacted stone at the ampulla could also have  this appearance. ERCP could be used to further characterize, as clinically warranted. 3. No other acute findings in the abdomen. Electronically Signed   By: Kennith Center M.D.   On: 05/10/2023 05:26   CT ABDOMEN PELVIS W CONTRAST Result Date: 05/09/2023 CLINICAL DATA:  Right-sided abdominal pain. Nausea and vomiting. Cholelithiasis. EXAM: CT ABDOMEN AND PELVIS WITH CONTRAST TECHNIQUE: Multidetector CT imaging of the abdomen and pelvis was performed using the standard protocol following bolus administration of intravenous contrast. RADIATION DOSE REDUCTION: This exam was performed according to the departmental dose-optimization program which includes automated exposure control, adjustment of the mA and/or kV according to patient size and/or use of  iterative reconstruction technique. CONTRAST:  OMNIPAQUE IOHEXOL 300 MG/ML  SOLN COMPARISON:  Ultrasound 05/09/2023 and CT scan 04/07/2023 FINDINGS: Lower chest: Left anterior descending coronary artery atherosclerosis. Mitral valve calcification. Bibasilar tree-in-bud reticulonodular opacities compatible with atypical infectious bronchiolitis. These are most concentrated in the right middle lobe. Small type 1 hiatal hernia. Hepatobiliary: Nodularity of the hepatic contour favoring mild cirrhosis morphology. Borderline gallbladder wall thickening. Small gallstones noted in the gallbladder. Common bile duct caliber 7 mm in diameter, within normal limits for age. Pancreas: Unremarkable Spleen: Unremarkable Adrenals/Urinary Tract: Bilateral peripelvic renal cysts are benign. No further imaging workup of these lesions is indicated. Otherwise unremarkable. Stomach/Bowel: Prominent stool throughout the colon favors constipation. Normal appendix. No dilated bowel. Vascular/Lymphatic: Atherosclerosis is present, including aortoiliac atherosclerotic disease. Reproductive: Left eccentric subserosal uterine fibroid unchanged from previous. Other: No supplemental non-categorized findings. Musculoskeletal: Grade 1 degenerative anterolisthesis at L5-S1. Lumbar spondylosis and degenerative disc disease contribute to impingement at L3-4, L4-5, and L5-S1. This impingement is most striking at L3-4. IMPRESSION: 1. Cholelithiasis with borderline gallbladder wall thickening. Correlate clinically in assessing for acute cholecystitis. No biliary dilatation. 2. Prominent stool throughout the colon favors constipation. 3. Bibasilar tree-in-bud reticulonodular opacities compatible with atypical infectious bronchiolitis. These are most concentrated in the right middle lobe. 4. Small type 1 hiatal hernia. 5. Nodularity of the hepatic contour favoring mild cirrhosis morphology. 6. Lumbar impingement at L3-4, L4-5, and L5-S1. 7. Left  anterior descending coronary artery atherosclerosis. Aortic Atherosclerosis (ICD10-I70.0). 8. Mitral valve calcification. Electronically Signed   By: Gaylyn Rong M.D.   On: 05/09/2023 20:28   US ABDOMEN LIMITED RUQ (LIVER/GB) Result Date: 05/09/2023 CLINICAL DATA:  Abdominal pain. EXAM: ULTRASOUND ABDOMEN LIMITED RIGHT UPPER QUADRANT COMPARISON:  CT 04/07/2023 FINDINGS: Gallbladder: Cholelithiasis with small stones layering in the gallbladder. Borderline gallbladder wall thickening at 4 mm. No pericholecystic edema. Murphy's sign is negative. Common bile duct: Diameter: 5 mm, normal Liver: Normal parenchymal echotexture. Mild nodularity to the liver contour, possibly indicating early cirrhosis. No focal lesions identified. Portal vein is patent on color Doppler imaging with normal direction of blood flow towards the liver. Other: None. IMPRESSION: 1. Cholelithiasis with mild gallbladder wall thickening. Murphy's sign is negative. 2. 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 Electronically Signed   By: Burman Nieves M.D.   On: 05/09/2023 19:14    Microbiology: Results for orders placed or performed during the hospital encounter of 05/09/23  Resp panel by RT-PCR (RSV, Flu A&B, Covid) Anterior Nasal Swab     Status: None   Collection Time: 05/09/23  2:40 PM   Specimen: Anterior Nasal Swab  Result Value Ref Range Status   SARS Coronavirus 2 by RT PCR NEGATIVE NEGATIVE Final    Comment: (NOTE) SARS-CoV-2 target nucleic acids are NOT DETECTED.  The SARS-CoV-2 RNA is generally  detectable in upper respiratory specimens during the acute phase of infection. The lowest concentration of SARS-CoV-2 viral copies this assay can detect is 138 copies/mL. A negative result does not preclude SARS-Cov-2 infection and should not be used as the sole basis for treatment or other patient management decisions. A negative result may occur with  improper specimen collection/handling,  submission of specimen other than nasopharyngeal swab, presence of viral mutation(s) within the areas targeted by this assay, and inadequate number of viral copies(<138 copies/mL). A negative result must be combined with clinical observations, patient history, and epidemiological information. The expected result is Negative.  Fact Sheet for Patients:  BloggerCourse.com  Fact Sheet for Healthcare Providers:  SeriousBroker.it  This test is no t yet approved or cleared by the [place] FDA and  has been authorized for detection and/or diagnosis of SARS-CoV-2 by FDA under an Emergency Use Authorization (EUA). This EUA will remain  in effect (meaning this test can be used) for the duration of the COVID-19 declaration under Section 564(b)(1) of the Act, 21 U.S.C.section 360bbb-3(b)(1), unless the authorization is terminated  or revoked sooner.       Influenza A by PCR NEGATIVE NEGATIVE Final   Influenza B by PCR NEGATIVE NEGATIVE Final    Comment: (NOTE) The Xpert Xpress SARS-CoV-2/FLU/RSV plus assay is intended as an aid in the diagnosis of influenza from Nasopharyngeal swab specimens and should not be used as a sole basis for treatment. Nasal washings and aspirates are unacceptable for Xpert Xpress SARS-CoV-2/FLU/RSV testing.  Fact Sheet for Patients: BloggerCourse.com  Fact Sheet for Healthcare Providers: SeriousBroker.it  This test is not yet approved or cleared by the [place] FDA and has been authorized for detection and/or diagnosis of SARS-CoV-2 by FDA under an Emergency Use Authorization (EUA). This EUA will remain in effect (meaning this test can be used) for the duration of the COVID-19 declaration under Section 564(b)(1) of the Act, 21 U.S.C. section 360bbb-3(b)(1), unless the authorization is terminated or revoked.     Resp Syncytial Virus by PCR NEGATIVE  NEGATIVE Final    Comment: (NOTE) Fact Sheet for Patients: BloggerCourse.com  Fact Sheet for Healthcare Providers: SeriousBroker.it  This test is not yet approved or cleared by the [place] FDA and has been authorized for detection and/or diagnosis of SARS-CoV-2 by FDA under an Emergency Use Authorization (EUA). This EUA will remain in effect (meaning this test can be used) for the duration of the COVID-19 declaration under Section 564(b)(1) of the Act, 21 U.S.C. section 360bbb-3(b)(1), unless the authorization is terminated or revoked.  Performed at [facility], [address] [place], [place] [address]   Blood Culture (routine x 2)     Status: None   Collection Time: 05/09/23  8:15 PM   Specimen: BLOOD  Result Value Ref Range Status   Specimen Description BLOOD LEFT [facility]  Final   Special Requests   Final    BOTTLES DRAWN AEROBIC AND ANAEROBIC Blood Culture adequate volume   Culture   Final    NO GROWTH 5 DAYS Performed at [facility], [address], [place], [place] [address]    Report Status 05/14/2023 FINAL  Final  Blood Culture ID Panel (Reflexed)     Status: Abnormal   Collection Time: 05/09/23  8:15 PM  Result Value Ref Range Status   Enterococcus faecalis NOT DETECTED NOT DETECTED Final   Enterococcus Faecium NOT DETECTED NOT DETECTED Final   Listeria monocytogenes NOT DETECTED NOT DETECTED Final  Staphylococcus species NOT DETECTED NOT DETECTED Final   Staphylococcus aureus (BCID) NOT DETECTED NOT DETECTED Final   Staphylococcus epidermidis NOT DETECTED NOT DETECTED Final   Staphylococcus lugdunensis NOT DETECTED NOT DETECTED Final   Streptococcus species NOT DETECTED NOT DETECTED Final   Streptococcus agalactiae NOT DETECTED NOT DETECTED Final   Streptococcus pneumoniae NOT DETECTED NOT DETECTED Final   Streptococcus pyogenes NOT DETECTED NOT DETECTED Final   A.calcoaceticus-baumannii  NOT DETECTED NOT DETECTED Final   Bacteroides fragilis NOT DETECTED NOT DETECTED Final   Enterobacterales DETECTED (A) NOT DETECTED Final    Comment: Enterobacterales represent a large order of gram negative bacteria, not a single organism.   Enterobacter cloacae complex NOT DETECTED NOT DETECTED Final   Escherichia coli DETECTED (A) NOT DETECTED Final    Comment: CRITICAL RESULT CALLED TO, READ BACK BY AND VERIFIED WITH: MERRILL, K. 2034 05/11/23 LRL    Klebsiella aerogenes NOT DETECTED NOT DETECTED Final   Klebsiella oxytoca NOT DETECTED NOT DETECTED Final   Klebsiella pneumoniae NOT DETECTED NOT DETECTED Final   Proteus species NOT DETECTED NOT DETECTED Final   Salmonella species NOT DETECTED NOT DETECTED Final   Serratia marcescens NOT DETECTED NOT DETECTED Final   Haemophilus influenzae NOT DETECTED NOT DETECTED Final   Neisseria meningitidis NOT DETECTED NOT DETECTED Final   Pseudomonas aeruginosa NOT DETECTED NOT DETECTED Final   Stenotrophomonas maltophilia NOT DETECTED NOT DETECTED Final   Candida albicans NOT DETECTED NOT DETECTED Final   Candida auris NOT DETECTED NOT DETECTED Final   Candida glabrata NOT DETECTED NOT DETECTED Final   Candida krusei NOT DETECTED NOT DETECTED Final   Candida parapsilosis NOT DETECTED NOT DETECTED Final   Candida tropicalis NOT DETECTED NOT DETECTED Final   Cryptococcus neoformans/gattii NOT DETECTED NOT DETECTED Final   CTX-M ESBL DETECTED (A) NOT DETECTED Final    Comment: CRITICAL RESULT CALLED TO, READ BACK BY AND VERIFIED WITH: MERRILL, K. 2034 05/11/23 LRL (NOTE) Extended spectrum beta-lactamase detected. Recommend a carbapenem as initial therapy.      Carbapenem resistance IMP NOT DETECTED NOT DETECTED Final   Carbapenem resistance KPC NOT DETECTED NOT DETECTED Final   Carbapenem resistance NDM NOT DETECTED NOT DETECTED Final   Carbapenem resist OXA 48 LIKE NOT DETECTED NOT DETECTED Final   Carbapenem resistance VIM NOT DETECTED  NOT DETECTED Final    Comment: Performed at [facility], [address] [place], [place] [address]  Blood Culture (routine x 2)     Status: Abnormal   Collection Time: 05/09/23  8:31 PM   Specimen: BLOOD  Result Value Ref Range Status   Specimen Description   Final    BLOOD RIGHT FA Performed at [facility], [address] [place], [place] [address]    Special Requests   Final    BOTTLES DRAWN AEROBIC AND ANAEROBIC Blood Culture results may not be optimal due to an inadequate volume of blood received in culture bottles Performed at [facility], [address], [place], [place] [address]    Culture  Setup Time   Final    GRAM NEGATIVE RODS AEROBIC BOTTLE ONLY Organism ID to follow CRITICAL RESULT CALLED TO, READ BACK BY AND VERIFIED WITH: MERRILL, K. 2034 05/11/23 LRL Performed at [facility], [address], [place], [place] [address]    Culture (A)  Final    ESCHERICHIA COLI Confirmed Extended Spectrum Beta-Lactamase Producer (ESBL).  In bloodstream infections from ESBL organisms, carbapenems are preferred over piperacillin/tazobactam. They  are shown to have a lower risk of mortality.    Report Status 05/14/2023 FINAL  Final   Organism ID, Bacteria ESCHERICHIA COLI  Final      Susceptibility   Escherichia coli - MIC*    AMPICILLIN >=32 RESISTANT Resistant     CEFEPIME 16 RESISTANT Resistant     CEFTAZIDIME RESISTANT Resistant     CEFTRIAXONE >=64 RESISTANT Resistant     CIPROFLOXACIN >=4 RESISTANT Resistant     GENTAMICIN <=1 SENSITIVE Sensitive     IMIPENEM <=0.25 SENSITIVE Sensitive     TRIMETH/SULFA <=20 SENSITIVE Sensitive     AMPICILLIN/SULBACTAM 8 SENSITIVE Sensitive     PIP/TAZO <=4 SENSITIVE Sensitive ug/mL    * ESCHERICHIA COLI    Labs: CBC: Recent Labs  Lab 05/13/23 0440 05/14/23 0448 05/15/23 0448 05/18/23 0458  WBC 6.9 7.3 11.3* 9.1  NEUTROABS  --   --  8.8*  --   HGB 12.5 12.9 12.7 11.6*  HCT 37.2 39.4 37.4  35.4*  MCV 92.8 95.2 94.9 96.5  PLT 213 247 233 251   Basic Metabolic Panel: Recent Labs  Lab 05/12/23 0414 05/13/23 0440 05/14/23 0448 05/15/23 0448 05/18/23 0458  NA 137 136 138 135 139  K 3.9 4.1 4.1 4.5 4.5  CL 106 109 103 104 100  CO2 23 24 26 23 30   GLUCOSE 131* 104* 145* 142* 137*  BUN 11 12 8 11 14   CREATININE 0.70 0.59 0.66 0.57 0.61  CALCIUM 8.5* 8.4* 8.7* 8.7* 8.7*  MG 2.0 2.1  --   --   --    Liver Function Tests: Recent Labs  Lab 05/12/23 0414 05/13/23 0440 05/14/23 0448 05/15/23 0448  AST 61* 68* 69* 79*  ALT 120* 100* 93* 82*  ALKPHOS 126 146* 206* 175*  BILITOT 1.4* 2.3* 1.3* 0.9  PROT 6.1* 5.9* 6.2* 6.0*  ALBUMIN 3.0* 2.9* 3.2* 3.1*   CBG: Recent Labs  Lab 05/17/23 0809 05/17/23 1203 05/17/23 1638 05/17/23 2127 05/18/23 0745  GLUCAP 111* 182* 168* 210* 125*    Discharge time spent: greater than 30 minutes.  Signed: Lurene Shadow, MD [facility] 05/18/2023

## 2023-05-18 NOTE — [place] FL2 (Signed)
 [place] MEDICAID FL2 LEVEL OF CARE FORM     IDENTIFICATION  Patient Name: Dawn [name] Birthdate: 1940-06-01 Sex: female Admission Date (Current Location): 05/09/2023  [place] and [place] Number:  Chiropodist and Address:  [facility], [address], [place], [place] [address]      Provider Number: [id number]  Attending Physician Name and Address:  Lurene Shadow, MD  Relative Name and Phone Number:       Current Level of Care: Hospital Recommended Level of Care: Memory Care (with PT and OT through [facility]) Prior Approval Number:    Date Approved/Denied:   PASRR Number:    Discharge Plan: Other (Comment) (Memory Care with PT and OT through [facility],)    Current Diagnoses: Patient Active Problem List   Diagnosis Date Noted   Umbilical hernia without obstruction and without gangrene 05/14/2023   E coli bacteremia 05/13/2023   Dementia with behavioral disturbance (HCC) 05/11/2023   Severe sepsis (HCC) 05/11/2023   Ascending cholangitis 05/10/2023   Acute cholangitis 05/10/2023   Cholelithiasis with choledocholithiasis 05/10/2023   Paranoia (HCC) 12/30/2022   Type 2 diabetes mellitus with diabetic neuropathy, unspecified (HCC) 07/26/2021   Ambulatory dysfunction 07/26/2021   Fibromyalgia 06/03/2021   Melena 06/01/2021   Hypertension    History of ischemic colitis 2013    Hyperglycemia    Generalized anxiety disorder 04/16/2017   Major depressive disorder 04/16/2017   Panic disorder 04/16/2017   PTSD (post-traumatic stress disorder) 04/16/2017   DDD (degenerative disc disease), lumbar 07/17/2014   Lumbar facet arthropathy 07/17/2014   Lumbar radicular pain 07/17/2014   Sacroiliac joint dysfunction 07/17/2014    Orientation RESPIRATION BLADDER Height & Weight     Self, Time, Situation, Place  Normal Continent Weight: 198 lb 10.2 oz (90.1 kg) Height:  5' 4.02" (162.6 cm)  BEHAVIORAL SYMPTOMS/MOOD NEUROLOGICAL BOWEL  NUTRITION STATUS   (None)   Continent Diet (Cardiac/Carb modified)  AMBULATORY STATUS COMMUNICATION OF NEEDS Skin     Verbally Surgical wounds (Incision on abdomen: Dermabond.)                       Personal Care Assistance Level of Assistance              Functional Limitations Info  Sight, Hearing, Speech Sight Info: Adequate Hearing Info: Adequate Speech Info: Adequate    SPECIAL CARE FACTORS FREQUENCY  PT (By licensed PT), OT (By licensed OT)     PT Frequency: 3 x week OT Frequency: 3 x week            Contractures Contractures Info: Not present    Additional Factors Info  Code Status, Allergies Code Status Info: Full code Allergies Info: Doxycycline, Nsaids, Penicillin G, Sulfa Antibiotics           Current Medications (05/18/2023):  This is the current hospital active medication list Current Facility-Administered Medications  Medication Dose Route Frequency Provider Last Rate Last Admin   acetaminophen (TYLENOL) tablet 1,000 mg  1,000 mg Oral Q6H PRN Piscoya, Jose, MD   1,000 mg at 05/18/23 0418   amLODipine (NORVASC) tablet 5 mg  5 mg Oral Daily Piscoya, Jose, MD   5 mg at 05/17/23 0823   busPIRone (BUSPAR) tablet 15 mg  15 mg Oral TID Henrene Dodge, MD   15 mg at 05/17/23 2052   butalbital-acetaminophen-caffeine (FIORICET) 50-325-40 MG per tablet 1 tablet  1 tablet Oral Q6H PRN Henrene Dodge, MD   1  tablet at 05/10/23 2331   clonazePAM (KLONOPIN) disintegrating tablet 0.5 mg  0.5 mg Oral BID PRN Henrene Dodge, MD   0.5 mg at 05/17/23 2052   enoxaparin (LOVENOX) injection 40 mg  40 mg Subcutaneous Q24H Piscoya, Jose, MD   40 mg at 05/17/23 1214   gabapentin (NEURONTIN) capsule 100 mg  100 mg Oral TID Henrene Dodge, MD   100 mg at 05/17/23 2052   hydrALAZINE (APRESOLINE) injection 5 mg  5 mg Intravenous Q6H PRN Piscoya, Elita Quick, MD       hydrOXYzine (ATARAX) tablet 10 mg  10 mg Oral Daily PRN Henrene Dodge, MD   10 mg at 05/18/23 0418   insulin aspart  (novoLOG) injection 0-15 Units  0-15 Units Subcutaneous TID WC Piscoya, Elita Quick, MD   3 Units at 05/17/23 1644   loratadine (CLARITIN) tablet 10 mg  10 mg Oral Daily Piscoya, Elita Quick, MD   10 mg at 05/17/23 0823   meropenem (MERREM) 1 g in sodium chloride 0.9 % 100 mL IVPB  1 g Intravenous Q8H Piscoya, Jose, MD 200 mL/hr at 05/18/23 0423 1 g at 05/18/23 0423   ondansetron (ZOFRAN) tablet 4 mg  4 mg Oral Q6H PRN Henrene Dodge, MD   4 mg at 05/18/23 0420   Or   ondansetron (ZOFRAN) injection 4 mg  4 mg Intravenous Q6H PRN Piscoya, Elita Quick, MD       oxyCODONE (Oxy IR/ROXICODONE) immediate release tablet 5-10 mg  5-10 mg Oral Q4H PRN Piscoya, Jose, MD   5 mg at 05/17/23 2052   pantoprazole (PROTONIX) EC tablet 40 mg  40 mg Oral Daily Piscoya, Elita Quick, MD   40 mg at 05/17/23 1610   polyethylene glycol (MIRALAX / GLYCOLAX) packet 17 g  17 g Oral Daily PRN Lurene Shadow, MD       risperiDONE (RISPERDAL) tablet 0.25 mg  0.25 mg Oral Daily Piscoya, Jose, MD   0.25 mg at 05/17/23 9604   sodium chloride flush (NS) 0.9 % injection 10-40 mL  10-40 mL Intracatheter Q12H Piscoya, Jose, MD   10 mL at 05/16/23 0900   venlafaxine XR (EFFEXOR-XR) 24 hr capsule 150 mg  150 mg Oral Q breakfast Piscoya, Jose, MD   150 mg at 05/17/23 5409   venlafaxine XR (EFFEXOR-XR) 24 hr capsule 75 mg  75 mg Oral QHS Piscoya, Jose, MD   75 mg at 05/16/23 2020     Discharge Medications: TAKE these medications     acetaminophen 650 MG CR tablet Commonly known as: TYLENOL Take 650 mg by mouth every 8 (eight) hours as needed for pain.    amLODipine 5 MG tablet Commonly known as: NORVASC Take 5 mg by mouth daily.    B-complex with vitamin C tablet Take 1 tablet by mouth daily.    Biotin 2.5 MG Caps Take 10 capsules by mouth 3 (three) times daily.    busPIRone 15 MG tablet Commonly known as: BUSPAR Take 15 mg by mouth 3 (three) times daily.    cetirizine 10 MG tablet Commonly known as: ZYRTEC Take 10 mg by mouth daily.     cholecalciferol 25 MCG (1000 UNIT) tablet Commonly known as: VITAMIN D3 Take 2,000 Units by mouth daily.    diclofenac Sodium 1 % Gel Commonly known as: VOLTAREN Apply 2 g topically 3 (three) times daily as needed (Pain).    gabapentin 100 MG capsule Commonly known as: NEURONTIN Take 100 mg by mouth 3 (three) times daily.    hydrocortisone cream 0.5 %  Apply 1 Application topically 3 (three) times daily as needed for itching.    magnesium gluconate 500 (27 Mg) MG Tabs tablet Commonly known as: MAGONATE Take 500 mg by mouth in the morning and at bedtime.    melatonin 5 MG Tabs Take 10 mg by mouth at bedtime.    metFORMIN 850 MG tablet Commonly known as: GLUCOPHAGE Take 850 mg by mouth 2 (two) times daily.    multivitamin Liqd Take 5 mLs by mouth daily.    nystatin powder Generic drug: nystatin Apply 1 Application topically 2 (two) times daily as needed (Rash).    ondansetron 4 MG tablet Commonly known as: ZOFRAN Take 4 mg by mouth every 6 (six) hours as needed for nausea or vomiting.    oxyCODONE 5 MG immediate release tablet Commonly known as: Roxicodone Take 1 tablet (5 mg total) by mouth every 6 (six) hours as needed for severe pain (pain score 7-10) or moderate pain (pain score 4-6).    pantoprazole 40 MG tablet Commonly known as: PROTONIX Take 1 tablet (40 mg total) by mouth daily.    risperiDONE 0.25 MG tablet Commonly known as: RISPERDAL Take 0.25 mg by mouth daily.    venlafaxine XR 75 MG 24 hr capsule Commonly known as: Effexor XR Take 75 mg in every evening.    venlafaxine XR 150 MG 24 hr capsule Commonly known as: Effexor XR Take 150 mg by mouth every morning.    Relevant Imaging Results:  Relevant Lab Results:   Additional Information SS#: [id number]  Margarito Liner, LCSW

## 2023-05-18 NOTE — TOC Transition Note (Signed)
 Transition of Care [facility]) - Discharge Note   Patient Details  Name: Dawn [name] MRN: [id number] Date of Birth: [birth year]  Transition of Care [facility] CM/SW Contact:  Margarito Liner, LCSW Phone Number: 05/18/2023, 9:48 AM   Clinical Narrative:   Patient has orders to discharge back to [facility] today. [facility] is aware. Patient is requesting assistance with finding a different facility. She is agreeable to adding a Child psychotherapist to her home health services and [facility] confirmed they can do this. CSW faxed FL2 and discharge summary to Tammy at [facility]. CSW asked RN to call Tammy once patient is ready to be picked up. No further concerns. CSW signing off.  Final next level of care: Memory Care (with home health) Barriers to Discharge: Barriers Resolved   Patient Goals and CMS Choice            Discharge Placement                Patient to be transferred to facility by: [facility] staff Name of family member notified: Left voicemail for daughter, Dallie Piles Patient and family notified of of transfer: 05/18/23  Discharge Plan and Services Additional resources added to the After Visit Summary for                            [facility] Arranged: PT, OT, Social Work [employer] Agency: [facility] ([facility]) Date HH Agency Contacted: 05/18/23   Representative spoke with at [facility] Agency: Shaun  Social Drivers of Health (SDOH) Interventions SDOH Screenings   Food Insecurity: No Food Insecurity (05/10/2023)  Housing: Low Risk  (05/10/2023)  Transportation Needs: No Transportation Needs (05/10/2023)  Utilities: Not At Risk (05/10/2023)  Alcohol Screen: Low Risk  (07/22/2021)  Depression (PHQ2-9): Low Risk  (11/25/2021)  Recent Concern: Depression (PHQ2-9) - High Risk (11/05/2021)  Social Connections: Socially Isolated (05/10/2023)  Stress: Stress Concern Present (07/22/2021)  Tobacco Use: Unknown (05/12/2023)     Readmission Risk Interventions     No data to  display

## 2023-05-18 NOTE — Plan of Care (Signed)

## 2023-05-18 NOTE — Plan of Care (Signed)

## 2023-05-22 DIAGNOSIS — R601 Generalized edema: Secondary | ICD-10-CM | POA: Diagnosis not present

## 2023-05-25 DIAGNOSIS — Z79899 Other long term (current) drug therapy: Secondary | ICD-10-CM | POA: Diagnosis not present

## 2023-05-25 DIAGNOSIS — E782 Mixed hyperlipidemia: Secondary | ICD-10-CM | POA: Diagnosis not present

## 2023-05-25 DIAGNOSIS — E038 Other specified hypothyroidism: Secondary | ICD-10-CM | POA: Diagnosis not present

## 2023-05-25 DIAGNOSIS — E119 Type 2 diabetes mellitus without complications: Secondary | ICD-10-CM | POA: Diagnosis not present

## 2023-05-25 DIAGNOSIS — D519 Vitamin B12 deficiency anemia, unspecified: Secondary | ICD-10-CM | POA: Diagnosis not present

## 2023-05-26 ENCOUNTER — Encounter: Payer: Self-pay | Admitting: Emergency Medicine

## 2023-05-26 ENCOUNTER — Emergency Department
Admission: EM | Admit: 2023-05-26 | Discharge: 2023-05-27 | Disposition: A | Discharge status: Home / Self Care | Attending: Emergency Medicine | Admitting: Emergency Medicine

## 2023-05-26 ENCOUNTER — Other Ambulatory Visit: Payer: Self-pay

## 2023-05-26 ENCOUNTER — Emergency Department

## 2023-05-26 DIAGNOSIS — I10 Essential (primary) hypertension: Secondary | ICD-10-CM | POA: Insufficient documentation

## 2023-05-26 DIAGNOSIS — G8929 Other chronic pain: Secondary | ICD-10-CM | POA: Diagnosis not present

## 2023-05-26 DIAGNOSIS — L659 Nonscarring hair loss, unspecified: Secondary | ICD-10-CM | POA: Diagnosis not present

## 2023-05-26 DIAGNOSIS — B372 Candidiasis of skin and nail: Secondary | ICD-10-CM | POA: Diagnosis not present

## 2023-05-26 DIAGNOSIS — K5730 Diverticulosis of large intestine without perforation or abscess without bleeding: Secondary | ICD-10-CM | POA: Diagnosis not present

## 2023-05-26 DIAGNOSIS — K5900 Constipation, unspecified: Secondary | ICD-10-CM | POA: Insufficient documentation

## 2023-05-26 DIAGNOSIS — K449 Diaphragmatic hernia without obstruction or gangrene: Secondary | ICD-10-CM | POA: Diagnosis not present

## 2023-05-26 DIAGNOSIS — D252 Subserosal leiomyoma of uterus: Secondary | ICD-10-CM | POA: Diagnosis not present

## 2023-05-26 DIAGNOSIS — R350 Frequency of micturition: Secondary | ICD-10-CM | POA: Diagnosis present

## 2023-05-26 DIAGNOSIS — N390 Urinary tract infection, site not specified: Secondary | ICD-10-CM | POA: Diagnosis not present

## 2023-05-26 DIAGNOSIS — K219 Gastro-esophageal reflux disease without esophagitis: Secondary | ICD-10-CM | POA: Diagnosis not present

## 2023-05-26 DIAGNOSIS — L409 Psoriasis, unspecified: Secondary | ICD-10-CM | POA: Diagnosis not present

## 2023-05-26 DIAGNOSIS — R112 Nausea with vomiting, unspecified: Secondary | ICD-10-CM | POA: Diagnosis not present

## 2023-05-26 DIAGNOSIS — F329 Major depressive disorder, single episode, unspecified: Secondary | ICD-10-CM | POA: Diagnosis not present

## 2023-05-26 DIAGNOSIS — E119 Type 2 diabetes mellitus without complications: Secondary | ICD-10-CM | POA: Diagnosis not present

## 2023-05-26 DIAGNOSIS — R1310 Dysphagia, unspecified: Secondary | ICD-10-CM | POA: Diagnosis not present

## 2023-05-26 DIAGNOSIS — K7460 Unspecified cirrhosis of liver: Secondary | ICD-10-CM | POA: Diagnosis not present

## 2023-05-26 DIAGNOSIS — F419 Anxiety disorder, unspecified: Secondary | ICD-10-CM | POA: Diagnosis not present

## 2023-05-26 LAB — CBC
HCT: 39.3 % (ref 36.0–46.0)
Hemoglobin: 13 g/dL (ref 12.0–15.0)
MCH: 31.2 pg (ref 26.0–34.0)
MCHC: 33.1 g/dL (ref 30.0–36.0)
MCV: 94.2 fL (ref 80.0–100.0)
Platelets: 370 10*3/uL (ref 150–400)
RBC: 4.17 MIL/uL (ref 3.87–5.11)
RDW: 12.3 % (ref 11.5–15.5)
WBC: 8.6 10*3/uL (ref 4.0–10.5)
nRBC: 0 % (ref 0.0–0.2)

## 2023-05-26 LAB — COMPREHENSIVE METABOLIC PANEL WITH GFR
ALT: 47 U/L — ABNORMAL HIGH (ref 0–44)
AST: 35 U/L (ref 15–41)
Albumin: 3.7 g/dL (ref 3.5–5.0)
Alkaline Phosphatase: 202 U/L — ABNORMAL HIGH (ref 38–126)
Anion gap: 9 (ref 5–15)
BUN: 17 mg/dL (ref 8–23)
CO2: 25 mmol/L (ref 22–32)
Calcium: 9 mg/dL (ref 8.9–10.3)
Chloride: 102 mmol/L (ref 98–111)
Creatinine, Ser: 0.64 mg/dL (ref 0.44–1.00)
GFR, Estimated: >60 mL/min (ref >60)
Glucose, Bld: 137 mg/dL — ABNORMAL HIGH (ref 70–99)
Potassium: 4.3 mmol/L (ref 3.5–5.1)
Sodium: 136 mmol/L (ref 135–145)
Total Bilirubin: 0.7 mg/dL (ref 0.0–1.2)
Total Protein: 6.7 g/dL (ref 6.5–8.1)

## 2023-05-26 LAB — LIPASE, BLOOD: Lipase: 22 U/L (ref 11–51)

## 2023-05-26 NOTE — ED Triage Notes (Signed)
 Patient to ED via [facility] form [facility] for abnormal labs- elevated liver enzymes. PT reports having N/V- ongoing for a few weeks.

## 2023-05-26 NOTE — ED Provider Notes (Signed)
 [facility] Provider Note    Event Date/Time   First MD Initiated Contact with Patient 05/26/23 2303     (approximate)   History   Abnormal Labs   HPI  Dawn [name] is a 83 y.o. female   She had a recent cholecystectomy for cholangitis, since then ongoing nausea, poor p.o. intake.  She has had no fevers, chills, GI bleeding.  She has not had a bowel movement for 2 days but has been passing flatus.  She feels she has difficulty urinating and some urinary frequency as well.  She has no other acute medical complaints.   External Medical Documents Reviewed: Discharge summary from 05/18/2023 for cholangitis status postcholecystectomy      Physical Exam   Triage Vital Signs: ED Triage Vitals  Encounter Vitals Group     BP 05/26/23 1615 (!) 130/112     Systolic BP Percentile --      Diastolic BP Percentile --      Pulse Rate 05/26/23 1615 69     Resp 05/26/23 1615 17     Temp 05/26/23 1615 98.5 F (36.9 C)     Temp Source 05/26/23 1615 Oral     SpO2 05/26/23 1615 96 %     Weight 05/26/23 1616 200 lb (90.7 kg)     Height 05/26/23 1616 5\' 4"  (1.626 m)     Head Circumference --      Peak Flow --      Pain Score 05/26/23 1616 0     Pain Loc --      Pain Education --      Exclude from Growth Chart --     Most recent vital signs: Vitals:   05/26/23 1615 05/26/23 2330  BP: (!) 130/112 (!) 173/78  Pulse: 69 87  Resp: 17 18  Temp: 98.5 F (36.9 C) 98.2 F (36.8 C)  SpO2: 96% 98%    General: Awake, no distress.  CV:  Good peripheral perfusion.  Resp:  Normal effort.  Abd:  No distention.  Other:  Soft benign abdominal exam to deep palpation all quadrants without rigidity or guarding.  Vital signs significant for slight hypertension otherwise vital signs normal.  Afebrile.   ED Results / Procedures / Treatments   Labs (all labs ordered are listed, but only abnormal results are displayed) Labs Reviewed  COMPREHENSIVE METABOLIC PANEL  WITH GFR - Abnormal; Notable for the following components:      Result Value   Glucose, Bld 137 (*)    ALT 47 (*)    Alkaline Phosphatase 202 (*)    All other components within normal limits  URINALYSIS, ROUTINE W REFLEX MICROSCOPIC - Abnormal; Notable for the following components:   Color, Urine YELLOW (*)    APPearance HAZY (*)    Leukocytes,Ua SMALL (*)    Bacteria, UA FEW (*)    All other components within normal limits  LIPASE, BLOOD  CBC     I ordered and reviewed the above labs they are notable for alk phos slightly elevated at 202, white blood cell count is normal.   RADIOLOGY I independently reviewed and interpreted CT of the abdomen pelvis and see no obvious obstructive or inflammatory changes I also reviewed radiologist's formal read.   PROCEDURES:  Critical Care performed: No  Procedures   MEDICATIONS ORDERED IN ED: Medications  cephALEXin (KEFLEX) capsule 500 mg (has no administration in time range)  ondansetron (ZOFRAN-ODT) disintegrating tablet 4 mg (4 mg Oral Given  05/27/23 0102)     IMPRESSION / MDM / ASSESSMENT AND PLAN / ED COURSE  I reviewed the triage vital signs and the nursing notes.                                Patient's presentation is most consistent with acute presentation with potential threat to life or bodily function.  Differential diagnosis includes, but is not limited to, postoperative pain, postoperative infection, retained stone/choledocholithiasis, obstruction, urinary tract infection  MDM:    She has ongoing nausea since her surgery over 1 week ago.  She has urinary symptoms and urinary tract infection suggested by her urinalysis so we will treat with Keflex.  Consider obstruction as well given recent surgery with no bowel movement for a few days, check CT scan for obstruction or other infectious or surgical pathology in the abdomen though low clinical suspicion given her pretty benign abdominal exam.  If this scan shows no  emergencies plan will be for discharge home with Zofran and antibiotics for urinary tract infection suspected.        FINAL CLINICAL IMPRESSION(S) / ED DIAGNOSES   Final diagnoses:  Lower urinary tract infection  Constipation, unspecified constipation type     Rx / DC Orders   ED Discharge Orders          Ordered    cephALEXin (KEFLEX) 500 MG capsule  4 times daily        05/27/23 0116             Note:  This document was prepared using Dragon voice recognition software and may include unintentional dictation errors.    Pilar Jarvis, MD 05/27/23 [phone]

## 2023-05-26 NOTE — ED Notes (Signed)
 First nurse note: Pt here via [facility] from [facility], pt here with elevated liver enzymes. Gallbladder removal 1 week ago, no HX of cirrhosis.   140/80 HR: 90 99%

## 2023-05-27 ENCOUNTER — Encounter: Admitting: Physician Assistant

## 2023-05-27 DIAGNOSIS — K5730 Diverticulosis of large intestine without perforation or abscess without bleeding: Secondary | ICD-10-CM | POA: Diagnosis not present

## 2023-05-27 DIAGNOSIS — K449 Diaphragmatic hernia without obstruction or gangrene: Secondary | ICD-10-CM | POA: Diagnosis not present

## 2023-05-27 DIAGNOSIS — D252 Subserosal leiomyoma of uterus: Secondary | ICD-10-CM | POA: Diagnosis not present

## 2023-05-27 DIAGNOSIS — N390 Urinary tract infection, site not specified: Secondary | ICD-10-CM | POA: Diagnosis not present

## 2023-05-27 DIAGNOSIS — K7460 Unspecified cirrhosis of liver: Secondary | ICD-10-CM | POA: Diagnosis not present

## 2023-05-27 LAB — URINALYSIS, ROUTINE W REFLEX MICROSCOPIC
Bilirubin Urine: NEGATIVE
Glucose, UA: NEGATIVE mg/dL
Hgb urine dipstick: NEGATIVE
Ketones, ur: NEGATIVE mg/dL
Nitrite: NEGATIVE
Protein, ur: NEGATIVE mg/dL
Specific Gravity, Urine: 1.015 (ref 1.005–1.030)
pH: 5 (ref 5.0–8.0)

## 2023-05-27 MED ORDER — ONDANSETRON 4 MG PO TBDP: 4.0000 mg | ORAL_TABLET | Freq: Three times a day (TID) | ORAL | 0 refills | Status: DC | PRN

## 2023-05-27 MED ORDER — ONDANSETRON 4 MG PO TBDP
4.0000 mg | ORAL_TABLET | Freq: Once | ORAL | Status: AC
  Administered 2023-05-27: 4 mg via ORAL
  Filled 2023-05-27: qty 1

## 2023-05-27 MED ORDER — CEPHALEXIN 500 MG PO CAPS
500.0000 mg | ORAL_CAPSULE | Freq: Once | ORAL | Status: AC
  Administered 2023-05-27: 500 mg via ORAL
  Filled 2023-05-27: qty 1

## 2023-05-27 MED ORDER — CEPHALEXIN 500 MG PO CAPS: 500.0000 mg | ORAL_CAPSULE | Freq: Four times a day (QID) | ORAL | 0 refills | Status: AC

## 2023-05-27 NOTE — ED Notes (Signed)
 This RN received report from Ovidio Kin RN and performed care handoff. This RN introduced self to pt. Call light in reach, bed wheels locked, side rail raised, pt updated on plan of care. Rounding completed.

## 2023-05-27 NOTE — ED Notes (Signed)
 Writer called pt's son brad and left a voicemail and a call back number. Pt requested this RN call brad for an update on transportation home. Pt updated.

## 2023-05-27 NOTE — ED Notes (Signed)
 Writer called pt's son brad again and left a voicemail and a call back number.  Pt updated and also given breakfast and beverage.

## 2023-05-27 NOTE — ED Notes (Signed)
 Pt states her son will be back in the AM and can take her back to [facility]

## 2023-05-27 NOTE — ED Notes (Signed)
 Pt nauseated. Modesto Charon MD made aware. Verbal order for 4mg  zofran odt

## 2023-05-27 NOTE — Discharge Instructions (Addendum)
 It appears you may have a urinary tract infection.  Take Keflex for 5 days as prescribed.  For constipation take MiraLAX twice daily.  You can find this medication at your local pharmacy.  Your CT scan of your abdomen did not show any emergency conditions that would require hospitalization or repeat surgery at this time, fortunately.  It does show some liver changes for which she should follow-up with your primary doctor for further testing as needed.  Follow-up with your regular doctor and your surgeon as scheduled.  Thank you for choosing [place] for your health care today!  Please see your primary doctor this week for a follow up appointment.   If you have any new, worsening, or unexpected symptoms call your doctor right away or come back to the emergency department for reevaluation.  It was my pleasure to care for you today.   Daneil Dan [place] Charon, MD

## 2023-06-03 ENCOUNTER — Encounter: Payer: Self-pay | Admitting: Physician Assistant

## 2023-06-03 ENCOUNTER — Ambulatory Visit: Admitting: Physician Assistant

## 2023-06-03 VITALS — BP 145/82 | HR 111 | Temp 97.6°F | Ht 64.0 in | Wt 195.8 lb

## 2023-06-03 DIAGNOSIS — K8309 Other cholangitis: Secondary | ICD-10-CM

## 2023-06-03 DIAGNOSIS — K8070 Calculus of gallbladder and bile duct without cholecystitis without obstruction: Secondary | ICD-10-CM

## 2023-06-03 DIAGNOSIS — Z09 Encounter for follow-up examination after completed treatment for conditions other than malignant neoplasm: Secondary | ICD-10-CM

## 2023-06-03 NOTE — Progress Notes (Signed)
 [facility] POST-OP OFFICE VISIT  06/03/2023  HPI: Dawn [name] is a 83 y.o. female 20 days s/p robotic assisted laparoscopic cholecystectomy for acute cholangitis and choledocholithiasis as well as umbilical hernia repair with Dr Mauri Sous   She has done well She presents with member of her ALF No abdominal pain , fever, chills, nausea, emesis She is tolerating PO; no diarrhea Incisions are healed Ambulating with cane at baseline No other complaints   Of note, she did have ED visit on 04/08 for UTI. CT Abdomen/Pelvis at that time reviewed and reassuring. CMP showed bilirubin normalized at 0.7.  Vital signs: BP (!) 145/82   Pulse (!) 111   Temp 97.6 F (36.4 C) (Oral)   Ht 5\' 4"  (1.626 m)   Wt 195 lb 12.8 oz (88.8 kg)   SpO2 97%   BMI 33.61 kg/m    Physical Exam: Constitutional: Well appearing female, NAD Abdomen: Soft, non-tender, non-distended, no rebound/guarding Skin: Laparoscopic incisions are healing well, no erythema or drainage   Assessment/Plan: This is a 83 y.o. female 20 days s/p robotic assisted laparoscopic cholecystectomy for acute cholangitis and choledocholithiasis as well as umbilical hernia repair with Dr Mauri Sous    - Pain control prn  - Reviewed wound care recommendation  - Reviewed lifting restrictions; 4-6 weeks total  - Reviewed surgical pathology; CCC  - She can follow up on as needed basis; She understands to call with questions/concerns  -- Apolonio Bay, PA-C [facility] 06/03/2023, 3:16 PM M-F: 7am - 4pm

## 2023-06-03 NOTE — Patient Instructions (Signed)

## 2023-06-10 DIAGNOSIS — E119 Type 2 diabetes mellitus without complications: Secondary | ICD-10-CM | POA: Diagnosis not present

## 2023-06-10 DIAGNOSIS — D519 Vitamin B12 deficiency anemia, unspecified: Secondary | ICD-10-CM | POA: Diagnosis not present

## 2023-06-10 DIAGNOSIS — I70223 Atherosclerosis of native arteries of extremities with rest pain, bilateral legs: Secondary | ICD-10-CM | POA: Diagnosis not present

## 2023-06-10 DIAGNOSIS — E559 Vitamin D deficiency, unspecified: Secondary | ICD-10-CM | POA: Diagnosis not present

## 2023-06-10 DIAGNOSIS — I10 Essential (primary) hypertension: Secondary | ICD-10-CM | POA: Diagnosis not present

## 2023-06-10 DIAGNOSIS — E782 Mixed hyperlipidemia: Secondary | ICD-10-CM | POA: Diagnosis not present

## 2023-06-10 DIAGNOSIS — E038 Other specified hypothyroidism: Secondary | ICD-10-CM | POA: Diagnosis not present

## 2023-06-25 DIAGNOSIS — F03B4 Unspecified dementia, moderate, with anxiety: Secondary | ICD-10-CM | POA: Diagnosis not present

## 2023-06-25 DIAGNOSIS — F339 Major depressive disorder, recurrent, unspecified: Secondary | ICD-10-CM | POA: Diagnosis not present

## 2023-06-25 DIAGNOSIS — F5105 Insomnia due to other mental disorder: Secondary | ICD-10-CM | POA: Diagnosis not present

## 2023-06-25 DIAGNOSIS — F411 Generalized anxiety disorder: Secondary | ICD-10-CM | POA: Diagnosis not present

## 2023-06-25 DIAGNOSIS — F03B3 Unspecified dementia, moderate, with mood disturbance: Secondary | ICD-10-CM | POA: Diagnosis not present

## 2023-06-25 DIAGNOSIS — F4310 Post-traumatic stress disorder, unspecified: Secondary | ICD-10-CM | POA: Diagnosis not present

## 2023-06-25 DIAGNOSIS — F03B18 Unspecified dementia, moderate, with other behavioral disturbance: Secondary | ICD-10-CM | POA: Diagnosis not present

## 2023-06-30 DIAGNOSIS — B372 Candidiasis of skin and nail: Secondary | ICD-10-CM | POA: Diagnosis not present

## 2023-06-30 DIAGNOSIS — R100 Acute abdomen: Secondary | ICD-10-CM | POA: Diagnosis not present

## 2023-06-30 DIAGNOSIS — G8929 Other chronic pain: Secondary | ICD-10-CM | POA: Diagnosis not present

## 2023-06-30 DIAGNOSIS — E119 Type 2 diabetes mellitus without complications: Secondary | ICD-10-CM | POA: Diagnosis not present

## 2023-06-30 DIAGNOSIS — J309 Allergic rhinitis, unspecified: Secondary | ICD-10-CM | POA: Diagnosis not present

## 2023-06-30 DIAGNOSIS — R1310 Dysphagia, unspecified: Secondary | ICD-10-CM | POA: Diagnosis not present

## 2023-06-30 DIAGNOSIS — L659 Nonscarring hair loss, unspecified: Secondary | ICD-10-CM | POA: Diagnosis not present

## 2023-06-30 DIAGNOSIS — M19011 Primary osteoarthritis, right shoulder: Secondary | ICD-10-CM | POA: Diagnosis not present

## 2023-06-30 DIAGNOSIS — L409 Psoriasis, unspecified: Secondary | ICD-10-CM | POA: Diagnosis not present

## 2023-06-30 DIAGNOSIS — K219 Gastro-esophageal reflux disease without esophagitis: Secondary | ICD-10-CM | POA: Diagnosis not present

## 2023-06-30 DIAGNOSIS — F329 Major depressive disorder, single episode, unspecified: Secondary | ICD-10-CM | POA: Diagnosis not present

## 2023-06-30 DIAGNOSIS — I82611 Acute embolism and thrombosis of superficial veins of right upper extremity: Secondary | ICD-10-CM | POA: Diagnosis not present

## 2023-07-07 DIAGNOSIS — G8929 Other chronic pain: Secondary | ICD-10-CM | POA: Diagnosis not present

## 2023-07-07 DIAGNOSIS — N390 Urinary tract infection, site not specified: Secondary | ICD-10-CM | POA: Diagnosis not present

## 2023-07-07 DIAGNOSIS — F329 Major depressive disorder, single episode, unspecified: Secondary | ICD-10-CM | POA: Diagnosis not present

## 2023-07-07 DIAGNOSIS — E119 Type 2 diabetes mellitus without complications: Secondary | ICD-10-CM | POA: Diagnosis not present

## 2023-07-07 DIAGNOSIS — J309 Allergic rhinitis, unspecified: Secondary | ICD-10-CM | POA: Diagnosis not present

## 2023-07-07 DIAGNOSIS — I10 Essential (primary) hypertension: Secondary | ICD-10-CM | POA: Diagnosis not present

## 2023-07-07 DIAGNOSIS — L659 Nonscarring hair loss, unspecified: Secondary | ICD-10-CM | POA: Diagnosis not present

## 2023-07-07 DIAGNOSIS — L409 Psoriasis, unspecified: Secondary | ICD-10-CM | POA: Diagnosis not present

## 2023-07-07 DIAGNOSIS — K219 Gastro-esophageal reflux disease without esophagitis: Secondary | ICD-10-CM | POA: Diagnosis not present

## 2023-07-07 DIAGNOSIS — M19011 Primary osteoarthritis, right shoulder: Secondary | ICD-10-CM | POA: Diagnosis not present

## 2023-07-07 DIAGNOSIS — F4310 Post-traumatic stress disorder, unspecified: Secondary | ICD-10-CM | POA: Diagnosis not present

## 2023-07-08 DIAGNOSIS — E782 Mixed hyperlipidemia: Secondary | ICD-10-CM | POA: Diagnosis not present

## 2023-07-08 DIAGNOSIS — E559 Vitamin D deficiency, unspecified: Secondary | ICD-10-CM | POA: Diagnosis not present

## 2023-07-08 DIAGNOSIS — I70223 Atherosclerosis of native arteries of extremities with rest pain, bilateral legs: Secondary | ICD-10-CM | POA: Diagnosis not present

## 2023-07-08 DIAGNOSIS — E119 Type 2 diabetes mellitus without complications: Secondary | ICD-10-CM | POA: Diagnosis not present

## 2023-07-08 DIAGNOSIS — I10 Essential (primary) hypertension: Secondary | ICD-10-CM | POA: Diagnosis not present

## 2023-07-08 DIAGNOSIS — D519 Vitamin B12 deficiency anemia, unspecified: Secondary | ICD-10-CM | POA: Diagnosis not present

## 2023-07-08 DIAGNOSIS — E038 Other specified hypothyroidism: Secondary | ICD-10-CM | POA: Diagnosis not present

## 2023-07-14 ENCOUNTER — Emergency Department

## 2023-07-14 ENCOUNTER — Emergency Department
Admission: EM | Admit: 2023-07-14 | Discharge: 2023-07-14 | Disposition: A | Discharge status: Home / Self Care | Attending: Emergency Medicine | Admitting: Emergency Medicine

## 2023-07-14 ENCOUNTER — Other Ambulatory Visit: Payer: Self-pay

## 2023-07-14 ENCOUNTER — Encounter: Payer: Self-pay | Admitting: Emergency Medicine

## 2023-07-14 DIAGNOSIS — R079 Chest pain, unspecified: Secondary | ICD-10-CM | POA: Diagnosis not present

## 2023-07-14 DIAGNOSIS — R911 Solitary pulmonary nodule: Secondary | ICD-10-CM | POA: Diagnosis not present

## 2023-07-14 DIAGNOSIS — J189 Pneumonia, unspecified organism: Secondary | ICD-10-CM

## 2023-07-14 DIAGNOSIS — Z743 Need for continuous supervision: Secondary | ICD-10-CM | POA: Diagnosis not present

## 2023-07-14 DIAGNOSIS — I509 Heart failure, unspecified: Secondary | ICD-10-CM | POA: Diagnosis not present

## 2023-07-14 DIAGNOSIS — R918 Other nonspecific abnormal finding of lung field: Secondary | ICD-10-CM

## 2023-07-14 DIAGNOSIS — R0789 Other chest pain: Secondary | ICD-10-CM | POA: Diagnosis not present

## 2023-07-14 DIAGNOSIS — I10 Essential (primary) hypertension: Secondary | ICD-10-CM | POA: Diagnosis not present

## 2023-07-14 DIAGNOSIS — J984 Other disorders of lung: Secondary | ICD-10-CM | POA: Diagnosis not present

## 2023-07-14 LAB — CBC WITH DIFFERENTIAL/PLATELET
Abs Immature Granulocytes: 0.03 10*3/uL (ref 0.00–0.07)
Basophils Absolute: 0 10*3/uL (ref 0.0–0.1)
Basophils Relative: 0 %
Eosinophils Absolute: 0.2 10*3/uL (ref 0.0–0.5)
Eosinophils Relative: 2 %
HCT: 38.6 % (ref 36.0–46.0)
Hemoglobin: 12.3 g/dL (ref 12.0–15.0)
Immature Granulocytes: 0 %
Lymphocytes Relative: 25 %
Lymphs Abs: 2.4 10*3/uL (ref 0.7–4.0)
MCH: 30.2 pg (ref 26.0–34.0)
MCHC: 31.9 g/dL (ref 30.0–36.0)
MCV: 94.8 fL (ref 80.0–100.0)
Monocytes Absolute: 0.8 10*3/uL (ref 0.1–1.0)
Monocytes Relative: 9 %
Neutro Abs: 5.9 10*3/uL (ref 1.7–7.7)
Neutrophils Relative %: 64 %
Platelets: 234 10*3/uL (ref 150–400)
RBC: 4.07 MIL/uL (ref 3.87–5.11)
RDW: 12.9 % (ref 11.5–15.5)
WBC: 9.3 10*3/uL (ref 4.0–10.5)
nRBC: 0 % (ref 0.0–0.2)

## 2023-07-14 LAB — LIPASE, BLOOD: Lipase: 24 U/L (ref 11–51)

## 2023-07-14 LAB — COMPREHENSIVE METABOLIC PANEL WITH GFR
ALT: 22 U/L (ref 0–44)
AST: 18 U/L (ref 15–41)
Albumin: 3.5 g/dL (ref 3.5–5.0)
Alkaline Phosphatase: 75 U/L (ref 38–126)
Anion gap: 7 (ref 5–15)
BUN: 19 mg/dL (ref 8–23)
CO2: 27 mmol/L (ref 22–32)
Calcium: 8.7 mg/dL — ABNORMAL LOW (ref 8.9–10.3)
Chloride: 104 mmol/L (ref 98–111)
Creatinine, Ser: 0.74 mg/dL (ref 0.44–1.00)
GFR, Estimated: >60 mL/min (ref >60)
Glucose, Bld: 124 mg/dL — ABNORMAL HIGH (ref 70–99)
Potassium: 4.1 mmol/L (ref 3.5–5.1)
Sodium: 138 mmol/L (ref 135–145)
Total Bilirubin: 0.4 mg/dL (ref 0.0–1.2)
Total Protein: 6.1 g/dL — ABNORMAL LOW (ref 6.5–8.1)

## 2023-07-14 LAB — TROPONIN I (HIGH SENSITIVITY)
Troponin I (High Sensitivity): 7 ng/L (ref <18)
Troponin I (High Sensitivity): 7 ng/L (ref <18)

## 2023-07-14 MED ORDER — CEFPODOXIME PROXETIL 200 MG PO TABS: 200.0000 mg | ORAL_TABLET | Freq: Two times a day (BID) | ORAL | 0 refills | Status: AC

## 2023-07-14 MED ORDER — ACETAMINOPHEN 500 MG PO TABS: 1000.0000 mg | ORAL_TABLET | Freq: Once | ORAL | Status: DC

## 2023-07-14 MED ORDER — GABAPENTIN 100 MG PO CAPS: 100.0000 mg | ORAL_CAPSULE | Freq: Once | ORAL | Status: DC

## 2023-07-14 MED ORDER — AZITHROMYCIN 250 MG PO TABS
ORAL_TABLET | ORAL | 0 refills | Status: AC
Start: 2023-07-14 — End: 2023-07-19

## 2023-07-14 MED ORDER — CYCLOBENZAPRINE HCL 10 MG PO TABS: 5.0000 mg | ORAL_TABLET | Freq: Once | ORAL | Status: DC

## 2023-07-14 NOTE — ED Provider Notes (Signed)
-----------------------------------------   7:05 AM on 07/14/2023 -----------------------------------------  Blood pressure (!) 156/86, pulse 73, temperature 97.9 F (36.6 C), temperature source Oral, resp. rate 18, height 5\' 4"  (1.626 m), weight 81.6 kg, SpO2 98%.  Assuming care from Dr. Lajuana Pilar.  In short, Dawn [name] is a 83 y.o. female with a chief complaint of Chest Pain .  Refer to the original H&P for additional details.  The current plan of care is to follow-up repeat troponin and CT chest.  ----------------------------------------- 8:06 AM on 07/14/2023 ----------------------------------------- Repeat troponin within normal limits, CT chest shows evidence of atypical pneumonia with associated small nodules that are likely related to pneumonia.  Patient appropriate for outpatient management with antibiotics, was counseled to follow-up with her PCP for repeat imaging and to return to the ED for new or worsening symptoms.  Patient agrees with plan.    Twilla Galea, MD 07/14/23 [phone]

## 2023-07-14 NOTE — ED Provider Notes (Signed)
 [facility] Provider Note    Event Date/Time   First MD Initiated Contact with Patient 07/14/23 0515     (approximate)   History   Chest Pain   HPI Dawn [name] is a 83 y.o. female reports no prior history of cardiac disease although her medical history indicates a history of CHF and "leaky heart valve" (she also has "dementia with behavioral disturbance" listed in her medical history).  Tonight she presents by EMS for evaluation of chest pain.  She reports that 24 hours ago she was awakened around 5 AM with sharp and pressure-like chest pain.  It resolved almost immediately and she had a completely normal day yesterday.  She did not tell anybody about it at the time.  Tonight she also woke up just prior to arrival with chest pain and pressure.  This time she alerted the staff at her living facility and they called EMS.  She said once again that the pain resolved even before EMS arrived.  She was not short of breath, and she denies fever, nausea, vomiting, and abdominal pain.  She reports having a history of panic attacks and anxiety (panic disorder is listed on her medical history).  However she said that she does not feel particularly anxious or worried about anything.  She has had no recent falls nor other trauma.     Physical Exam   Triage Vital Signs: ED Triage Vitals [07/14/23 0520]  Encounter Vitals Group     BP (!) 156/86     Systolic BP Percentile      Diastolic BP Percentile      Pulse Rate 73     Resp 18     Temp 97.9 F (36.6 C)     Temp Source Oral     SpO2 98 %     Weight      Height      Head Circumference      Peak Flow      Pain Score      Pain Loc      Pain Education      Exclude from Growth Chart     Most recent vital signs: Vitals:   07/14/23 0600 07/14/23 0630  BP: (!) 155/84 (!) 142/58  Pulse: 69 67  Resp: 13 17  Temp:    SpO2: 98% 95%    General: Awake, no distress.   The patient's medical screening exam is  reassuring with no indication of an emergent medical condition requiring hospitalization or additional evaluation at this point.  The patient is safe and appropriate for discharge and outpatient follow up. CV:  Good peripheral perfusion. Normal heart sounds. Resp:  Normal effort. Speaking easily and comfortably, no accessory muscle usage nor intercostal retractions.  Lungs clear to auscultation. Abd:  No distention.    ED Results / Procedures / Treatments   Labs (all labs ordered are listed, but only abnormal results are displayed) Labs Reviewed  COMPREHENSIVE METABOLIC PANEL WITH GFR - Abnormal; Notable for the following components:      Result Value   Glucose, Bld 124 (*)    Calcium 8.7 (*)    Total Protein 6.1 (*)    All other components within normal limits  LIPASE, BLOOD  CBC WITH DIFFERENTIAL/PLATELET  TROPONIN I (HIGH SENSITIVITY)  TROPONIN I (HIGH SENSITIVITY)     EKG  ED ECG REPORT I, Lynnda Sas, the attending physician, personally viewed and interpreted this ECG.  Date: 07/14/2023 EKG Time:  Rate: 74 Rhythm: normal sinus rhythm QRS Axis: normal Intervals: normal ST/T Wave abnormalities: Non-specific ST segment / T-wave changes, but no clear evidence of acute ischemia. Narrative Interpretation: no definitive evidence of acute ischemia; does not meet STEMI criteria.    RADIOLOGY See ED course for details   PROCEDURES:  Critical Care performed: No  Procedures    IMPRESSION / MDM / ASSESSMENT AND PLAN / ED COURSE  I reviewed the triage vital signs and the nursing notes.                              Differential diagnosis includes, but is not limited to, nonspecific chest pain, ACS, PE, pneumonia, pneumothorax.  Patient's presentation is most consistent with acute presentation with potential threat to life or bodily function.  Labs/studies ordered: EKG, chest x-ray, lipase, CBC with differential, CMP, high-sensitivity  troponin  Interventions/Medications given:  Medications - No data to display  (Note:  hospital course my include additional interventions and/or labs/studies not listed above.)   Patient with no history of ACS, history of CHF documented in the chart.  She is completely asymptomatic at this time.  It is happened twice in the last 24 hours but it may be associated with anxiety or stress.  Very low risk of PE with Wells score of 0.  Vital signs are reassuring.  The current plan is to perform a standard lab workup including 2 high-sensitivity troponins.  Patient is comfortable with plan for discharge and outpatient follow-up with cardiology if 2 sets of troponins are negative and she remains asymptomatic which I think is very reasonable and appropriate.  I did initially consider hospitalization given her age, but given her relatively low risk and completely asymptomatic presentation I think this is a reasonable alternative and she would prefer to go home if possible.  The patient is on the cardiac monitor to evaluate for evidence of arrhythmia and/or significant heart rate changes.   Clinical Course as of 07/14/23 0711  Tue Jul 14, 2023  0621 [facility] Chest [place] 1 View I independently viewed and interpreted the patient's chest x-ray.  I did not see any evidence of pneumothorax, pneumonia, nor pulmonary edema.  However the radiologist identified some nodular densities that were new since last imaging and recommended a CT chest without contrast for further assessment.  I have ordered the CT scan. [CF]  9562 Troponin I (High Sensitivity): 7 [CF]  1308 For substance abuse troponin is negative and patient is asymptomatic.  CT chest pending.  Transferring ED care to Dr. Cleora Daft. [CF]    Clinical Course User Index [CF] Lynnda Sas, MD     FINAL CLINICAL IMPRESSION(S) / ED DIAGNOSES   Final diagnoses:  Chest pain, unspecified type  Pulmonary nodules     Rx / DC Orders   ED Discharge Orders           Ordered    Ambulatory referral to Cardiology       Comments: If you have not heard from the Cardiology office within the next 72 hours please call [phone].   07/14/23 5284             Note:  This document was prepared using Dragon voice recognition software and may include unintentional dictation errors.   Lynnda Sas, MD 07/14/23 [phone]

## 2023-07-14 NOTE — ED Triage Notes (Signed)
 Patient BIB EMS for evaluation of chest pain.  Reports she had intermittent chest pain earlier in the week and "did not tell anyone."  States tonight's symptoms started around 11 PM.  On arrival patient does not have any current chest pain.  No reports of SHOB, nausea, vomiting, or diarrhea.

## 2023-07-28 DIAGNOSIS — N390 Urinary tract infection, site not specified: Secondary | ICD-10-CM | POA: Diagnosis not present

## 2023-07-28 DIAGNOSIS — K219 Gastro-esophageal reflux disease without esophagitis: Secondary | ICD-10-CM | POA: Diagnosis not present

## 2023-07-28 DIAGNOSIS — M19011 Primary osteoarthritis, right shoulder: Secondary | ICD-10-CM | POA: Diagnosis not present

## 2023-07-28 DIAGNOSIS — F329 Major depressive disorder, single episode, unspecified: Secondary | ICD-10-CM | POA: Diagnosis not present

## 2023-07-28 DIAGNOSIS — F4310 Post-traumatic stress disorder, unspecified: Secondary | ICD-10-CM | POA: Diagnosis not present

## 2023-07-28 DIAGNOSIS — L989 Disorder of the skin and subcutaneous tissue, unspecified: Secondary | ICD-10-CM | POA: Diagnosis not present

## 2023-07-28 DIAGNOSIS — N1330 Unspecified hydronephrosis: Secondary | ICD-10-CM | POA: Diagnosis not present

## 2023-07-28 DIAGNOSIS — F419 Anxiety disorder, unspecified: Secondary | ICD-10-CM | POA: Diagnosis not present

## 2023-07-28 DIAGNOSIS — L659 Nonscarring hair loss, unspecified: Secondary | ICD-10-CM | POA: Diagnosis not present

## 2023-07-28 DIAGNOSIS — G629 Polyneuropathy, unspecified: Secondary | ICD-10-CM | POA: Diagnosis not present

## 2023-07-28 DIAGNOSIS — G8929 Other chronic pain: Secondary | ICD-10-CM | POA: Diagnosis not present

## 2023-07-28 DIAGNOSIS — R918 Other nonspecific abnormal finding of lung field: Secondary | ICD-10-CM | POA: Diagnosis not present

## 2023-07-29 DIAGNOSIS — F03B3 Unspecified dementia, moderate, with mood disturbance: Secondary | ICD-10-CM | POA: Diagnosis not present

## 2023-07-29 DIAGNOSIS — F4310 Post-traumatic stress disorder, unspecified: Secondary | ICD-10-CM | POA: Diagnosis not present

## 2023-07-29 DIAGNOSIS — F339 Major depressive disorder, recurrent, unspecified: Secondary | ICD-10-CM | POA: Diagnosis not present

## 2023-07-29 DIAGNOSIS — F03B18 Unspecified dementia, moderate, with other behavioral disturbance: Secondary | ICD-10-CM | POA: Diagnosis not present

## 2023-07-29 DIAGNOSIS — F411 Generalized anxiety disorder: Secondary | ICD-10-CM | POA: Diagnosis not present

## 2023-07-29 DIAGNOSIS — F03B2 Unspecified dementia, moderate, with psychotic disturbance: Secondary | ICD-10-CM | POA: Diagnosis not present

## 2023-07-29 DIAGNOSIS — F03B11 Unspecified dementia, moderate, with agitation: Secondary | ICD-10-CM | POA: Diagnosis not present

## 2023-07-29 DIAGNOSIS — F5105 Insomnia due to other mental disorder: Secondary | ICD-10-CM | POA: Diagnosis not present

## 2023-07-29 DIAGNOSIS — F03B4 Unspecified dementia, moderate, with anxiety: Secondary | ICD-10-CM | POA: Diagnosis not present

## 2023-08-03 DIAGNOSIS — Z79899 Other long term (current) drug therapy: Secondary | ICD-10-CM | POA: Diagnosis not present

## 2023-08-03 DIAGNOSIS — D519 Vitamin B12 deficiency anemia, unspecified: Secondary | ICD-10-CM | POA: Diagnosis not present

## 2023-08-03 DIAGNOSIS — E119 Type 2 diabetes mellitus without complications: Secondary | ICD-10-CM | POA: Diagnosis not present

## 2023-08-03 DIAGNOSIS — E782 Mixed hyperlipidemia: Secondary | ICD-10-CM | POA: Diagnosis not present

## 2023-08-05 DIAGNOSIS — E119 Type 2 diabetes mellitus without complications: Secondary | ICD-10-CM | POA: Diagnosis not present

## 2023-08-05 DIAGNOSIS — E038 Other specified hypothyroidism: Secondary | ICD-10-CM | POA: Diagnosis not present

## 2023-08-05 DIAGNOSIS — E559 Vitamin D deficiency, unspecified: Secondary | ICD-10-CM | POA: Diagnosis not present

## 2023-08-05 DIAGNOSIS — D519 Vitamin B12 deficiency anemia, unspecified: Secondary | ICD-10-CM | POA: Diagnosis not present

## 2023-08-05 DIAGNOSIS — I10 Essential (primary) hypertension: Secondary | ICD-10-CM | POA: Diagnosis not present

## 2023-08-05 DIAGNOSIS — I70223 Atherosclerosis of native arteries of extremities with rest pain, bilateral legs: Secondary | ICD-10-CM | POA: Diagnosis not present

## 2023-08-05 DIAGNOSIS — E782 Mixed hyperlipidemia: Secondary | ICD-10-CM | POA: Diagnosis not present

## 2023-08-10 DIAGNOSIS — Z79899 Other long term (current) drug therapy: Secondary | ICD-10-CM | POA: Diagnosis not present

## 2023-08-13 ENCOUNTER — Ambulatory Visit (INDEPENDENT_AMBULATORY_CARE_PROVIDER_SITE_OTHER): Admitting: Urology

## 2023-08-13 VITALS — BP 150/80 | HR 76

## 2023-08-13 DIAGNOSIS — N390 Urinary tract infection, site not specified: Secondary | ICD-10-CM

## 2023-08-13 NOTE — Progress Notes (Signed)
   08/13/23 10:09 AM   Dawn [name] 09/12/1940 [id number]  CC:  Hydronephrosis, recurrent UTI  HPI: 83 year old female with dementia who resides in living facility referred for hydronephrosis and recurrent UTI.  There are no outside records available to me.  She is a very poor historian secondary to dementia but denies any complaints today, specifically no flank pain, dysuria, or other urinary symptoms.  There is a positive urine culture from November 2024 when she was evaluated in the ER for back pain, but other than that I do not see any prior positive cultures.  She has had numerous cross-sectional imaging over the last 6 months with multiple CT scans and an MRI, none of which show hydronephrosis.  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  Renal function is normal with creatinine of 0.74, EGFR greater than 60.  Per the aide, it sounds like they have checked her urine monthly and has showed bacteria and she has been put on antibiotics.   PMH: Past Medical History:  Diagnosis Date   CHF (congestive heart failure) (HCC)    Colitis, ischemic (HCC)    Fibromyalgia    Hypertension    Leaky heart valve    Lupus    Osteoarthritis    PTSD (post-traumatic stress disorder)    Rheumatoid arthritis (HCC)     Surgical History: Past Surgical History:  Procedure Laterality Date   ERCP N/A 05/12/2023   Procedure: ERCP, WITH INTERVENTION IF INDICATED;  Surgeon: Jinny Carmine, MD;  Location: [facility] ENDOSCOPY;  Service: Endoscopy;  Laterality: N/A;   TONSILLECTOMY     UMBILICAL HERNIA REPAIR N/A 05/14/2023   Procedure: REPAIR, HERNIA, UMBILICAL, ADULT;  Surgeon: Desiderio Schanz, MD;  Location: [facility] ORS;  Service: General;  Laterality: N/A;    Family History: Family History  Problem Relation Age of Onset   Cancer Mother    Alzheimer's disease Mother    Cancer Father        prostate   ADD / ADHD Son      Social History:  reports that she has never smoked. She does not have any smokeless tobacco history on file. She reports that she does not currently use drugs. She reports that she does not drink alcohol.  Physical Exam: BP (!) 150/80   Pulse 76    Constitutional: Pleasantly confused Cardiovascular: No clubbing, cyanosis, or edema. Respiratory: Normal respiratory effort, no increased work of breathing. GI: Abdomen is soft, nontender, nondistended, no abdominal masses   Laboratory Data: Reviewed, see HPI  Pertinent Imaging: I have personally viewed and interpreted the multiple CT scans and MRI showing no hydronephrosis, parapelvic benign renal cysts are noted, bladder decompressed.  Assessment & Plan:   83 year old female with dementia referred for reported bilateral hydronephrosis and recurrent UTI.  On my review of multiple cross-sectional imaging tests over the last 6 months this is more consistent with benign parapelvic cyst and not hydronephrosis, renal function is normal.  It sounds like they have been treating asymptomatic bacteriuria with antibiotics, and I discussed with the aid as well as filled out the form stressing the difference between true UTI and asymptomatic bacteriuria, and that this does not require treatment with antibiotics.  Reassurance was provided.  Follow-up with urology as needed  [name], MD 08/13/2023  [facility] [address] [place], [place] [address] [phone]

## 2023-08-17 DIAGNOSIS — Z79899 Other long term (current) drug therapy: Secondary | ICD-10-CM | POA: Diagnosis not present

## 2023-08-18 DIAGNOSIS — K219 Gastro-esophageal reflux disease without esophagitis: Secondary | ICD-10-CM | POA: Diagnosis not present

## 2023-08-18 DIAGNOSIS — J309 Allergic rhinitis, unspecified: Secondary | ICD-10-CM | POA: Diagnosis not present

## 2023-08-18 DIAGNOSIS — K760 Fatty (change of) liver, not elsewhere classified: Secondary | ICD-10-CM | POA: Diagnosis not present

## 2023-08-18 DIAGNOSIS — N281 Cyst of kidney, acquired: Secondary | ICD-10-CM | POA: Diagnosis not present

## 2023-08-18 DIAGNOSIS — M19011 Primary osteoarthritis, right shoulder: Secondary | ICD-10-CM | POA: Diagnosis not present

## 2023-08-18 DIAGNOSIS — L659 Nonscarring hair loss, unspecified: Secondary | ICD-10-CM | POA: Diagnosis not present

## 2023-08-18 DIAGNOSIS — I10 Essential (primary) hypertension: Secondary | ICD-10-CM | POA: Diagnosis not present

## 2023-08-18 DIAGNOSIS — L989 Disorder of the skin and subcutaneous tissue, unspecified: Secondary | ICD-10-CM | POA: Diagnosis not present

## 2023-08-18 DIAGNOSIS — N390 Urinary tract infection, site not specified: Secondary | ICD-10-CM | POA: Diagnosis not present

## 2023-08-18 DIAGNOSIS — R918 Other nonspecific abnormal finding of lung field: Secondary | ICD-10-CM | POA: Diagnosis not present

## 2023-08-25 DIAGNOSIS — N390 Urinary tract infection, site not specified: Secondary | ICD-10-CM | POA: Diagnosis not present

## 2023-08-25 DIAGNOSIS — J309 Allergic rhinitis, unspecified: Secondary | ICD-10-CM | POA: Diagnosis not present

## 2023-08-25 DIAGNOSIS — K760 Fatty (change of) liver, not elsewhere classified: Secondary | ICD-10-CM | POA: Diagnosis not present

## 2023-08-25 DIAGNOSIS — L659 Nonscarring hair loss, unspecified: Secondary | ICD-10-CM | POA: Diagnosis not present

## 2023-08-25 DIAGNOSIS — F329 Major depressive disorder, single episode, unspecified: Secondary | ICD-10-CM | POA: Diagnosis not present

## 2023-08-25 DIAGNOSIS — G8929 Other chronic pain: Secondary | ICD-10-CM | POA: Diagnosis not present

## 2023-08-25 DIAGNOSIS — K219 Gastro-esophageal reflux disease without esophagitis: Secondary | ICD-10-CM | POA: Diagnosis not present

## 2023-08-25 DIAGNOSIS — N281 Cyst of kidney, acquired: Secondary | ICD-10-CM | POA: Diagnosis not present

## 2023-08-25 DIAGNOSIS — M545 Low back pain, unspecified: Secondary | ICD-10-CM | POA: Diagnosis not present

## 2023-08-25 DIAGNOSIS — I10 Essential (primary) hypertension: Secondary | ICD-10-CM | POA: Diagnosis not present

## 2023-08-25 DIAGNOSIS — M19011 Primary osteoarthritis, right shoulder: Secondary | ICD-10-CM | POA: Diagnosis not present

## 2023-08-25 DIAGNOSIS — L989 Disorder of the skin and subcutaneous tissue, unspecified: Secondary | ICD-10-CM | POA: Diagnosis not present

## 2023-08-31 DIAGNOSIS — R911 Solitary pulmonary nodule: Secondary | ICD-10-CM | POA: Diagnosis not present

## 2023-09-01 DIAGNOSIS — E559 Vitamin D deficiency, unspecified: Secondary | ICD-10-CM | POA: Diagnosis not present

## 2023-09-01 DIAGNOSIS — I10 Essential (primary) hypertension: Secondary | ICD-10-CM | POA: Diagnosis not present

## 2023-09-01 DIAGNOSIS — E038 Other specified hypothyroidism: Secondary | ICD-10-CM | POA: Diagnosis not present

## 2023-09-01 DIAGNOSIS — D519 Vitamin B12 deficiency anemia, unspecified: Secondary | ICD-10-CM | POA: Diagnosis not present

## 2023-09-01 DIAGNOSIS — E119 Type 2 diabetes mellitus without complications: Secondary | ICD-10-CM | POA: Diagnosis not present

## 2023-09-01 DIAGNOSIS — E782 Mixed hyperlipidemia: Secondary | ICD-10-CM | POA: Diagnosis not present

## 2023-09-02 DIAGNOSIS — F411 Generalized anxiety disorder: Secondary | ICD-10-CM | POA: Diagnosis not present

## 2023-09-02 DIAGNOSIS — F339 Major depressive disorder, recurrent, unspecified: Secondary | ICD-10-CM | POA: Diagnosis not present

## 2023-09-02 DIAGNOSIS — F4310 Post-traumatic stress disorder, unspecified: Secondary | ICD-10-CM | POA: Diagnosis not present

## 2023-09-02 DIAGNOSIS — F03B2 Unspecified dementia, moderate, with psychotic disturbance: Secondary | ICD-10-CM | POA: Diagnosis not present

## 2023-09-02 DIAGNOSIS — F03B3 Unspecified dementia, moderate, with mood disturbance: Secondary | ICD-10-CM | POA: Diagnosis not present

## 2023-09-02 DIAGNOSIS — F39 Unspecified mood [affective] disorder: Secondary | ICD-10-CM | POA: Diagnosis not present

## 2023-09-02 DIAGNOSIS — F03B18 Unspecified dementia, moderate, with other behavioral disturbance: Secondary | ICD-10-CM | POA: Diagnosis not present

## 2023-09-02 DIAGNOSIS — F03B11 Unspecified dementia, moderate, with agitation: Secondary | ICD-10-CM | POA: Diagnosis not present

## 2023-09-02 DIAGNOSIS — F03B4 Unspecified dementia, moderate, with anxiety: Secondary | ICD-10-CM | POA: Diagnosis not present

## 2023-09-02 DIAGNOSIS — F5105 Insomnia due to other mental disorder: Secondary | ICD-10-CM | POA: Diagnosis not present

## 2023-09-06 ENCOUNTER — Emergency Department
Admission: EM | Admit: 2023-09-06 | Discharge: 2023-09-06 | Disposition: A | Discharge status: Home / Self Care | Attending: Emergency Medicine | Admitting: Emergency Medicine

## 2023-09-06 ENCOUNTER — Other Ambulatory Visit: Payer: Self-pay

## 2023-09-06 DIAGNOSIS — I509 Heart failure, unspecified: Secondary | ICD-10-CM | POA: Insufficient documentation

## 2023-09-06 DIAGNOSIS — I110 Hypertensive heart disease with heart failure: Secondary | ICD-10-CM | POA: Insufficient documentation

## 2023-09-06 DIAGNOSIS — R0789 Other chest pain: Secondary | ICD-10-CM | POA: Diagnosis not present

## 2023-09-06 DIAGNOSIS — Z0000 Encounter for general adult medical examination without abnormal findings: Secondary | ICD-10-CM | POA: Insufficient documentation

## 2023-09-06 DIAGNOSIS — R5383 Other fatigue: Secondary | ICD-10-CM | POA: Diagnosis not present

## 2023-09-06 LAB — CBC
HCT: 41.2 % (ref 36.0–46.0)
Hemoglobin: 13.6 g/dL (ref 12.0–15.0)
MCH: 31.1 pg (ref 26.0–34.0)
MCHC: 33 g/dL (ref 30.0–36.0)
MCV: 94.1 fL (ref 80.0–100.0)
Platelets: 275 K/uL (ref 150–400)
RBC: 4.38 MIL/uL (ref 3.87–5.11)
RDW: 13 % (ref 11.5–15.5)
WBC: 13.1 K/uL — ABNORMAL HIGH (ref 4.0–10.5)
nRBC: 0 % (ref 0.0–0.2)

## 2023-09-06 LAB — COMPREHENSIVE METABOLIC PANEL WITH GFR
ALT: 19 U/L (ref 0–44)
AST: 24 U/L (ref 15–41)
Albumin: 3.6 g/dL (ref 3.5–5.0)
Alkaline Phosphatase: 76 U/L (ref 38–126)
Anion gap: 11 (ref 5–15)
BUN: 18 mg/dL (ref 8–23)
CO2: 21 mmol/L — ABNORMAL LOW (ref 22–32)
Calcium: 9.2 mg/dL (ref 8.9–10.3)
Chloride: 102 mmol/L (ref 98–111)
Creatinine, Ser: 0.83 mg/dL (ref 0.44–1.00)
GFR, Estimated: >60 mL/min (ref >60)
Glucose, Bld: 211 mg/dL — ABNORMAL HIGH (ref 70–99)
Potassium: 4.1 mmol/L (ref 3.5–5.1)
Sodium: 134 mmol/L — ABNORMAL LOW (ref 135–145)
Total Bilirubin: 0.8 mg/dL (ref 0.0–1.2)
Total Protein: 6.8 g/dL (ref 6.5–8.1)

## 2023-09-06 NOTE — ED Notes (Signed)
 Patient denies any new urinary symptoms at this time. Last BM and void was this morning.

## 2023-09-06 NOTE — ED Provider Notes (Signed)
 [facility] Provider Note    Event Date/Time   First MD Initiated Contact with Patient 09/06/23 1344     (approximate)  History   Chief Complaint: Fatigue  HPI  Shanette M [place] is a 83 y.o. female with a past medical history of CHF, hypertension, fibromyalgia, presents to the emergency department with complaints about her nursing facility.  According to EMS patient called from [facility].  Nursing staff at the facility was unaware that the patient had called EMS.  Patient states her main complaint is that the facility did not dose her her venlafaxine  medication today and they were refusing to give it to her.  When asked if anything else is bothering her she states I hate where I live.  Patient denies any medical complaints.  Denies any chest pain abdominal pain shortness of breath cough congestion fever vomiting diarrhea.  Reassuring vital signs.  Physical Exam   Triage Vital Signs: ED Triage Vitals  Encounter Vitals Group     BP 09/06/23 1341 (!) 175/95     Girls Systolic BP Percentile --      Girls Diastolic BP Percentile --      Boys Systolic BP Percentile --      Boys Diastolic BP Percentile --      Pulse Rate 09/06/23 1341 100     Resp 09/06/23 1341 20     Temp 09/06/23 1341 98.6 F (37 C)     Temp Source 09/06/23 1341 Oral     SpO2 09/06/23 1341 98 %     Weight 09/06/23 1343 180 lb (81.6 kg)     Height 09/06/23 1343 5' 4 (1.626 m)     Head Circumference --      Peak Flow --      Pain Score 09/06/23 1339 0     Pain Loc --      Pain Education --      Exclude from Growth Chart --     Most recent vital signs: Vitals:   09/06/23 1341 09/06/23 1430  BP: (!) 175/95 (!) 154/73  Pulse: 100 92  Resp: 20 19  Temp: 98.6 F (37 C)   SpO2: 98% 96%    General: Awake, no distress.  CV:  Good peripheral perfusion.  Regular rate and rhythm  Resp:  Normal effort.  Equal breath sounds bilaterally.  Abd:  No distention.  Soft,  nontender.  No rebound or guarding.  ED Results / Procedures / Treatments   EKG  EKG viewed and interpreted by myself shows a sinus rhythm at 100 bpm with a narrow QRS, normal axis, normal intervals, no concerning ST changes.  MEDICATIONS ORDERED IN ED: Medications - No data to display   IMPRESSION / MDM / ASSESSMENT AND PLAN / ED COURSE  I reviewed the triage vital signs and the nursing notes.  Patient's presentation is most consistent with acute presentation with potential threat to life or bodily function.  Patient presents to the emergency department for complaints about her nursing facility.  Patient states that her nursing facility refused to give her her typical medication today.  We have attempted to call the nursing facility several times without success.  Nurse was able to reach the patient's daughter who states she has done this previously, states she is not happy with her nursing home.  Patient's workup in the emergency department shows a reassuring exam reassuring vital signs, normal CBC reassuring chemistry.  FINAL CLINICAL IMPRESSION(S) / ED DIAGNOSES  Medical examination  Note:  This document was prepared using Dragon voice recognition software and may include unintentional dictation errors.   Dorothyann Drivers, MD 09/06/23 1454

## 2023-09-06 NOTE — ED Notes (Signed)
 Called Daughter Who provided these two different numbers   [phone]  [phone]

## 2023-09-06 NOTE — ED Notes (Signed)
 Called daughter who gave RN the number to Brad who is the patient's son. The son said they would attempt to work something out to try and pick her up. Patient updated on POC, patient agreed to this plan.

## 2023-09-06 NOTE — ED Notes (Addendum)
 No one answered at [facility]. Daughter answered phone and situation explained.

## 2023-09-06 NOTE — ED Triage Notes (Signed)
 From [facility]. Patient here for general malaise, patient called [phone] not facility.  Patient states that the facility has not been giving her home medications. 150/69, 184CBG, 103HR,   Patient states that facility is with holding medications and they refused to give them to her this morning. Patient complains that she felt dizzy and close to falling.  AOX4

## 2023-09-06 NOTE — ED Notes (Signed)
 Attempted report to assisted Living no one picked up the phone.

## 2023-09-06 NOTE — ED Notes (Signed)
 EDP at bedside. Telling MD that facility threw her medication away yesterday.

## 2023-09-06 NOTE — ED Notes (Signed)
 Attempted to call both numbers for [facility]. No answer.

## 2023-09-08 DIAGNOSIS — I499 Cardiac arrhythmia, unspecified: Secondary | ICD-10-CM | POA: Diagnosis not present

## 2023-09-09 DIAGNOSIS — L600 Ingrowing nail: Secondary | ICD-10-CM | POA: Diagnosis not present

## 2023-09-09 DIAGNOSIS — E1140 Type 2 diabetes mellitus with diabetic neuropathy, unspecified: Secondary | ICD-10-CM | POA: Diagnosis not present

## 2023-09-09 DIAGNOSIS — M79671 Pain in right foot: Secondary | ICD-10-CM | POA: Diagnosis not present

## 2023-09-09 DIAGNOSIS — B351 Tinea unguium: Secondary | ICD-10-CM | POA: Diagnosis not present

## 2023-09-14 DIAGNOSIS — E612 Magnesium deficiency: Secondary | ICD-10-CM | POA: Diagnosis not present

## 2023-09-14 DIAGNOSIS — D519 Vitamin B12 deficiency anemia, unspecified: Secondary | ICD-10-CM | POA: Diagnosis not present

## 2023-09-14 DIAGNOSIS — Z79899 Other long term (current) drug therapy: Secondary | ICD-10-CM | POA: Diagnosis not present

## 2023-09-14 DIAGNOSIS — E782 Mixed hyperlipidemia: Secondary | ICD-10-CM | POA: Diagnosis not present

## 2023-09-14 DIAGNOSIS — E119 Type 2 diabetes mellitus without complications: Secondary | ICD-10-CM | POA: Diagnosis not present

## 2023-09-18 DIAGNOSIS — R079 Chest pain, unspecified: Secondary | ICD-10-CM | POA: Insufficient documentation

## 2023-09-18 NOTE — Progress Notes (Deleted)
 NO SHOW

## 2023-09-21 ENCOUNTER — Ambulatory Visit: Attending: Cardiovascular Disease | Admitting: Cardiovascular Disease

## 2023-09-21 DIAGNOSIS — F4310 Post-traumatic stress disorder, unspecified: Secondary | ICD-10-CM

## 2023-09-21 DIAGNOSIS — E1140 Type 2 diabetes mellitus with diabetic neuropathy, unspecified: Secondary | ICD-10-CM

## 2023-09-21 DIAGNOSIS — I10 Essential (primary) hypertension: Secondary | ICD-10-CM

## 2023-09-21 DIAGNOSIS — R079 Chest pain, unspecified: Secondary | ICD-10-CM

## 2023-09-21 DIAGNOSIS — F410 Panic disorder [episodic paroxysmal anxiety] without agoraphobia: Secondary | ICD-10-CM

## 2023-09-22 DIAGNOSIS — R918 Other nonspecific abnormal finding of lung field: Secondary | ICD-10-CM | POA: Diagnosis not present

## 2023-09-22 DIAGNOSIS — E119 Type 2 diabetes mellitus without complications: Secondary | ICD-10-CM | POA: Diagnosis not present

## 2023-09-22 DIAGNOSIS — N281 Cyst of kidney, acquired: Secondary | ICD-10-CM | POA: Diagnosis not present

## 2023-09-22 DIAGNOSIS — L659 Nonscarring hair loss, unspecified: Secondary | ICD-10-CM | POA: Diagnosis not present

## 2023-09-22 DIAGNOSIS — R002 Palpitations: Secondary | ICD-10-CM | POA: Diagnosis not present

## 2023-09-22 DIAGNOSIS — I10 Essential (primary) hypertension: Secondary | ICD-10-CM | POA: Diagnosis not present

## 2023-09-22 DIAGNOSIS — L989 Disorder of the skin and subcutaneous tissue, unspecified: Secondary | ICD-10-CM | POA: Diagnosis not present

## 2023-09-22 DIAGNOSIS — G8929 Other chronic pain: Secondary | ICD-10-CM | POA: Diagnosis not present

## 2023-09-22 DIAGNOSIS — F419 Anxiety disorder, unspecified: Secondary | ICD-10-CM | POA: Diagnosis not present

## 2023-09-22 DIAGNOSIS — M545 Low back pain, unspecified: Secondary | ICD-10-CM | POA: Diagnosis not present

## 2023-09-22 DIAGNOSIS — G629 Polyneuropathy, unspecified: Secondary | ICD-10-CM | POA: Diagnosis not present

## 2023-10-02 ENCOUNTER — Ambulatory Visit: Admitting: Cardiology

## 2023-10-05 DIAGNOSIS — F39 Unspecified mood [affective] disorder: Secondary | ICD-10-CM | POA: Diagnosis not present

## 2023-10-05 DIAGNOSIS — F0394 Unspecified dementia, unspecified severity, with anxiety: Secondary | ICD-10-CM | POA: Diagnosis not present

## 2023-10-05 DIAGNOSIS — F0393 Unspecified dementia, unspecified severity, with mood disturbance: Secondary | ICD-10-CM | POA: Diagnosis not present

## 2023-10-05 DIAGNOSIS — F4310 Post-traumatic stress disorder, unspecified: Secondary | ICD-10-CM | POA: Diagnosis not present

## 2023-10-05 DIAGNOSIS — F411 Generalized anxiety disorder: Secondary | ICD-10-CM | POA: Diagnosis not present

## 2023-10-05 DIAGNOSIS — F5104 Psychophysiologic insomnia: Secondary | ICD-10-CM | POA: Diagnosis not present

## 2023-10-05 DIAGNOSIS — F03918 Unspecified dementia, unspecified severity, with other behavioral disturbance: Secondary | ICD-10-CM | POA: Diagnosis not present

## 2023-10-06 DIAGNOSIS — D519 Vitamin B12 deficiency anemia, unspecified: Secondary | ICD-10-CM | POA: Diagnosis not present

## 2023-10-06 DIAGNOSIS — I10 Essential (primary) hypertension: Secondary | ICD-10-CM | POA: Diagnosis not present

## 2023-10-06 DIAGNOSIS — E119 Type 2 diabetes mellitus without complications: Secondary | ICD-10-CM | POA: Diagnosis not present

## 2023-10-06 DIAGNOSIS — E782 Mixed hyperlipidemia: Secondary | ICD-10-CM | POA: Diagnosis not present

## 2023-10-06 DIAGNOSIS — E559 Vitamin D deficiency, unspecified: Secondary | ICD-10-CM | POA: Diagnosis not present

## 2023-10-06 DIAGNOSIS — E038 Other specified hypothyroidism: Secondary | ICD-10-CM | POA: Diagnosis not present

## 2023-10-15 DIAGNOSIS — H2513 Age-related nuclear cataract, bilateral: Secondary | ICD-10-CM | POA: Diagnosis not present

## 2023-10-16 DIAGNOSIS — F5105 Insomnia due to other mental disorder: Secondary | ICD-10-CM | POA: Diagnosis not present

## 2023-10-16 DIAGNOSIS — F03B4 Unspecified dementia, moderate, with anxiety: Secondary | ICD-10-CM | POA: Diagnosis not present

## 2023-10-16 DIAGNOSIS — F03B3 Unspecified dementia, moderate, with mood disturbance: Secondary | ICD-10-CM | POA: Diagnosis not present

## 2023-10-16 DIAGNOSIS — F03B11 Unspecified dementia, moderate, with agitation: Secondary | ICD-10-CM | POA: Diagnosis not present

## 2023-10-16 DIAGNOSIS — F411 Generalized anxiety disorder: Secondary | ICD-10-CM | POA: Diagnosis not present

## 2023-10-16 DIAGNOSIS — F03B18 Unspecified dementia, moderate, with other behavioral disturbance: Secondary | ICD-10-CM | POA: Diagnosis not present

## 2023-10-16 DIAGNOSIS — F4310 Post-traumatic stress disorder, unspecified: Secondary | ICD-10-CM | POA: Diagnosis not present

## 2023-10-16 DIAGNOSIS — F339 Major depressive disorder, recurrent, unspecified: Secondary | ICD-10-CM | POA: Diagnosis not present

## 2023-10-27 DIAGNOSIS — F329 Major depressive disorder, single episode, unspecified: Secondary | ICD-10-CM | POA: Diagnosis not present

## 2023-10-27 DIAGNOSIS — M19011 Primary osteoarthritis, right shoulder: Secondary | ICD-10-CM | POA: Diagnosis not present

## 2023-10-27 DIAGNOSIS — N281 Cyst of kidney, acquired: Secondary | ICD-10-CM | POA: Diagnosis not present

## 2023-10-27 DIAGNOSIS — I509 Heart failure, unspecified: Secondary | ICD-10-CM | POA: Diagnosis not present

## 2023-10-27 DIAGNOSIS — L659 Nonscarring hair loss, unspecified: Secondary | ICD-10-CM | POA: Diagnosis not present

## 2023-10-27 DIAGNOSIS — E119 Type 2 diabetes mellitus without complications: Secondary | ICD-10-CM | POA: Diagnosis not present

## 2023-10-27 DIAGNOSIS — G629 Polyneuropathy, unspecified: Secondary | ICD-10-CM | POA: Diagnosis not present

## 2023-10-27 DIAGNOSIS — G8929 Other chronic pain: Secondary | ICD-10-CM | POA: Diagnosis not present

## 2023-10-27 DIAGNOSIS — K219 Gastro-esophageal reflux disease without esophagitis: Secondary | ICD-10-CM | POA: Diagnosis not present

## 2023-10-27 DIAGNOSIS — R918 Other nonspecific abnormal finding of lung field: Secondary | ICD-10-CM | POA: Diagnosis not present

## 2023-10-27 DIAGNOSIS — I10 Essential (primary) hypertension: Secondary | ICD-10-CM | POA: Diagnosis not present

## 2023-10-27 DIAGNOSIS — J309 Allergic rhinitis, unspecified: Secondary | ICD-10-CM | POA: Diagnosis not present

## 2023-10-27 DIAGNOSIS — K760 Fatty (change of) liver, not elsewhere classified: Secondary | ICD-10-CM | POA: Diagnosis not present

## 2023-11-03 DIAGNOSIS — G629 Polyneuropathy, unspecified: Secondary | ICD-10-CM | POA: Diagnosis not present

## 2023-11-03 DIAGNOSIS — N281 Cyst of kidney, acquired: Secondary | ICD-10-CM | POA: Diagnosis not present

## 2023-11-03 DIAGNOSIS — F419 Anxiety disorder, unspecified: Secondary | ICD-10-CM | POA: Diagnosis not present

## 2023-11-03 DIAGNOSIS — K219 Gastro-esophageal reflux disease without esophagitis: Secondary | ICD-10-CM | POA: Diagnosis not present

## 2023-11-03 DIAGNOSIS — L659 Nonscarring hair loss, unspecified: Secondary | ICD-10-CM | POA: Diagnosis not present

## 2023-11-03 DIAGNOSIS — E119 Type 2 diabetes mellitus without complications: Secondary | ICD-10-CM | POA: Diagnosis not present

## 2023-11-03 DIAGNOSIS — M545 Low back pain, unspecified: Secondary | ICD-10-CM | POA: Diagnosis not present

## 2023-11-03 DIAGNOSIS — R918 Other nonspecific abnormal finding of lung field: Secondary | ICD-10-CM | POA: Diagnosis not present

## 2023-11-03 DIAGNOSIS — L989 Disorder of the skin and subcutaneous tissue, unspecified: Secondary | ICD-10-CM | POA: Diagnosis not present

## 2023-11-03 DIAGNOSIS — G8929 Other chronic pain: Secondary | ICD-10-CM | POA: Diagnosis not present

## 2023-11-05 DIAGNOSIS — F03B3 Unspecified dementia, moderate, with mood disturbance: Secondary | ICD-10-CM | POA: Diagnosis not present

## 2023-11-05 DIAGNOSIS — F411 Generalized anxiety disorder: Secondary | ICD-10-CM | POA: Diagnosis not present

## 2023-11-05 DIAGNOSIS — F4310 Post-traumatic stress disorder, unspecified: Secondary | ICD-10-CM | POA: Diagnosis not present

## 2023-11-05 DIAGNOSIS — F339 Major depressive disorder, recurrent, unspecified: Secondary | ICD-10-CM | POA: Diagnosis not present

## 2023-11-05 DIAGNOSIS — F03B4 Unspecified dementia, moderate, with anxiety: Secondary | ICD-10-CM | POA: Diagnosis not present

## 2023-11-05 DIAGNOSIS — F03B18 Unspecified dementia, moderate, with other behavioral disturbance: Secondary | ICD-10-CM | POA: Diagnosis not present

## 2023-11-10 DIAGNOSIS — E119 Type 2 diabetes mellitus without complications: Secondary | ICD-10-CM | POA: Diagnosis not present

## 2023-11-10 DIAGNOSIS — D519 Vitamin B12 deficiency anemia, unspecified: Secondary | ICD-10-CM | POA: Diagnosis not present

## 2023-11-10 DIAGNOSIS — I70223 Atherosclerosis of native arteries of extremities with rest pain, bilateral legs: Secondary | ICD-10-CM | POA: Diagnosis not present

## 2023-11-10 DIAGNOSIS — E559 Vitamin D deficiency, unspecified: Secondary | ICD-10-CM | POA: Diagnosis not present

## 2023-11-10 DIAGNOSIS — I509 Heart failure, unspecified: Secondary | ICD-10-CM | POA: Diagnosis not present

## 2023-11-10 DIAGNOSIS — E782 Mixed hyperlipidemia: Secondary | ICD-10-CM | POA: Diagnosis not present

## 2023-11-10 DIAGNOSIS — I10 Essential (primary) hypertension: Secondary | ICD-10-CM | POA: Diagnosis not present

## 2023-11-10 DIAGNOSIS — E038 Other specified hypothyroidism: Secondary | ICD-10-CM | POA: Diagnosis not present

## 2023-11-23 DIAGNOSIS — Z79899 Other long term (current) drug therapy: Secondary | ICD-10-CM | POA: Diagnosis not present

## 2023-11-23 DIAGNOSIS — D519 Vitamin B12 deficiency anemia, unspecified: Secondary | ICD-10-CM | POA: Diagnosis not present

## 2023-11-23 DIAGNOSIS — E559 Vitamin D deficiency, unspecified: Secondary | ICD-10-CM | POA: Diagnosis not present

## 2023-11-23 DIAGNOSIS — E038 Other specified hypothyroidism: Secondary | ICD-10-CM | POA: Diagnosis not present

## 2023-11-23 DIAGNOSIS — E119 Type 2 diabetes mellitus without complications: Secondary | ICD-10-CM | POA: Diagnosis not present

## 2023-11-23 DIAGNOSIS — E782 Mixed hyperlipidemia: Secondary | ICD-10-CM | POA: Diagnosis not present

## 2023-12-01 DIAGNOSIS — M19011 Primary osteoarthritis, right shoulder: Secondary | ICD-10-CM | POA: Diagnosis not present

## 2023-12-01 DIAGNOSIS — G8929 Other chronic pain: Secondary | ICD-10-CM | POA: Diagnosis not present

## 2023-12-01 DIAGNOSIS — N281 Cyst of kidney, acquired: Secondary | ICD-10-CM | POA: Diagnosis not present

## 2023-12-01 DIAGNOSIS — K760 Fatty (change of) liver, not elsewhere classified: Secondary | ICD-10-CM | POA: Diagnosis not present

## 2023-12-01 DIAGNOSIS — L659 Nonscarring hair loss, unspecified: Secondary | ICD-10-CM | POA: Diagnosis not present

## 2023-12-01 DIAGNOSIS — R918 Other nonspecific abnormal finding of lung field: Secondary | ICD-10-CM | POA: Diagnosis not present

## 2023-12-01 DIAGNOSIS — K219 Gastro-esophageal reflux disease without esophagitis: Secondary | ICD-10-CM | POA: Diagnosis not present

## 2023-12-01 DIAGNOSIS — M545 Low back pain, unspecified: Secondary | ICD-10-CM | POA: Diagnosis not present

## 2023-12-01 DIAGNOSIS — I10 Essential (primary) hypertension: Secondary | ICD-10-CM | POA: Diagnosis not present

## 2023-12-01 DIAGNOSIS — J309 Allergic rhinitis, unspecified: Secondary | ICD-10-CM | POA: Diagnosis not present

## 2023-12-01 DIAGNOSIS — L989 Disorder of the skin and subcutaneous tissue, unspecified: Secondary | ICD-10-CM | POA: Diagnosis not present

## 2023-12-02 DIAGNOSIS — F03B18 Unspecified dementia, moderate, with other behavioral disturbance: Secondary | ICD-10-CM | POA: Diagnosis not present

## 2023-12-02 DIAGNOSIS — F4310 Post-traumatic stress disorder, unspecified: Secondary | ICD-10-CM | POA: Diagnosis not present

## 2023-12-02 DIAGNOSIS — F5104 Psychophysiologic insomnia: Secondary | ICD-10-CM | POA: Diagnosis not present

## 2023-12-02 DIAGNOSIS — F03B4 Unspecified dementia, moderate, with anxiety: Secondary | ICD-10-CM | POA: Diagnosis not present

## 2023-12-02 DIAGNOSIS — F339 Major depressive disorder, recurrent, unspecified: Secondary | ICD-10-CM | POA: Diagnosis not present

## 2023-12-02 DIAGNOSIS — F03B3 Unspecified dementia, moderate, with mood disturbance: Secondary | ICD-10-CM | POA: Diagnosis not present

## 2023-12-02 DIAGNOSIS — F411 Generalized anxiety disorder: Secondary | ICD-10-CM | POA: Diagnosis not present

## 2023-12-08 DIAGNOSIS — L600 Ingrowing nail: Secondary | ICD-10-CM | POA: Diagnosis not present

## 2023-12-08 DIAGNOSIS — M79671 Pain in right foot: Secondary | ICD-10-CM | POA: Diagnosis not present

## 2023-12-08 DIAGNOSIS — B351 Tinea unguium: Secondary | ICD-10-CM | POA: Diagnosis not present

## 2023-12-08 DIAGNOSIS — E1140 Type 2 diabetes mellitus with diabetic neuropathy, unspecified: Secondary | ICD-10-CM | POA: Diagnosis not present

## 2023-12-09 DIAGNOSIS — I70223 Atherosclerosis of native arteries of extremities with rest pain, bilateral legs: Secondary | ICD-10-CM | POA: Diagnosis not present

## 2023-12-09 DIAGNOSIS — D519 Vitamin B12 deficiency anemia, unspecified: Secondary | ICD-10-CM | POA: Diagnosis not present

## 2023-12-09 DIAGNOSIS — I509 Heart failure, unspecified: Secondary | ICD-10-CM | POA: Diagnosis not present

## 2023-12-09 DIAGNOSIS — E038 Other specified hypothyroidism: Secondary | ICD-10-CM | POA: Diagnosis not present

## 2023-12-09 DIAGNOSIS — I10 Essential (primary) hypertension: Secondary | ICD-10-CM | POA: Diagnosis not present

## 2023-12-09 DIAGNOSIS — E782 Mixed hyperlipidemia: Secondary | ICD-10-CM | POA: Diagnosis not present

## 2023-12-09 DIAGNOSIS — E559 Vitamin D deficiency, unspecified: Secondary | ICD-10-CM | POA: Diagnosis not present

## 2023-12-09 DIAGNOSIS — E119 Type 2 diabetes mellitus without complications: Secondary | ICD-10-CM | POA: Diagnosis not present

## 2023-12-29 DIAGNOSIS — R918 Other nonspecific abnormal finding of lung field: Secondary | ICD-10-CM | POA: Diagnosis not present

## 2023-12-29 DIAGNOSIS — L989 Disorder of the skin and subcutaneous tissue, unspecified: Secondary | ICD-10-CM | POA: Diagnosis not present

## 2023-12-29 DIAGNOSIS — N281 Cyst of kidney, acquired: Secondary | ICD-10-CM | POA: Diagnosis not present

## 2023-12-29 DIAGNOSIS — I10 Essential (primary) hypertension: Secondary | ICD-10-CM | POA: Diagnosis not present

## 2023-12-29 DIAGNOSIS — L409 Psoriasis, unspecified: Secondary | ICD-10-CM | POA: Diagnosis not present

## 2023-12-29 DIAGNOSIS — G8929 Other chronic pain: Secondary | ICD-10-CM | POA: Diagnosis not present

## 2023-12-29 DIAGNOSIS — L659 Nonscarring hair loss, unspecified: Secondary | ICD-10-CM | POA: Diagnosis not present

## 2023-12-29 DIAGNOSIS — G629 Polyneuropathy, unspecified: Secondary | ICD-10-CM | POA: Diagnosis not present

## 2023-12-29 DIAGNOSIS — K760 Fatty (change of) liver, not elsewhere classified: Secondary | ICD-10-CM | POA: Diagnosis not present

## 2023-12-29 DIAGNOSIS — K219 Gastro-esophageal reflux disease without esophagitis: Secondary | ICD-10-CM | POA: Diagnosis not present

## 2023-12-29 DIAGNOSIS — E119 Type 2 diabetes mellitus without complications: Secondary | ICD-10-CM | POA: Diagnosis not present

## 2023-12-29 DIAGNOSIS — J309 Allergic rhinitis, unspecified: Secondary | ICD-10-CM | POA: Diagnosis not present

## 2024-01-01 DIAGNOSIS — L989 Disorder of the skin and subcutaneous tissue, unspecified: Secondary | ICD-10-CM | POA: Diagnosis not present

## 2024-01-01 DIAGNOSIS — E669 Obesity, unspecified: Secondary | ICD-10-CM | POA: Diagnosis not present

## 2024-01-01 DIAGNOSIS — Z5982 Transportation insecurity: Secondary | ICD-10-CM | POA: Diagnosis not present

## 2024-01-01 DIAGNOSIS — K760 Fatty (change of) liver, not elsewhere classified: Secondary | ICD-10-CM | POA: Diagnosis not present

## 2024-01-01 DIAGNOSIS — L409 Psoriasis, unspecified: Secondary | ICD-10-CM | POA: Diagnosis not present

## 2024-01-01 DIAGNOSIS — L659 Nonscarring hair loss, unspecified: Secondary | ICD-10-CM | POA: Diagnosis not present

## 2024-01-01 DIAGNOSIS — Z7984 Long term (current) use of oral hypoglycemic drugs: Secondary | ICD-10-CM | POA: Diagnosis not present

## 2024-01-01 DIAGNOSIS — F329 Major depressive disorder, single episode, unspecified: Secondary | ICD-10-CM | POA: Diagnosis not present

## 2024-01-01 DIAGNOSIS — M51369 Other intervertebral disc degeneration, lumbar region without mention of lumbar back pain or lower extremity pain: Secondary | ICD-10-CM | POA: Diagnosis not present

## 2024-01-01 DIAGNOSIS — G8929 Other chronic pain: Secondary | ICD-10-CM | POA: Diagnosis not present

## 2024-01-01 DIAGNOSIS — M1990 Unspecified osteoarthritis, unspecified site: Secondary | ICD-10-CM | POA: Diagnosis not present

## 2024-01-01 DIAGNOSIS — Z9089 Acquired absence of other organs: Secondary | ICD-10-CM | POA: Diagnosis not present

## 2024-01-01 DIAGNOSIS — E1140 Type 2 diabetes mellitus with diabetic neuropathy, unspecified: Secondary | ICD-10-CM | POA: Diagnosis not present

## 2024-01-01 DIAGNOSIS — R918 Other nonspecific abnormal finding of lung field: Secondary | ICD-10-CM | POA: Diagnosis not present

## 2024-01-01 DIAGNOSIS — F4310 Post-traumatic stress disorder, unspecified: Secondary | ICD-10-CM | POA: Diagnosis not present

## 2024-01-01 DIAGNOSIS — I5022 Chronic systolic (congestive) heart failure: Secondary | ICD-10-CM | POA: Diagnosis not present

## 2024-01-01 DIAGNOSIS — N281 Cyst of kidney, acquired: Secondary | ICD-10-CM | POA: Diagnosis not present

## 2024-01-01 DIAGNOSIS — Z79899 Other long term (current) drug therapy: Secondary | ICD-10-CM | POA: Diagnosis not present

## 2024-01-01 DIAGNOSIS — I110 Hypertensive heart disease with heart failure: Secondary | ICD-10-CM | POA: Diagnosis not present

## 2024-01-01 DIAGNOSIS — E559 Vitamin D deficiency, unspecified: Secondary | ICD-10-CM | POA: Diagnosis not present

## 2024-01-01 DIAGNOSIS — Z6830 Body mass index (BMI) 30.0-30.9, adult: Secondary | ICD-10-CM | POA: Diagnosis not present

## 2024-01-01 DIAGNOSIS — J309 Allergic rhinitis, unspecified: Secondary | ICD-10-CM | POA: Diagnosis not present

## 2024-01-01 DIAGNOSIS — K219 Gastro-esophageal reflux disease without esophagitis: Secondary | ICD-10-CM | POA: Diagnosis not present

## 2024-01-01 DIAGNOSIS — Z9049 Acquired absence of other specified parts of digestive tract: Secondary | ICD-10-CM | POA: Diagnosis not present

## 2024-01-01 DIAGNOSIS — F410 Panic disorder [episodic paroxysmal anxiety] without agoraphobia: Secondary | ICD-10-CM | POA: Diagnosis not present

## 2024-01-04 DIAGNOSIS — R918 Other nonspecific abnormal finding of lung field: Secondary | ICD-10-CM | POA: Diagnosis not present

## 2024-01-04 DIAGNOSIS — G8929 Other chronic pain: Secondary | ICD-10-CM | POA: Diagnosis not present

## 2024-01-04 DIAGNOSIS — Z9049 Acquired absence of other specified parts of digestive tract: Secondary | ICD-10-CM | POA: Diagnosis not present

## 2024-01-04 DIAGNOSIS — E1140 Type 2 diabetes mellitus with diabetic neuropathy, unspecified: Secondary | ICD-10-CM | POA: Diagnosis not present

## 2024-01-04 DIAGNOSIS — Z5982 Transportation insecurity: Secondary | ICD-10-CM | POA: Diagnosis not present

## 2024-01-04 DIAGNOSIS — F4310 Post-traumatic stress disorder, unspecified: Secondary | ICD-10-CM | POA: Diagnosis not present

## 2024-01-04 DIAGNOSIS — F411 Generalized anxiety disorder: Secondary | ICD-10-CM | POA: Diagnosis not present

## 2024-01-04 DIAGNOSIS — L659 Nonscarring hair loss, unspecified: Secondary | ICD-10-CM | POA: Diagnosis not present

## 2024-01-04 DIAGNOSIS — F410 Panic disorder [episodic paroxysmal anxiety] without agoraphobia: Secondary | ICD-10-CM | POA: Diagnosis not present

## 2024-01-04 DIAGNOSIS — N281 Cyst of kidney, acquired: Secondary | ICD-10-CM | POA: Diagnosis not present

## 2024-01-04 DIAGNOSIS — F5104 Psychophysiologic insomnia: Secondary | ICD-10-CM | POA: Diagnosis not present

## 2024-01-04 DIAGNOSIS — F03B4 Unspecified dementia, moderate, with anxiety: Secondary | ICD-10-CM | POA: Diagnosis not present

## 2024-01-04 DIAGNOSIS — Z7984 Long term (current) use of oral hypoglycemic drugs: Secondary | ICD-10-CM | POA: Diagnosis not present

## 2024-01-04 DIAGNOSIS — Z79899 Other long term (current) drug therapy: Secondary | ICD-10-CM | POA: Diagnosis not present

## 2024-01-04 DIAGNOSIS — L409 Psoriasis, unspecified: Secondary | ICD-10-CM | POA: Diagnosis not present

## 2024-01-04 DIAGNOSIS — F329 Major depressive disorder, single episode, unspecified: Secondary | ICD-10-CM | POA: Diagnosis not present

## 2024-01-04 DIAGNOSIS — E669 Obesity, unspecified: Secondary | ICD-10-CM | POA: Diagnosis not present

## 2024-01-04 DIAGNOSIS — I110 Hypertensive heart disease with heart failure: Secondary | ICD-10-CM | POA: Diagnosis not present

## 2024-01-04 DIAGNOSIS — M1990 Unspecified osteoarthritis, unspecified site: Secondary | ICD-10-CM | POA: Diagnosis not present

## 2024-01-04 DIAGNOSIS — E559 Vitamin D deficiency, unspecified: Secondary | ICD-10-CM | POA: Diagnosis not present

## 2024-01-04 DIAGNOSIS — K219 Gastro-esophageal reflux disease without esophagitis: Secondary | ICD-10-CM | POA: Diagnosis not present

## 2024-01-04 DIAGNOSIS — L989 Disorder of the skin and subcutaneous tissue, unspecified: Secondary | ICD-10-CM | POA: Diagnosis not present

## 2024-01-04 DIAGNOSIS — F03B18 Unspecified dementia, moderate, with other behavioral disturbance: Secondary | ICD-10-CM | POA: Diagnosis not present

## 2024-01-04 DIAGNOSIS — K760 Fatty (change of) liver, not elsewhere classified: Secondary | ICD-10-CM | POA: Diagnosis not present

## 2024-01-04 DIAGNOSIS — F03B3 Unspecified dementia, moderate, with mood disturbance: Secondary | ICD-10-CM | POA: Diagnosis not present

## 2024-01-04 DIAGNOSIS — F339 Major depressive disorder, recurrent, unspecified: Secondary | ICD-10-CM | POA: Diagnosis not present

## 2024-01-04 DIAGNOSIS — I5022 Chronic systolic (congestive) heart failure: Secondary | ICD-10-CM | POA: Diagnosis not present

## 2024-01-04 DIAGNOSIS — M51369 Other intervertebral disc degeneration, lumbar region without mention of lumbar back pain or lower extremity pain: Secondary | ICD-10-CM | POA: Diagnosis not present

## 2024-01-04 DIAGNOSIS — J309 Allergic rhinitis, unspecified: Secondary | ICD-10-CM | POA: Diagnosis not present

## 2024-01-04 DIAGNOSIS — Z9089 Acquired absence of other organs: Secondary | ICD-10-CM | POA: Diagnosis not present

## 2024-01-04 DIAGNOSIS — Z6830 Body mass index (BMI) 30.0-30.9, adult: Secondary | ICD-10-CM | POA: Diagnosis not present

## 2024-01-05 DIAGNOSIS — D519 Vitamin B12 deficiency anemia, unspecified: Secondary | ICD-10-CM | POA: Diagnosis not present

## 2024-01-05 DIAGNOSIS — E782 Mixed hyperlipidemia: Secondary | ICD-10-CM | POA: Diagnosis not present

## 2024-01-05 DIAGNOSIS — Z79899 Other long term (current) drug therapy: Secondary | ICD-10-CM | POA: Diagnosis not present

## 2024-01-05 DIAGNOSIS — E119 Type 2 diabetes mellitus without complications: Secondary | ICD-10-CM | POA: Diagnosis not present

## 2024-01-06 DIAGNOSIS — L989 Disorder of the skin and subcutaneous tissue, unspecified: Secondary | ICD-10-CM | POA: Diagnosis not present

## 2024-01-06 DIAGNOSIS — I110 Hypertensive heart disease with heart failure: Secondary | ICD-10-CM | POA: Diagnosis not present

## 2024-01-06 DIAGNOSIS — Z6830 Body mass index (BMI) 30.0-30.9, adult: Secondary | ICD-10-CM | POA: Diagnosis not present

## 2024-01-06 DIAGNOSIS — F410 Panic disorder [episodic paroxysmal anxiety] without agoraphobia: Secondary | ICD-10-CM | POA: Diagnosis not present

## 2024-01-06 DIAGNOSIS — Z5982 Transportation insecurity: Secondary | ICD-10-CM | POA: Diagnosis not present

## 2024-01-06 DIAGNOSIS — E669 Obesity, unspecified: Secondary | ICD-10-CM | POA: Diagnosis not present

## 2024-01-06 DIAGNOSIS — Z7984 Long term (current) use of oral hypoglycemic drugs: Secondary | ICD-10-CM | POA: Diagnosis not present

## 2024-01-06 DIAGNOSIS — G8929 Other chronic pain: Secondary | ICD-10-CM | POA: Diagnosis not present

## 2024-01-06 DIAGNOSIS — Z9049 Acquired absence of other specified parts of digestive tract: Secondary | ICD-10-CM | POA: Diagnosis not present

## 2024-01-06 DIAGNOSIS — Z79899 Other long term (current) drug therapy: Secondary | ICD-10-CM | POA: Diagnosis not present

## 2024-01-06 DIAGNOSIS — J309 Allergic rhinitis, unspecified: Secondary | ICD-10-CM | POA: Diagnosis not present

## 2024-01-06 DIAGNOSIS — E1140 Type 2 diabetes mellitus with diabetic neuropathy, unspecified: Secondary | ICD-10-CM | POA: Diagnosis not present

## 2024-01-06 DIAGNOSIS — F4310 Post-traumatic stress disorder, unspecified: Secondary | ICD-10-CM | POA: Diagnosis not present

## 2024-01-06 DIAGNOSIS — F329 Major depressive disorder, single episode, unspecified: Secondary | ICD-10-CM | POA: Diagnosis not present

## 2024-01-06 DIAGNOSIS — L409 Psoriasis, unspecified: Secondary | ICD-10-CM | POA: Diagnosis not present

## 2024-01-06 DIAGNOSIS — N281 Cyst of kidney, acquired: Secondary | ICD-10-CM | POA: Diagnosis not present

## 2024-01-06 DIAGNOSIS — E559 Vitamin D deficiency, unspecified: Secondary | ICD-10-CM | POA: Diagnosis not present

## 2024-01-06 DIAGNOSIS — M51369 Other intervertebral disc degeneration, lumbar region without mention of lumbar back pain or lower extremity pain: Secondary | ICD-10-CM | POA: Diagnosis not present

## 2024-01-06 DIAGNOSIS — L659 Nonscarring hair loss, unspecified: Secondary | ICD-10-CM | POA: Diagnosis not present

## 2024-01-06 DIAGNOSIS — K219 Gastro-esophageal reflux disease without esophagitis: Secondary | ICD-10-CM | POA: Diagnosis not present

## 2024-01-06 DIAGNOSIS — K760 Fatty (change of) liver, not elsewhere classified: Secondary | ICD-10-CM | POA: Diagnosis not present

## 2024-01-06 DIAGNOSIS — M1990 Unspecified osteoarthritis, unspecified site: Secondary | ICD-10-CM | POA: Diagnosis not present

## 2024-01-06 DIAGNOSIS — I5022 Chronic systolic (congestive) heart failure: Secondary | ICD-10-CM | POA: Diagnosis not present

## 2024-01-06 DIAGNOSIS — Z9089 Acquired absence of other organs: Secondary | ICD-10-CM | POA: Diagnosis not present

## 2024-01-06 DIAGNOSIS — R918 Other nonspecific abnormal finding of lung field: Secondary | ICD-10-CM | POA: Diagnosis not present

## 2024-01-08 DIAGNOSIS — I10 Essential (primary) hypertension: Secondary | ICD-10-CM | POA: Diagnosis not present

## 2024-01-08 DIAGNOSIS — I509 Heart failure, unspecified: Secondary | ICD-10-CM | POA: Diagnosis not present

## 2024-01-08 DIAGNOSIS — E038 Other specified hypothyroidism: Secondary | ICD-10-CM | POA: Diagnosis not present

## 2024-01-08 DIAGNOSIS — E559 Vitamin D deficiency, unspecified: Secondary | ICD-10-CM | POA: Diagnosis not present

## 2024-01-08 DIAGNOSIS — E119 Type 2 diabetes mellitus without complications: Secondary | ICD-10-CM | POA: Diagnosis not present

## 2024-01-08 DIAGNOSIS — I70223 Atherosclerosis of native arteries of extremities with rest pain, bilateral legs: Secondary | ICD-10-CM | POA: Diagnosis not present

## 2024-01-08 DIAGNOSIS — D519 Vitamin B12 deficiency anemia, unspecified: Secondary | ICD-10-CM | POA: Diagnosis not present

## 2024-01-08 DIAGNOSIS — E782 Mixed hyperlipidemia: Secondary | ICD-10-CM | POA: Diagnosis not present

## 2024-01-15 DIAGNOSIS — K760 Fatty (change of) liver, not elsewhere classified: Secondary | ICD-10-CM | POA: Diagnosis not present

## 2024-01-15 DIAGNOSIS — F329 Major depressive disorder, single episode, unspecified: Secondary | ICD-10-CM | POA: Diagnosis not present

## 2024-01-15 DIAGNOSIS — E1140 Type 2 diabetes mellitus with diabetic neuropathy, unspecified: Secondary | ICD-10-CM | POA: Diagnosis not present

## 2024-01-15 DIAGNOSIS — L409 Psoriasis, unspecified: Secondary | ICD-10-CM | POA: Diagnosis not present

## 2024-01-15 DIAGNOSIS — Z9089 Acquired absence of other organs: Secondary | ICD-10-CM | POA: Diagnosis not present

## 2024-01-15 DIAGNOSIS — L659 Nonscarring hair loss, unspecified: Secondary | ICD-10-CM | POA: Diagnosis not present

## 2024-01-15 DIAGNOSIS — R918 Other nonspecific abnormal finding of lung field: Secondary | ICD-10-CM | POA: Diagnosis not present

## 2024-01-15 DIAGNOSIS — Z9049 Acquired absence of other specified parts of digestive tract: Secondary | ICD-10-CM | POA: Diagnosis not present

## 2024-01-15 DIAGNOSIS — N281 Cyst of kidney, acquired: Secondary | ICD-10-CM | POA: Diagnosis not present

## 2024-01-15 DIAGNOSIS — F4310 Post-traumatic stress disorder, unspecified: Secondary | ICD-10-CM | POA: Diagnosis not present

## 2024-01-15 DIAGNOSIS — J309 Allergic rhinitis, unspecified: Secondary | ICD-10-CM | POA: Diagnosis not present

## 2024-01-15 DIAGNOSIS — F410 Panic disorder [episodic paroxysmal anxiety] without agoraphobia: Secondary | ICD-10-CM | POA: Diagnosis not present

## 2024-01-15 DIAGNOSIS — Z6830 Body mass index (BMI) 30.0-30.9, adult: Secondary | ICD-10-CM | POA: Diagnosis not present

## 2024-01-15 DIAGNOSIS — M51369 Other intervertebral disc degeneration, lumbar region without mention of lumbar back pain or lower extremity pain: Secondary | ICD-10-CM | POA: Diagnosis not present

## 2024-01-15 DIAGNOSIS — I110 Hypertensive heart disease with heart failure: Secondary | ICD-10-CM | POA: Diagnosis not present

## 2024-01-15 DIAGNOSIS — Z79899 Other long term (current) drug therapy: Secondary | ICD-10-CM | POA: Diagnosis not present

## 2024-01-15 DIAGNOSIS — L989 Disorder of the skin and subcutaneous tissue, unspecified: Secondary | ICD-10-CM | POA: Diagnosis not present

## 2024-01-15 DIAGNOSIS — I5022 Chronic systolic (congestive) heart failure: Secondary | ICD-10-CM | POA: Diagnosis not present

## 2024-01-15 DIAGNOSIS — G8929 Other chronic pain: Secondary | ICD-10-CM | POA: Diagnosis not present

## 2024-01-15 DIAGNOSIS — Z5982 Transportation insecurity: Secondary | ICD-10-CM | POA: Diagnosis not present

## 2024-01-15 DIAGNOSIS — Z7984 Long term (current) use of oral hypoglycemic drugs: Secondary | ICD-10-CM | POA: Diagnosis not present

## 2024-01-15 DIAGNOSIS — K219 Gastro-esophageal reflux disease without esophagitis: Secondary | ICD-10-CM | POA: Diagnosis not present

## 2024-01-15 DIAGNOSIS — M1990 Unspecified osteoarthritis, unspecified site: Secondary | ICD-10-CM | POA: Diagnosis not present

## 2024-01-15 DIAGNOSIS — E559 Vitamin D deficiency, unspecified: Secondary | ICD-10-CM | POA: Diagnosis not present

## 2024-01-15 DIAGNOSIS — E669 Obesity, unspecified: Secondary | ICD-10-CM | POA: Diagnosis not present

## 2024-01-19 DIAGNOSIS — Z5982 Transportation insecurity: Secondary | ICD-10-CM | POA: Diagnosis not present

## 2024-01-19 DIAGNOSIS — I110 Hypertensive heart disease with heart failure: Secondary | ICD-10-CM | POA: Diagnosis not present

## 2024-01-19 DIAGNOSIS — Z7984 Long term (current) use of oral hypoglycemic drugs: Secondary | ICD-10-CM | POA: Diagnosis not present

## 2024-01-19 DIAGNOSIS — F4310 Post-traumatic stress disorder, unspecified: Secondary | ICD-10-CM | POA: Diagnosis not present

## 2024-01-19 DIAGNOSIS — F329 Major depressive disorder, single episode, unspecified: Secondary | ICD-10-CM | POA: Diagnosis not present

## 2024-01-19 DIAGNOSIS — I5022 Chronic systolic (congestive) heart failure: Secondary | ICD-10-CM | POA: Diagnosis not present

## 2024-01-19 DIAGNOSIS — Z6830 Body mass index (BMI) 30.0-30.9, adult: Secondary | ICD-10-CM | POA: Diagnosis not present

## 2024-01-19 DIAGNOSIS — M51369 Other intervertebral disc degeneration, lumbar region without mention of lumbar back pain or lower extremity pain: Secondary | ICD-10-CM | POA: Diagnosis not present

## 2024-01-19 DIAGNOSIS — M1990 Unspecified osteoarthritis, unspecified site: Secondary | ICD-10-CM | POA: Diagnosis not present

## 2024-01-19 DIAGNOSIS — L409 Psoriasis, unspecified: Secondary | ICD-10-CM | POA: Diagnosis not present

## 2024-01-19 DIAGNOSIS — K760 Fatty (change of) liver, not elsewhere classified: Secondary | ICD-10-CM | POA: Diagnosis not present

## 2024-01-19 DIAGNOSIS — L659 Nonscarring hair loss, unspecified: Secondary | ICD-10-CM | POA: Diagnosis not present

## 2024-01-19 DIAGNOSIS — R918 Other nonspecific abnormal finding of lung field: Secondary | ICD-10-CM | POA: Diagnosis not present

## 2024-01-19 DIAGNOSIS — E1140 Type 2 diabetes mellitus with diabetic neuropathy, unspecified: Secondary | ICD-10-CM | POA: Diagnosis not present

## 2024-01-19 DIAGNOSIS — E669 Obesity, unspecified: Secondary | ICD-10-CM | POA: Diagnosis not present

## 2024-01-19 DIAGNOSIS — F410 Panic disorder [episodic paroxysmal anxiety] without agoraphobia: Secondary | ICD-10-CM | POA: Diagnosis not present

## 2024-01-19 DIAGNOSIS — N281 Cyst of kidney, acquired: Secondary | ICD-10-CM | POA: Diagnosis not present

## 2024-01-19 DIAGNOSIS — Z9049 Acquired absence of other specified parts of digestive tract: Secondary | ICD-10-CM | POA: Diagnosis not present

## 2024-01-19 DIAGNOSIS — Z79899 Other long term (current) drug therapy: Secondary | ICD-10-CM | POA: Diagnosis not present

## 2024-01-19 DIAGNOSIS — Z9089 Acquired absence of other organs: Secondary | ICD-10-CM | POA: Diagnosis not present

## 2024-01-19 DIAGNOSIS — J309 Allergic rhinitis, unspecified: Secondary | ICD-10-CM | POA: Diagnosis not present

## 2024-01-19 DIAGNOSIS — G8929 Other chronic pain: Secondary | ICD-10-CM | POA: Diagnosis not present

## 2024-01-19 DIAGNOSIS — L989 Disorder of the skin and subcutaneous tissue, unspecified: Secondary | ICD-10-CM | POA: Diagnosis not present

## 2024-01-19 DIAGNOSIS — E559 Vitamin D deficiency, unspecified: Secondary | ICD-10-CM | POA: Diagnosis not present

## 2024-01-19 DIAGNOSIS — K219 Gastro-esophageal reflux disease without esophagitis: Secondary | ICD-10-CM | POA: Diagnosis not present

## 2024-01-22 IMAGING — DX DG KNEE COMPLETE 4+V*L*
4 series · 4 of 4 positions shown · non-contrast
Comparison: None.

CLINICAL DATA: medial joint pain/catcing

EXAM:
LEFT KNEE - COMPLETE 4+ VIEW

[knee ap]
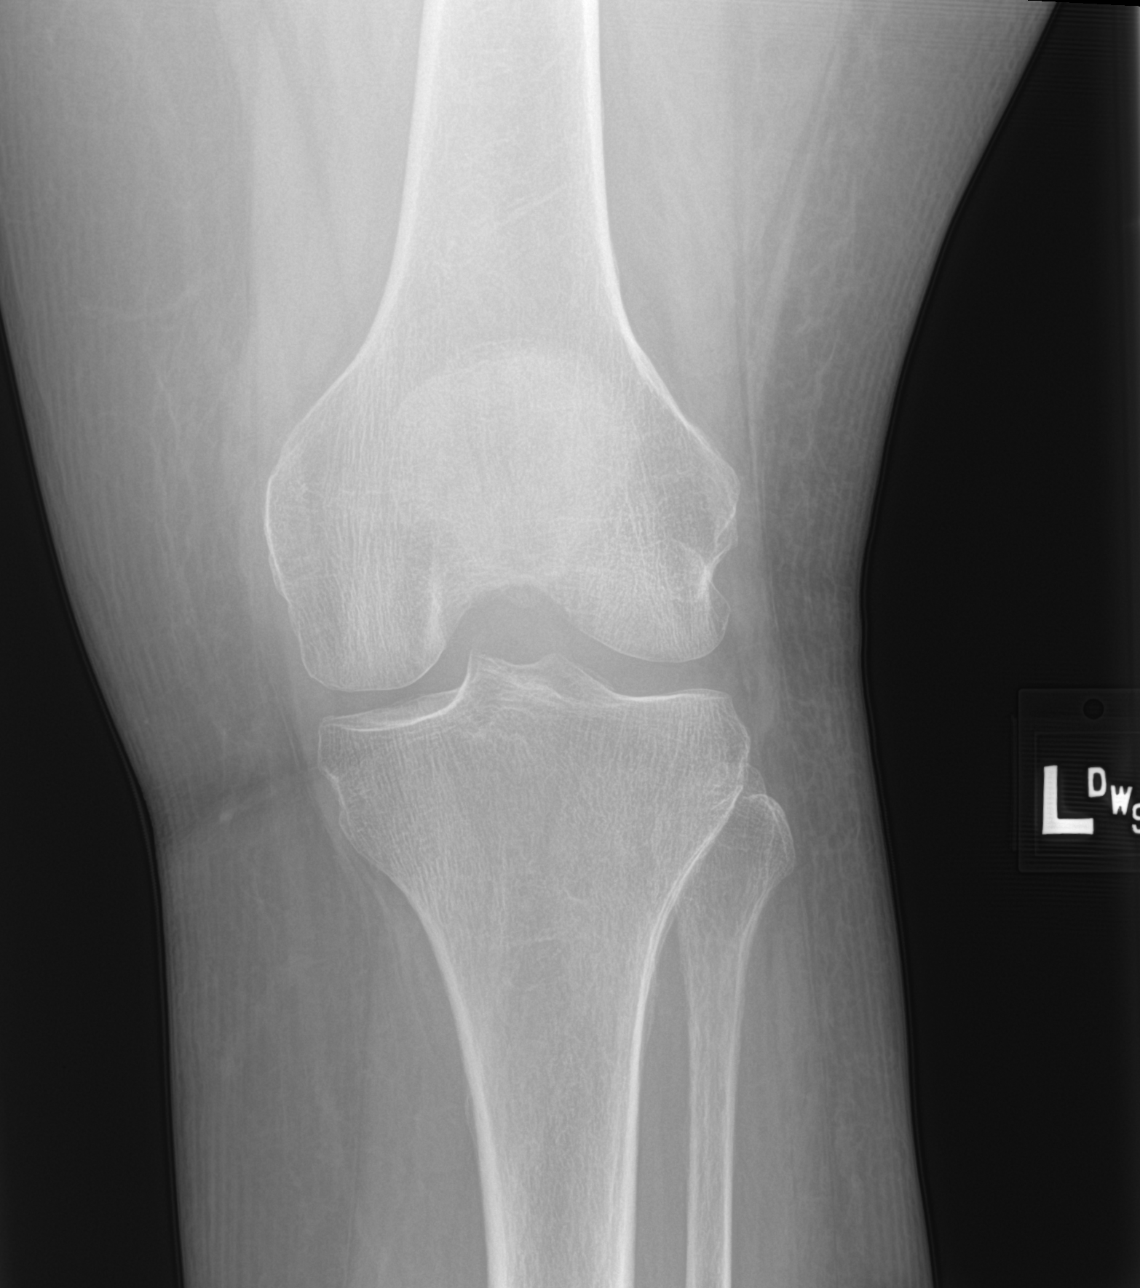

[knee lat]
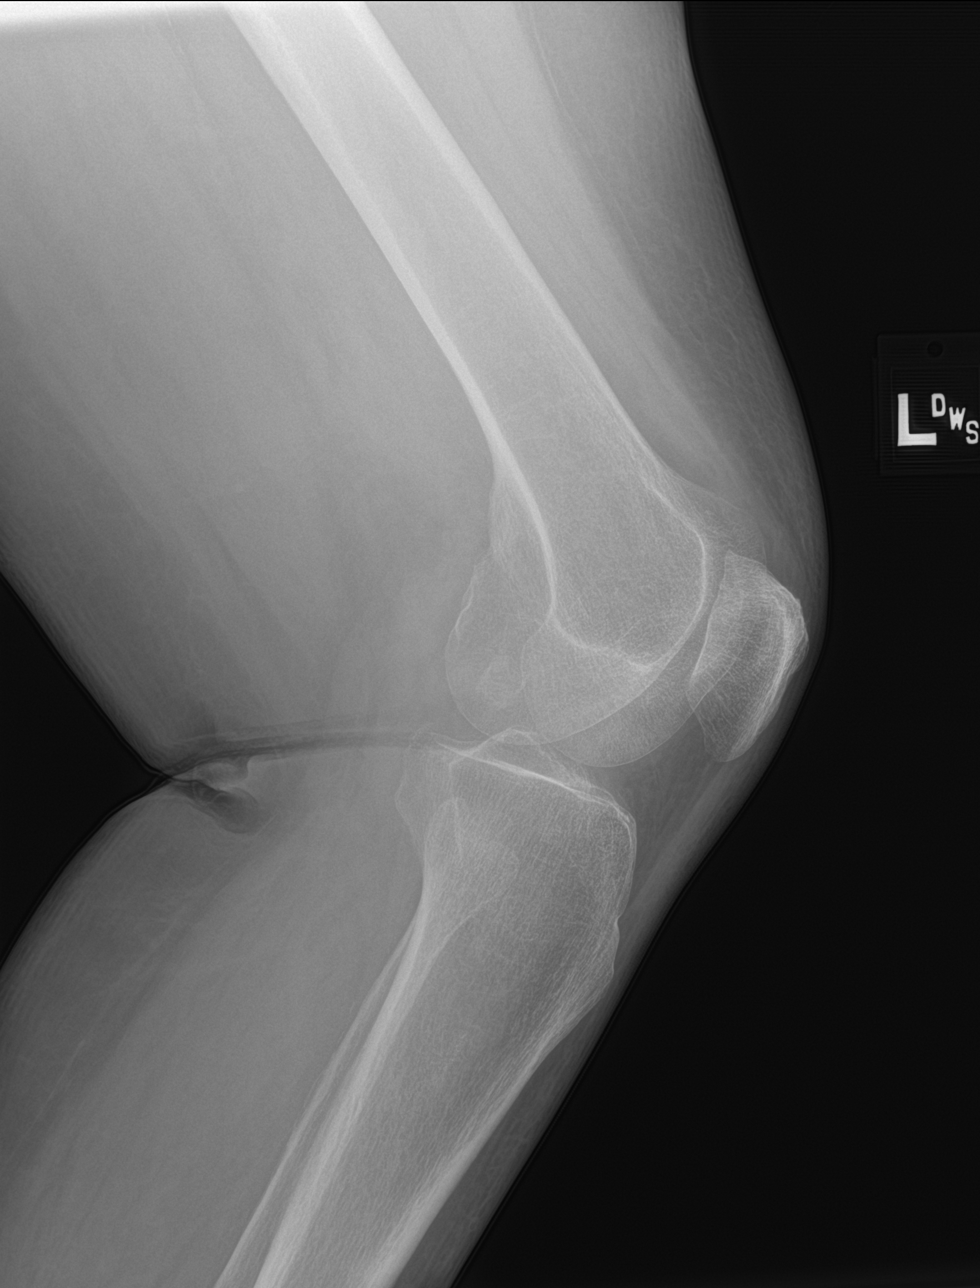

[knee obl (1 of 2)]
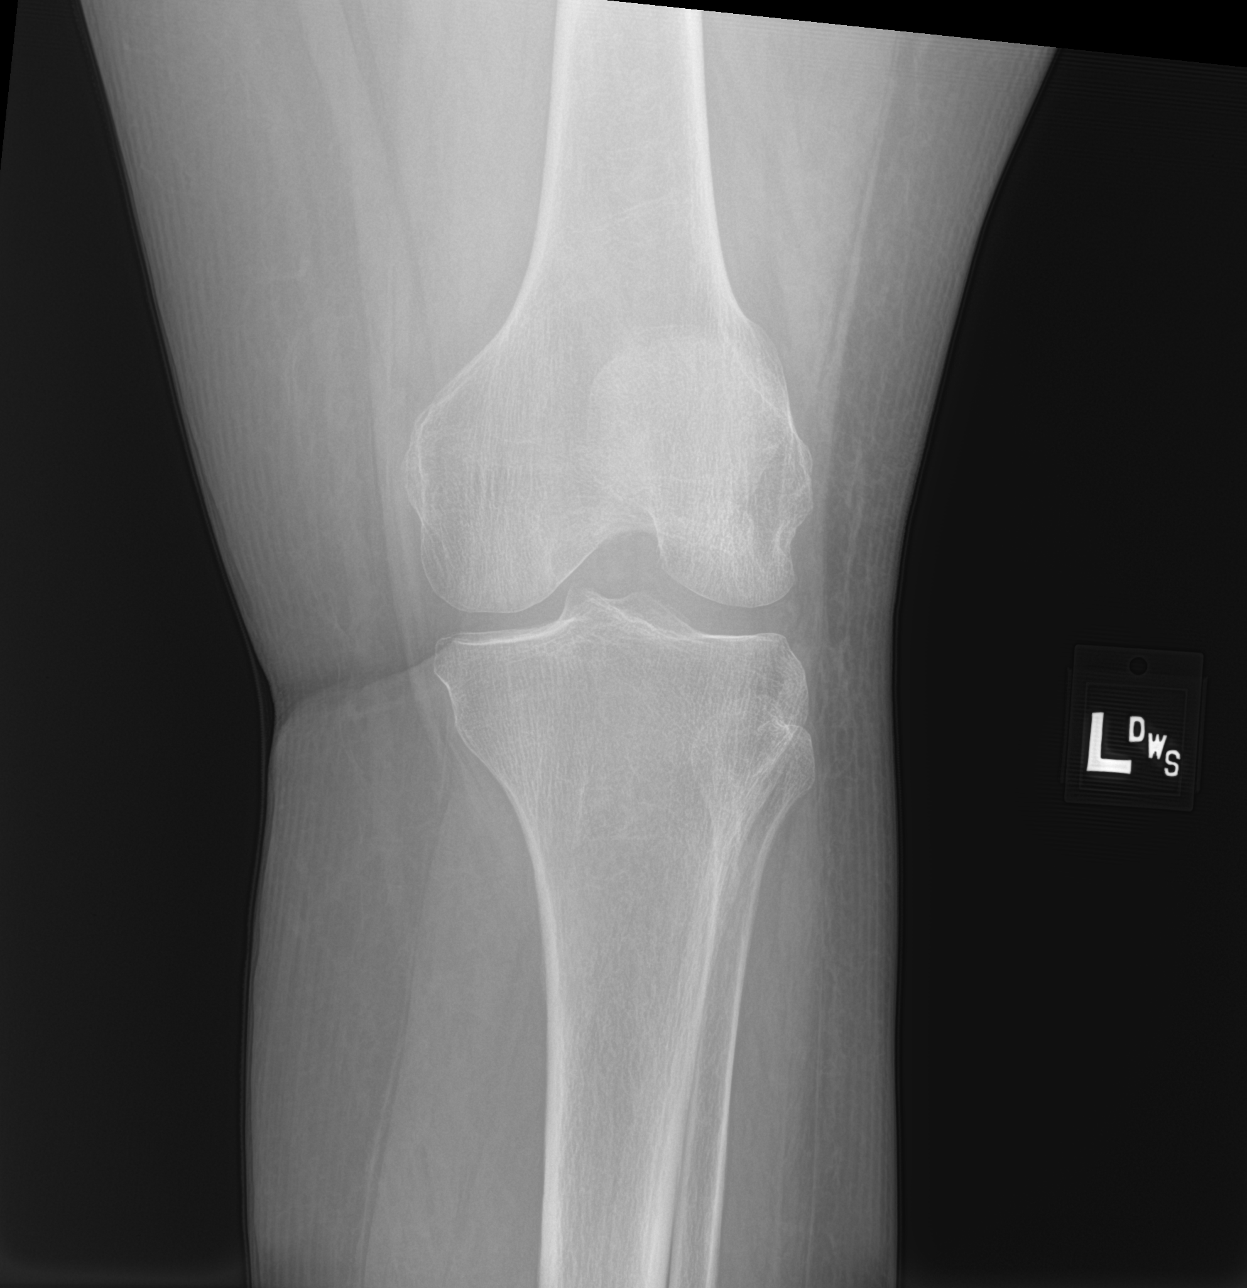

[knee obl (2 of 2)]
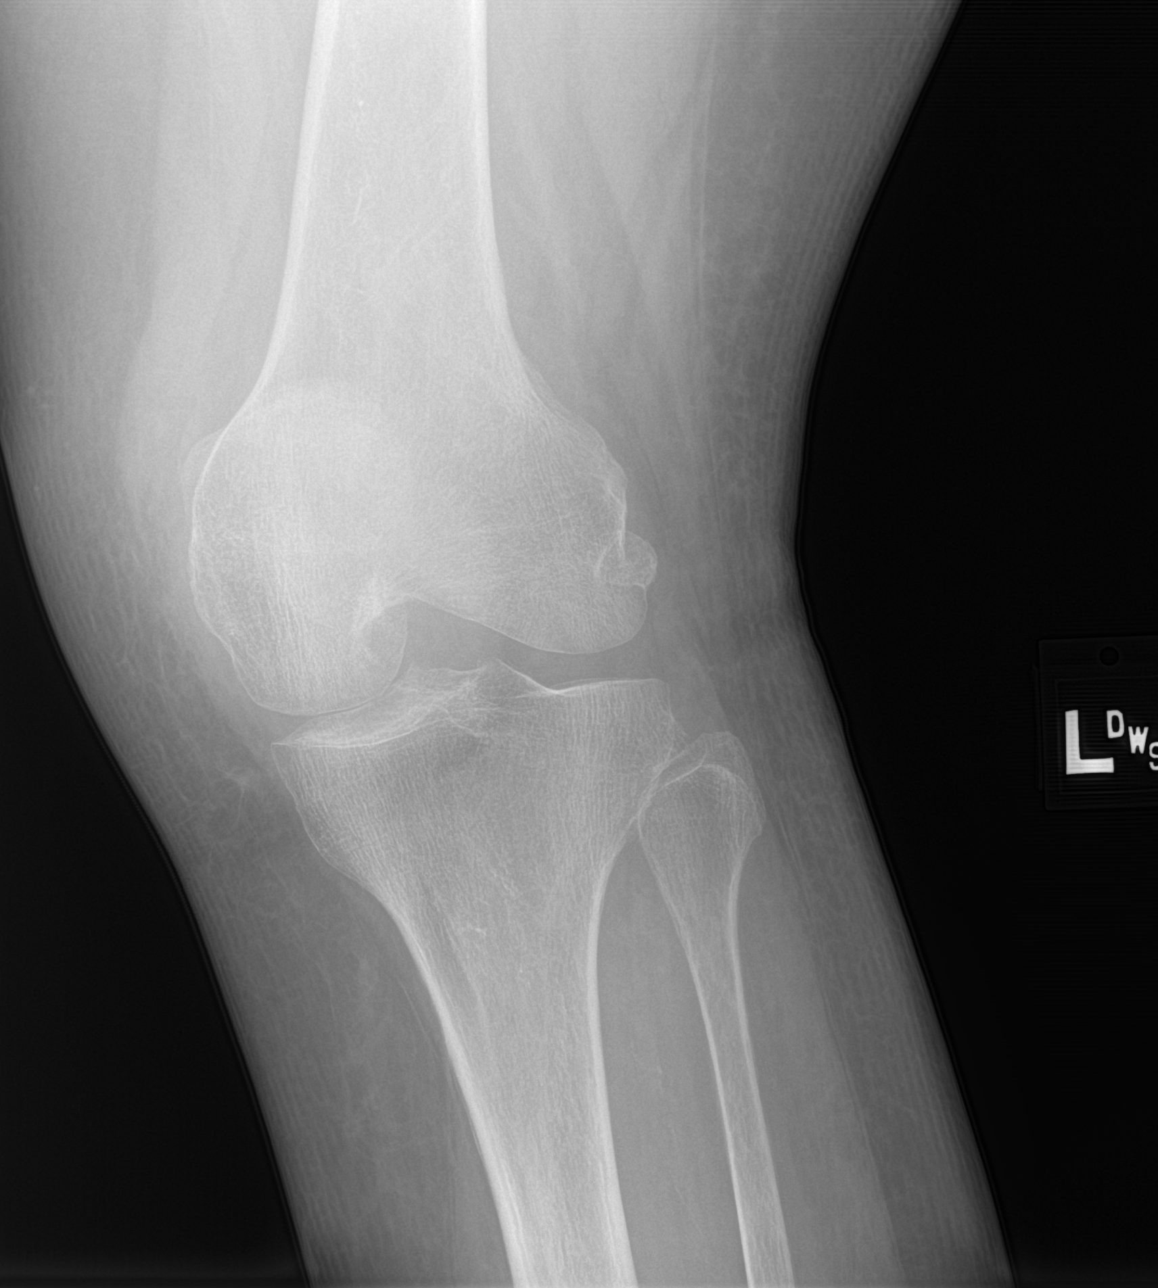

[4 of 4 positions shown; findings below may reference images not displayed]

FINDINGS: Osteopenia. No acute fracture or dislocation. Joint spaces and
alignment are maintained. No area of erosion or osseous destruction.
No unexpected radiopaque foreign body. Soft tissues are
unremarkable.
IMPRESSION: No acute fracture or dislocation.

## 2024-01-25 DIAGNOSIS — B179 Acute viral hepatitis, unspecified: Secondary | ICD-10-CM | POA: Diagnosis not present

## 2024-01-25 DIAGNOSIS — Z79899 Other long term (current) drug therapy: Secondary | ICD-10-CM | POA: Diagnosis not present

## 2024-01-25 DIAGNOSIS — D519 Vitamin B12 deficiency anemia, unspecified: Secondary | ICD-10-CM | POA: Diagnosis not present

## 2024-01-25 DIAGNOSIS — E119 Type 2 diabetes mellitus without complications: Secondary | ICD-10-CM | POA: Diagnosis not present

## 2024-01-25 DIAGNOSIS — E782 Mixed hyperlipidemia: Secondary | ICD-10-CM | POA: Diagnosis not present

## 2024-01-26 DIAGNOSIS — Z5982 Transportation insecurity: Secondary | ICD-10-CM | POA: Diagnosis not present

## 2024-01-26 DIAGNOSIS — M51369 Other intervertebral disc degeneration, lumbar region without mention of lumbar back pain or lower extremity pain: Secondary | ICD-10-CM | POA: Diagnosis not present

## 2024-01-26 DIAGNOSIS — F419 Anxiety disorder, unspecified: Secondary | ICD-10-CM | POA: Diagnosis not present

## 2024-01-26 DIAGNOSIS — E669 Obesity, unspecified: Secondary | ICD-10-CM | POA: Diagnosis not present

## 2024-01-26 DIAGNOSIS — F329 Major depressive disorder, single episode, unspecified: Secondary | ICD-10-CM | POA: Diagnosis not present

## 2024-01-26 DIAGNOSIS — R918 Other nonspecific abnormal finding of lung field: Secondary | ICD-10-CM | POA: Diagnosis not present

## 2024-01-26 DIAGNOSIS — G629 Polyneuropathy, unspecified: Secondary | ICD-10-CM | POA: Diagnosis not present

## 2024-01-26 DIAGNOSIS — Z79899 Other long term (current) drug therapy: Secondary | ICD-10-CM | POA: Diagnosis not present

## 2024-01-26 DIAGNOSIS — E1140 Type 2 diabetes mellitus with diabetic neuropathy, unspecified: Secondary | ICD-10-CM | POA: Diagnosis not present

## 2024-01-26 DIAGNOSIS — F4310 Post-traumatic stress disorder, unspecified: Secondary | ICD-10-CM | POA: Diagnosis not present

## 2024-01-26 DIAGNOSIS — L659 Nonscarring hair loss, unspecified: Secondary | ICD-10-CM | POA: Diagnosis not present

## 2024-01-26 DIAGNOSIS — M1990 Unspecified osteoarthritis, unspecified site: Secondary | ICD-10-CM | POA: Diagnosis not present

## 2024-01-26 DIAGNOSIS — Z9049 Acquired absence of other specified parts of digestive tract: Secondary | ICD-10-CM | POA: Diagnosis not present

## 2024-01-26 DIAGNOSIS — I5022 Chronic systolic (congestive) heart failure: Secondary | ICD-10-CM | POA: Diagnosis not present

## 2024-01-26 DIAGNOSIS — Z7984 Long term (current) use of oral hypoglycemic drugs: Secondary | ICD-10-CM | POA: Diagnosis not present

## 2024-01-26 DIAGNOSIS — F410 Panic disorder [episodic paroxysmal anxiety] without agoraphobia: Secondary | ICD-10-CM | POA: Diagnosis not present

## 2024-01-26 DIAGNOSIS — J309 Allergic rhinitis, unspecified: Secondary | ICD-10-CM | POA: Diagnosis not present

## 2024-01-26 DIAGNOSIS — L409 Psoriasis, unspecified: Secondary | ICD-10-CM | POA: Diagnosis not present

## 2024-01-26 DIAGNOSIS — E119 Type 2 diabetes mellitus without complications: Secondary | ICD-10-CM | POA: Diagnosis not present

## 2024-01-26 DIAGNOSIS — G8929 Other chronic pain: Secondary | ICD-10-CM | POA: Diagnosis not present

## 2024-01-26 DIAGNOSIS — K760 Fatty (change of) liver, not elsewhere classified: Secondary | ICD-10-CM | POA: Diagnosis not present

## 2024-01-26 DIAGNOSIS — K219 Gastro-esophageal reflux disease without esophagitis: Secondary | ICD-10-CM | POA: Diagnosis not present

## 2024-01-26 DIAGNOSIS — Z6830 Body mass index (BMI) 30.0-30.9, adult: Secondary | ICD-10-CM | POA: Diagnosis not present

## 2024-01-26 DIAGNOSIS — Z9089 Acquired absence of other organs: Secondary | ICD-10-CM | POA: Diagnosis not present

## 2024-01-26 DIAGNOSIS — E559 Vitamin D deficiency, unspecified: Secondary | ICD-10-CM | POA: Diagnosis not present

## 2024-01-26 DIAGNOSIS — N281 Cyst of kidney, acquired: Secondary | ICD-10-CM | POA: Diagnosis not present

## 2024-01-26 DIAGNOSIS — L989 Disorder of the skin and subcutaneous tissue, unspecified: Secondary | ICD-10-CM | POA: Diagnosis not present

## 2024-01-26 DIAGNOSIS — I110 Hypertensive heart disease with heart failure: Secondary | ICD-10-CM | POA: Diagnosis not present

## 2024-01-28 DIAGNOSIS — F4310 Post-traumatic stress disorder, unspecified: Secondary | ICD-10-CM | POA: Diagnosis not present

## 2024-01-28 DIAGNOSIS — R918 Other nonspecific abnormal finding of lung field: Secondary | ICD-10-CM | POA: Diagnosis not present

## 2024-01-28 DIAGNOSIS — M51369 Other intervertebral disc degeneration, lumbar region without mention of lumbar back pain or lower extremity pain: Secondary | ICD-10-CM | POA: Diagnosis not present

## 2024-01-28 DIAGNOSIS — N281 Cyst of kidney, acquired: Secondary | ICD-10-CM | POA: Diagnosis not present

## 2024-01-28 DIAGNOSIS — E1140 Type 2 diabetes mellitus with diabetic neuropathy, unspecified: Secondary | ICD-10-CM | POA: Diagnosis not present

## 2024-01-28 DIAGNOSIS — K760 Fatty (change of) liver, not elsewhere classified: Secondary | ICD-10-CM | POA: Diagnosis not present

## 2024-01-28 DIAGNOSIS — L659 Nonscarring hair loss, unspecified: Secondary | ICD-10-CM | POA: Diagnosis not present

## 2024-01-28 DIAGNOSIS — J309 Allergic rhinitis, unspecified: Secondary | ICD-10-CM | POA: Diagnosis not present

## 2024-01-28 DIAGNOSIS — K219 Gastro-esophageal reflux disease without esophagitis: Secondary | ICD-10-CM | POA: Diagnosis not present

## 2024-01-28 DIAGNOSIS — E559 Vitamin D deficiency, unspecified: Secondary | ICD-10-CM | POA: Diagnosis not present

## 2024-01-28 DIAGNOSIS — G8929 Other chronic pain: Secondary | ICD-10-CM | POA: Diagnosis not present

## 2024-01-28 DIAGNOSIS — F410 Panic disorder [episodic paroxysmal anxiety] without agoraphobia: Secondary | ICD-10-CM | POA: Diagnosis not present

## 2024-01-28 DIAGNOSIS — Z9089 Acquired absence of other organs: Secondary | ICD-10-CM | POA: Diagnosis not present

## 2024-01-28 DIAGNOSIS — L409 Psoriasis, unspecified: Secondary | ICD-10-CM | POA: Diagnosis not present

## 2024-01-28 DIAGNOSIS — I110 Hypertensive heart disease with heart failure: Secondary | ICD-10-CM | POA: Diagnosis not present

## 2024-01-28 DIAGNOSIS — I5022 Chronic systolic (congestive) heart failure: Secondary | ICD-10-CM | POA: Diagnosis not present

## 2024-01-28 DIAGNOSIS — E669 Obesity, unspecified: Secondary | ICD-10-CM | POA: Diagnosis not present

## 2024-01-28 DIAGNOSIS — Z5982 Transportation insecurity: Secondary | ICD-10-CM | POA: Diagnosis not present

## 2024-01-28 DIAGNOSIS — M1990 Unspecified osteoarthritis, unspecified site: Secondary | ICD-10-CM | POA: Diagnosis not present

## 2024-01-28 DIAGNOSIS — Z79899 Other long term (current) drug therapy: Secondary | ICD-10-CM | POA: Diagnosis not present

## 2024-01-28 DIAGNOSIS — Z9049 Acquired absence of other specified parts of digestive tract: Secondary | ICD-10-CM | POA: Diagnosis not present

## 2024-01-28 DIAGNOSIS — Z6830 Body mass index (BMI) 30.0-30.9, adult: Secondary | ICD-10-CM | POA: Diagnosis not present

## 2024-01-28 DIAGNOSIS — L989 Disorder of the skin and subcutaneous tissue, unspecified: Secondary | ICD-10-CM | POA: Diagnosis not present

## 2024-01-28 DIAGNOSIS — F329 Major depressive disorder, single episode, unspecified: Secondary | ICD-10-CM | POA: Diagnosis not present

## 2024-01-28 DIAGNOSIS — Z7984 Long term (current) use of oral hypoglycemic drugs: Secondary | ICD-10-CM | POA: Diagnosis not present

## 2024-02-23 ENCOUNTER — Other Ambulatory Visit: Payer: Self-pay

## 2024-02-23 ENCOUNTER — Emergency Department
Admission: EM | Admit: 2024-02-23 | Discharge: 2024-02-23 | Disposition: A | Discharge status: Home / Self Care | Attending: Emergency Medicine | Admitting: Emergency Medicine

## 2024-02-23 DIAGNOSIS — I509 Heart failure, unspecified: Secondary | ICD-10-CM | POA: Insufficient documentation

## 2024-02-23 DIAGNOSIS — K529 Noninfective gastroenteritis and colitis, unspecified: Secondary | ICD-10-CM | POA: Insufficient documentation

## 2024-02-23 DIAGNOSIS — I110 Hypertensive heart disease with heart failure: Secondary | ICD-10-CM | POA: Insufficient documentation

## 2024-02-23 DIAGNOSIS — M791 Myalgia, unspecified site: Secondary | ICD-10-CM | POA: Insufficient documentation

## 2024-02-23 LAB — COMPREHENSIVE METABOLIC PANEL WITH GFR
ALT: 15 U/L (ref 0–44)
AST: 20 U/L (ref 15–41)
Albumin: 4.1 g/dL (ref 3.5–5.0)
Alkaline Phosphatase: 91 U/L (ref 38–126)
Anion gap: 11 (ref 5–15)
BUN: 22 mg/dL (ref 8–23)
CO2: 24 mmol/L (ref 22–32)
Calcium: 9.1 mg/dL (ref 8.9–10.3)
Chloride: 103 mmol/L (ref 98–111)
Creatinine, Ser: 0.74 mg/dL (ref 0.44–1.00)
GFR, Estimated: >60 mL/min
Glucose, Bld: 165 mg/dL — ABNORMAL HIGH (ref 70–99)
Potassium: 4.5 mmol/L (ref 3.5–5.1)
Sodium: 138 mmol/L (ref 135–145)
Total Bilirubin: 0.4 mg/dL (ref 0.0–1.2)
Total Protein: 6.8 g/dL (ref 6.5–8.1)

## 2024-02-23 LAB — CBC
HCT: 41.4 % (ref 36.0–46.0)
Hemoglobin: 13.6 g/dL (ref 12.0–15.0)
MCH: 31.3 pg (ref 26.0–34.0)
MCHC: 32.9 g/dL (ref 30.0–36.0)
MCV: 95.2 fL (ref 80.0–100.0)
Platelets: 263 K/uL (ref 150–400)
RBC: 4.35 MIL/uL (ref 3.87–5.11)
RDW: 12.3 % (ref 11.5–15.5)
WBC: 17.5 K/uL — ABNORMAL HIGH (ref 4.0–10.5)
nRBC: 0 % (ref 0.0–0.2)

## 2024-02-23 LAB — RESP PANEL BY RT-PCR (RSV, FLU A&B, COVID)  RVPGX2
Influenza A by PCR: NEGATIVE
Influenza B by PCR: NEGATIVE
Resp Syncytial Virus by PCR: NEGATIVE
SARS Coronavirus 2 by RT PCR: NEGATIVE

## 2024-02-23 LAB — LIPASE, BLOOD: Lipase: 15 U/L (ref 11–51)

## 2024-02-23 MED ORDER — ONDANSETRON 4 MG PO TBDP: 4.0000 mg | ORAL_TABLET | Freq: Three times a day (TID) | ORAL | 0 refills | Status: AC | PRN

## 2024-02-23 MED ORDER — SODIUM CHLORIDE 0.9 % IV BOLUS
500.0000 mL | Freq: Once | INTRAVENOUS | Status: AC
  Administered 2024-02-23: 500 mL via INTRAVENOUS

## 2024-02-23 MED ORDER — ONDANSETRON HCL 4 MG/2ML IJ SOLN
4.0000 mg | Freq: Once | INTRAMUSCULAR | Status: AC
  Administered 2024-02-23: 4 mg via INTRAVENOUS
  Filled 2024-02-23: qty 2

## 2024-02-23 NOTE — ED Provider Notes (Signed)
 "  [facility] Provider Note    Event Date/Time   First MD Initiated Contact with Patient 02/23/24 0831     (approximate)   History   Diarrhea   HPI  Dawn [name] is a 84 y.o. female with history of CHF, hypertension, rheumatoid arthritis who presents with complaints of nausea vomiting diarrhea body aches chills that started overnight.  She is concerned that she may have the flu.  Is tolerating some liquids     Physical Exam   Triage Vital Signs: ED Triage Vitals  Encounter Vitals Group     BP 02/23/24 0812 136/78     Girls Systolic BP Percentile --      Girls Diastolic BP Percentile --      Boys Systolic BP Percentile --      Boys Diastolic BP Percentile --      Pulse Rate 02/23/24 0812 (!) 105     Resp 02/23/24 0812 16     Temp 02/23/24 0812 98.3 F (36.8 C)     Temp Source 02/23/24 0812 Oral     SpO2 02/23/24 0812 95 %     Weight 02/23/24 0811 81.6 kg (179 lb 14.3 oz)     Height 02/23/24 0811 1.626 m (5' 4)     Head Circumference --      Peak Flow --      Pain Score 02/23/24 0759 6     Pain Loc --      Pain Education --      Exclude from Growth Chart --     Most recent vital signs: Vitals:   02/23/24 0812  BP: 136/78  Pulse: (!) 105  Resp: 16  Temp: 98.3 F (36.8 C)  SpO2: 95%     General: Awake, no distress.  CV:  Good peripheral perfusion.  Mild tachycardia  Resp:  Normal effort.  Abd:  No distention.  Soft, nontender, reassuring exam Other:     ED Results / Procedures / Treatments   Labs (all labs ordered are listed, but only abnormal results are displayed) Labs Reviewed  COMPREHENSIVE METABOLIC PANEL WITH GFR - Abnormal; Notable for the following components:      Result Value   Glucose, Bld 165 (*)    All other components within normal limits  CBC - Abnormal; Notable for the following components:   WBC 17.5 (*)    All other components within normal limits  RESP PANEL BY RT-PCR (RSV, FLU A&B, COVID)  [id number]   LIPASE, BLOOD     EKG     RADIOLOGY     PROCEDURES:  Critical Care performed:   Procedures   MEDICATIONS ORDERED IN ED: Medications  sodium chloride  0.9 % bolus 500 mL (500 mLs Intravenous New Bag/Given 02/23/24 0854)  ondansetron  (ZOFRAN ) injection 4 mg (4 mg Intravenous Given 02/23/24 0854)     IMPRESSION / MDM / ASSESSMENT AND PLAN / ED COURSE  I reviewed the triage vital signs and the nursing notes. Patient's presentation is most consistent with acute presentation with potential threat to life or bodily function.  Patient presents with nausea vomiting diarrhea as detailed above, differential includes dehydration, norovirus, influenza  Abdominal exam is benign  Lab work demonstrates elevated white blood cell count could be related to norovirus/vomiting.  Will treat with IV fluids, IV Zofran  and reevaluate.  Patient feeling much better after fluids.  Flu COVID-negative, suspect likely viral gastroenteritis.  Appropriate for discharge with ODT Zofran , no indication for admission at  this time.  Return precautions discussed, she agrees to this plan      FINAL CLINICAL IMPRESSION(S) / ED DIAGNOSES   Final diagnoses:  Gastroenteritis     Rx / DC Orders   ED Discharge Orders          Ordered    ondansetron  (ZOFRAN -ODT) 4 MG disintegrating tablet  Every 8 hours PRN        02/23/24 1019             Note:  This document was prepared using Dragon voice recognition software and may include unintentional dictation errors.   Arlander Charleston, MD 02/23/24 1206  "

## 2024-02-23 NOTE — ED Notes (Signed)
 Called facility. Given number for [facility]. Per Tammy she is able to come and pick pt up and will be here in a few minutes to pick pt up.

## 2024-02-23 NOTE — ED Triage Notes (Signed)
 Pt to ED via [facility] from [facility]. EMS reports N/V/D since 2pm yesterday. Facility has had a lot of + Flu   137/55 97% RA  CBG 101 97.8 oral HR 89

## 2024-02-23 NOTE — ED Triage Notes (Signed)
 Pt reports possible flu. Endorses n/v/d, abd pain and dizziness.

## 2024-02-23 NOTE — ED Notes (Signed)
 Called both contacts x1 at this time. No answer. Voicemail left with call back number.
